# Patient Record
Sex: Female | Born: 1970 | Race: White | Hispanic: No | Smoking: Current every day smoker
Health system: Southern US, Community
[De-identification: ages and names within clinical notes are randomized; demographics above are authoritative.]

## PROBLEM LIST (undated history)

## (undated) DIAGNOSIS — K859 Acute pancreatitis without necrosis or infection, unspecified: Secondary | ICD-10-CM

## (undated) DIAGNOSIS — K259 Gastric ulcer, unspecified as acute or chronic, without hemorrhage or perforation: Secondary | ICD-10-CM

## (undated) DIAGNOSIS — F329 Major depressive disorder, single episode, unspecified: Secondary | ICD-10-CM

## (undated) DIAGNOSIS — F1010 Alcohol abuse, uncomplicated: Secondary | ICD-10-CM

## (undated) DIAGNOSIS — K219 Gastro-esophageal reflux disease without esophagitis: Secondary | ICD-10-CM

## (undated) DIAGNOSIS — K7689 Other specified diseases of liver: Secondary | ICD-10-CM

## (undated) DIAGNOSIS — D62 Acute posthemorrhagic anemia: Secondary | ICD-10-CM

## (undated) DIAGNOSIS — F32A Depression, unspecified: Secondary | ICD-10-CM

## (undated) DIAGNOSIS — IMO0001 Reserved for inherently not codable concepts without codable children: Secondary | ICD-10-CM

## (undated) DIAGNOSIS — K7010 Alcoholic hepatitis without ascites: Secondary | ICD-10-CM

## (undated) DIAGNOSIS — Z9884 Bariatric surgery status: Secondary | ICD-10-CM

## (undated) HISTORY — PX: GASTRIC BYPASS: SHX52

---

## 2008-03-08 ENCOUNTER — Other Ambulatory Visit: Admission: RE | Admit: 2008-03-08 | Discharge: 2008-03-08 | Payer: Self-pay | Admitting: Obstetrics & Gynecology

## 2008-03-30 ENCOUNTER — Ambulatory Visit (HOSPITAL_COMMUNITY): Admission: RE | Admit: 2008-03-30 | Discharge: 2008-03-30 | Payer: Self-pay | Admitting: Obstetrics & Gynecology

## 2008-04-25 ENCOUNTER — Ambulatory Visit (HOSPITAL_COMMUNITY): Admission: RE | Admit: 2008-04-25 | Discharge: 2008-04-25 | Payer: Self-pay | Admitting: Obstetrics & Gynecology

## 2008-05-05 ENCOUNTER — Ambulatory Visit (HOSPITAL_COMMUNITY): Admission: RE | Admit: 2008-05-05 | Discharge: 2008-05-05 | Payer: Self-pay | Admitting: Obstetrics & Gynecology

## 2008-06-16 ENCOUNTER — Ambulatory Visit (HOSPITAL_COMMUNITY): Admission: RE | Admit: 2008-06-16 | Discharge: 2008-06-16 | Payer: Self-pay | Admitting: Obstetrics & Gynecology

## 2009-07-12 ENCOUNTER — Emergency Department (HOSPITAL_COMMUNITY): Admission: EM | Admit: 2009-07-12 | Discharge: 2009-07-12 | Payer: Self-pay | Admitting: Emergency Medicine

## 2010-01-19 ENCOUNTER — Inpatient Hospital Stay (HOSPITAL_COMMUNITY): Admission: EM | Admit: 2010-01-19 | Discharge: 2010-01-22 | Payer: Self-pay | Admitting: Pediatrics

## 2010-02-21 ENCOUNTER — Inpatient Hospital Stay (HOSPITAL_COMMUNITY): Admission: EM | Admit: 2010-02-21 | Discharge: 2010-02-26 | Payer: Self-pay | Admitting: Emergency Medicine

## 2010-04-10 ENCOUNTER — Emergency Department (HOSPITAL_COMMUNITY): Admission: EM | Admit: 2010-04-10 | Discharge: 2010-04-10 | Payer: Self-pay | Admitting: Emergency Medicine

## 2010-06-09 ENCOUNTER — Inpatient Hospital Stay (HOSPITAL_COMMUNITY): Admission: EM | Admit: 2010-06-09 | Discharge: 2010-06-12 | Payer: Self-pay | Admitting: Emergency Medicine

## 2010-08-05 ENCOUNTER — Encounter: Payer: Self-pay | Admitting: Obstetrics & Gynecology

## 2010-09-25 LAB — LIPASE, BLOOD
Lipase: 194 U/L — ABNORMAL HIGH (ref 11–59)
Lipase: 505 U/L — ABNORMAL HIGH (ref 11–59)

## 2010-09-25 LAB — COMPREHENSIVE METABOLIC PANEL
ALT: 23 U/L (ref 0–35)
ALT: 36 U/L — ABNORMAL HIGH (ref 0–35)
ALT: 50 U/L — ABNORMAL HIGH (ref 0–35)
AST: 154 U/L — ABNORMAL HIGH (ref 0–37)
AST: 23 U/L (ref 0–37)
Albumin: 2.7 g/dL — ABNORMAL LOW (ref 3.5–5.2)
Alkaline Phosphatase: 65 U/L (ref 39–117)
Alkaline Phosphatase: 84 U/L (ref 39–117)
BUN: 9 mg/dL (ref 6–23)
CO2: 23 mEq/L (ref 19–32)
CO2: 24 mEq/L (ref 19–32)
Calcium: 8.4 mg/dL (ref 8.4–10.5)
Chloride: 101 mEq/L (ref 96–112)
Chloride: 105 mEq/L (ref 96–112)
Creatinine, Ser: 0.63 mg/dL (ref 0.4–1.2)
GFR calc Af Amer: >60 mL/min (ref >60)
GFR calc Af Amer: >60 mL/min (ref >60)
GFR calc non Af Amer: >60 mL/min (ref >60)
Glucose, Bld: 136 mg/dL — ABNORMAL HIGH (ref 70–99)
Glucose, Bld: 66 mg/dL — ABNORMAL LOW (ref 70–99)
Potassium: 3.3 mEq/L — ABNORMAL LOW (ref 3.5–5.1)
Potassium: 3.8 mEq/L (ref 3.5–5.1)
Sodium: 135 mEq/L (ref 135–145)
Sodium: 136 mEq/L (ref 135–145)
Sodium: 141 mEq/L (ref 135–145)
Total Bilirubin: 0.4 mg/dL (ref 0.3–1.2)
Total Bilirubin: 0.6 mg/dL (ref 0.3–1.2)
Total Protein: 5.5 g/dL — ABNORMAL LOW (ref 6.0–8.3)
Total Protein: 7.6 g/dL (ref 6.0–8.3)

## 2010-09-25 LAB — URINALYSIS, ROUTINE W REFLEX MICROSCOPIC
Bilirubin Urine: NEGATIVE
Glucose, UA: NEGATIVE mg/dL
Glucose, UA: NEGATIVE mg/dL
Ketones, ur: 15 mg/dL — AB
Leukocytes, UA: NEGATIVE
Nitrite: POSITIVE — AB
Protein, ur: NEGATIVE mg/dL
Specific Gravity, Urine: >1.03 — ABNORMAL HIGH (ref 1.005–1.030)
pH: 6 (ref 5.0–8.0)
pH: 6 (ref 5.0–8.0)

## 2010-09-25 LAB — MRSA PCR SCREENING: MRSA by PCR: NEGATIVE

## 2010-09-25 LAB — DIFFERENTIAL
Basophils Absolute: 0 10*3/uL (ref 0.0–0.1)
Basophils Absolute: 0 10*3/uL (ref 0.0–0.1)
Basophils Relative: 0 % (ref 0–1)
Basophils Relative: 1 % (ref 0–1)
Eosinophils Absolute: 0 10*3/uL (ref 0.0–0.7)
Eosinophils Absolute: 0.2 10*3/uL (ref 0.0–0.7)
Eosinophils Relative: 3 % (ref 0–5)
Lymphs Abs: 1.5 10*3/uL (ref 0.7–4.0)
Monocytes Absolute: 0.7 10*3/uL (ref 0.1–1.0)
Monocytes Relative: 7 % (ref 3–12)
Neutro Abs: 9 10*3/uL — ABNORMAL HIGH (ref 1.7–7.7)
Neutrophils Relative %: 86 % — ABNORMAL HIGH (ref 43–77)

## 2010-09-25 LAB — CBC
HCT: 37.4 % (ref 36.0–46.0)
HCT: 39.8 % (ref 36.0–46.0)
Hemoglobin: 12.3 g/dL (ref 12.0–15.0)
Hemoglobin: 13.7 g/dL (ref 12.0–15.0)
MCV: 87.1 fL (ref 78.0–100.0)
RBC: 4.18 MIL/uL (ref 3.87–5.11)
RBC: 4.57 MIL/uL (ref 3.87–5.11)
WBC: 10.5 10*3/uL (ref 4.0–10.5)

## 2010-09-25 LAB — URINE MICROSCOPIC-ADD ON

## 2010-09-25 LAB — RAPID URINE DRUG SCREEN, HOSP PERFORMED
Cocaine: NOT DETECTED
Tetrahydrocannabinol: NOT DETECTED

## 2010-09-27 LAB — DIFFERENTIAL
Eosinophils Relative: 1 % (ref 0–5)
Lymphs Abs: 2.1 10*3/uL (ref 0.7–4.0)
Monocytes Absolute: 0.4 10*3/uL (ref 0.1–1.0)

## 2010-09-27 LAB — CBC
HCT: 35.1 % — ABNORMAL LOW (ref 36.0–46.0)
MCHC: 33 g/dL (ref 30.0–36.0)
Platelets: 522 10*3/uL — ABNORMAL HIGH (ref 150–400)
RDW: 23.1 % — ABNORMAL HIGH (ref 11.5–15.5)

## 2010-09-27 LAB — RAPID URINE DRUG SCREEN, HOSP PERFORMED
Amphetamines: NOT DETECTED
Benzodiazepines: NOT DETECTED
Cocaine: NOT DETECTED
Opiates: NOT DETECTED
Tetrahydrocannabinol: POSITIVE — AB

## 2010-09-27 LAB — BASIC METABOLIC PANEL
BUN: 3 mg/dL — ABNORMAL LOW (ref 6–23)
Calcium: 8.7 mg/dL (ref 8.4–10.5)
Creatinine, Ser: 0.66 mg/dL (ref 0.4–1.2)
GFR calc non Af Amer: >60 mL/min (ref >60)
Glucose, Bld: 88 mg/dL (ref 70–99)
Potassium: 3.3 mEq/L — ABNORMAL LOW (ref 3.5–5.1)

## 2010-09-28 LAB — DIFFERENTIAL
Basophils Absolute: 0 K/uL (ref 0.0–0.1)
Basophils Absolute: 0 10*3/uL (ref 0.0–0.1)
Basophils Absolute: 0.1 10*3/uL (ref 0.0–0.1)
Basophils Relative: 0 % (ref 0–1)
Eosinophils Absolute: 0.3 K/uL (ref 0.0–0.7)
Eosinophils Absolute: 0.2 10*3/uL (ref 0.0–0.7)
Eosinophils Absolute: 0.3 10*3/uL (ref 0.0–0.7)
Eosinophils Relative: 3 % (ref 0–5)
Eosinophils Relative: 1 % (ref 0–5)
Eosinophils Relative: 3 % (ref 0–5)
Eosinophils Relative: 4 % (ref 0–5)
Lymphocytes Relative: 25 % (ref 12–46)
Lymphocytes Relative: 22 % (ref 12–46)
Lymphocytes Relative: 7 % — ABNORMAL LOW (ref 12–46)
Lymphs Abs: 2.1 K/uL (ref 0.7–4.0)
Lymphs Abs: 2 10*3/uL (ref 0.7–4.0)
Monocytes Absolute: 0.8 K/uL (ref 0.1–1.0)
Monocytes Absolute: 1.1 10*3/uL — ABNORMAL HIGH (ref 0.1–1.0)
Monocytes Relative: 10 % (ref 3–12)
Monocytes Relative: 10 % (ref 3–12)
Monocytes Relative: 10 % (ref 3–12)
Neutro Abs: 5.3 K/uL (ref 1.7–7.7)
Neutrophils Relative %: 62 % (ref 43–77)

## 2010-09-28 LAB — BASIC METABOLIC PANEL
BUN: 4 mg/dL — ABNORMAL LOW (ref 6–23)
BUN: 5 mg/dL — ABNORMAL LOW (ref 6–23)
CO2: 26 mEq/L (ref 19–32)
CO2: 23 mEq/L (ref 19–32)
CO2: 25 mEq/L (ref 19–32)
Calcium: 8.2 mg/dL — ABNORMAL LOW (ref 8.4–10.5)
Chloride: 106 mEq/L (ref 96–112)
Chloride: 105 mEq/L (ref 96–112)
Chloride: 105 mEq/L (ref 96–112)
Creatinine, Ser: 0.68 mg/dL (ref 0.4–1.2)
Creatinine, Ser: 0.53 mg/dL (ref 0.4–1.2)
Creatinine, Ser: 0.53 mg/dL (ref 0.4–1.2)
GFR calc Af Amer: >60 mL/min (ref >60)
GFR calc Af Amer: >60 mL/min (ref >60)
GFR calc non Af Amer: >60 mL/min (ref >60)
Glucose, Bld: 72 mg/dL (ref 70–99)
Glucose, Bld: 84 mg/dL (ref 70–99)
Potassium: 3.7 mEq/L (ref 3.5–5.1)
Sodium: 136 mEq/L (ref 135–145)
Sodium: 137 mEq/L (ref 135–145)

## 2010-09-28 LAB — CROSSMATCH: Antibody Screen: NEGATIVE

## 2010-09-28 LAB — CBC
HCT: 31.5 % — ABNORMAL LOW (ref 36.0–46.0)
HCT: 24.9 % — ABNORMAL LOW (ref 36.0–46.0)
HCT: 31.6 % — ABNORMAL LOW (ref 36.0–46.0)
HCT: 32.1 % — ABNORMAL LOW (ref 36.0–46.0)
Hemoglobin: 10.2 g/dL — ABNORMAL LOW (ref 12.0–15.0)
Hemoglobin: 10.2 g/dL — ABNORMAL LOW (ref 12.0–15.0)
Hemoglobin: 8 g/dL — ABNORMAL LOW (ref 12.0–15.0)
MCH: 27.6 pg (ref 26.0–34.0)
MCH: 26.4 pg (ref 26.0–34.0)
MCH: 27.4 pg (ref 26.0–34.0)
MCHC: 32.5 g/dL (ref 30.0–36.0)
MCHC: 32 g/dL (ref 30.0–36.0)
MCV: 84.8 fL (ref 78.0–100.0)
MCV: 82.7 fL (ref 78.0–100.0)
MCV: 82.8 fL (ref 78.0–100.0)
MCV: 84.7 fL (ref 78.0–100.0)
Platelets: 987 K/uL (ref 150–400)
Platelets: 529 K/uL — ABNORMAL HIGH (ref 150–400)
RBC: 3.72 MIL/uL — ABNORMAL LOW (ref 3.87–5.11)
RBC: 3.01 MIL/uL — ABNORMAL LOW (ref 3.87–5.11)
RBC: 3.73 MIL/uL — ABNORMAL LOW (ref 3.87–5.11)
RDW: 23 % — ABNORMAL HIGH (ref 11.5–15.5)
RDW: 23 % — ABNORMAL HIGH (ref 11.5–15.5)
RDW: 23.3 % — ABNORMAL HIGH (ref 11.5–15.5)
WBC: 8.6 K/uL (ref 4.0–10.5)
WBC: 11.8 10*3/uL — ABNORMAL HIGH (ref 4.0–10.5)
WBC: 8.7 K/uL (ref 4.0–10.5)
WBC: 8.9 10*3/uL (ref 4.0–10.5)

## 2010-09-28 LAB — URINE MICROSCOPIC-ADD ON

## 2010-09-28 LAB — URINALYSIS, ROUTINE W REFLEX MICROSCOPIC
Hgb urine dipstick: NEGATIVE
Urobilinogen, UA: >8 mg/dL — ABNORMAL HIGH (ref 0.0–1.0)

## 2010-09-28 LAB — HEPATIC FUNCTION PANEL
ALT: 16 U/L (ref 0–35)
AST: 33 U/L (ref 0–37)
Albumin: 2.6 g/dL — ABNORMAL LOW (ref 3.5–5.2)
Alkaline Phosphatase: 105 U/L (ref 39–117)
Bilirubin, Direct: 0.2 mg/dL (ref 0.0–0.3)
Indirect Bilirubin: 0.2 mg/dL — ABNORMAL LOW (ref 0.3–0.9)
Total Bilirubin: 0.4 mg/dL (ref 0.3–1.2)
Total Protein: 7.2 g/dL (ref 6.0–8.3)

## 2010-09-28 LAB — AMYLASE: Amylase: 291 U/L — ABNORMAL HIGH (ref 0–105)

## 2010-09-28 LAB — MAGNESIUM: Magnesium: 1.9 mg/dL (ref 1.5–2.5)

## 2010-09-28 LAB — BASIC METABOLIC PANEL WITH GFR
BUN: 5 mg/dL — ABNORMAL LOW (ref 6–23)
CO2: 25 meq/L (ref 19–32)
Calcium: 8.4 mg/dL (ref 8.4–10.5)
Chloride: 101 meq/L (ref 96–112)
Creatinine, Ser: 0.61 mg/dL (ref 0.4–1.2)
GFR calc non Af Amer: >60 mL/min
Glucose, Bld: 102 mg/dL — ABNORMAL HIGH (ref 70–99)
Potassium: 3.3 meq/L — ABNORMAL LOW (ref 3.5–5.1)
Sodium: 135 meq/L (ref 135–145)

## 2010-09-28 LAB — HEMOCCULT GUIAC POC 1CARD (OFFICE)
Fecal Occult Bld: NEGATIVE
Fecal Occult Bld: NEGATIVE

## 2010-09-28 LAB — HEMOGLOBIN AND HEMATOCRIT, BLOOD
HCT: 25.5 % — ABNORMAL LOW (ref 36.0–46.0)
Hemoglobin: 8.2 g/dL — ABNORMAL LOW (ref 12.0–15.0)

## 2010-09-28 LAB — PREGNANCY, URINE: Preg Test, Ur: NEGATIVE

## 2010-09-28 LAB — IRON AND TIBC: Saturation Ratios: 5 % — ABNORMAL LOW (ref 20–55)

## 2010-09-28 LAB — PATHOLOGIST SMEAR REVIEW

## 2010-09-28 LAB — LIPASE, BLOOD
Lipase: 355 U/L — ABNORMAL HIGH (ref 11–59)
Lipase: 184 U/L — ABNORMAL HIGH (ref 11–59)

## 2010-09-30 LAB — CBC
HCT: 26.9 % — ABNORMAL LOW (ref 36.0–46.0)
HCT: 33 % — ABNORMAL LOW (ref 36.0–46.0)
HCT: 33.3 % — ABNORMAL LOW (ref 36.0–46.0)
HCT: 34.5 % — ABNORMAL LOW (ref 36.0–46.0)
Hemoglobin: 10.8 g/dL — ABNORMAL LOW (ref 12.0–15.0)
Hemoglobin: 10.8 g/dL — ABNORMAL LOW (ref 12.0–15.0)
Hemoglobin: 8.9 g/dL — ABNORMAL LOW (ref 12.0–15.0)
MCH: 26.8 pg (ref 26.0–34.0)
MCH: 26.9 pg (ref 26.0–34.0)
MCH: 27 pg (ref 26.0–34.0)
MCHC: 32.6 g/dL (ref 30.0–36.0)
MCHC: 32.6 g/dL (ref 30.0–36.0)
MCHC: 32.9 g/dL (ref 30.0–36.0)
MCV: 82.1 fL (ref 78.0–100.0)
MCV: 82.2 fL (ref 78.0–100.0)
MCV: 82.6 fL (ref 78.0–100.0)
Platelets: 226 10*3/uL (ref 150–400)
Platelets: 245 10*3/uL (ref 150–400)
RBC: 3.28 MIL/uL — ABNORMAL LOW (ref 3.87–5.11)
RBC: 4.03 MIL/uL (ref 3.87–5.11)
RDW: 23.2 % — ABNORMAL HIGH (ref 11.5–15.5)
RDW: 23.5 % — ABNORMAL HIGH (ref 11.5–15.5)
RDW: 23.7 % — ABNORMAL HIGH (ref 11.5–15.5)
WBC: 10.5 10*3/uL (ref 4.0–10.5)
WBC: 14.1 10*3/uL — ABNORMAL HIGH (ref 4.0–10.5)

## 2010-09-30 LAB — TRIGLYCERIDES: Triglycerides: 58 mg/dL (ref <150)

## 2010-09-30 LAB — GLUCOSE, CAPILLARY
Glucose-Capillary: 106 mg/dL — ABNORMAL HIGH (ref 70–99)
Glucose-Capillary: 108 mg/dL — ABNORMAL HIGH (ref 70–99)
Glucose-Capillary: 75 mg/dL (ref 70–99)

## 2010-09-30 LAB — COMPREHENSIVE METABOLIC PANEL
ALT: 10 U/L (ref 0–35)
AST: 18 U/L (ref 0–37)
Albumin: 3.3 g/dL — ABNORMAL LOW (ref 3.5–5.2)
Alkaline Phosphatase: 64 U/L (ref 39–117)
Alkaline Phosphatase: 71 U/L (ref 39–117)
Alkaline Phosphatase: 77 U/L (ref 39–117)
BUN: 4 mg/dL — ABNORMAL LOW (ref 6–23)
BUN: 7 mg/dL (ref 6–23)
CO2: 18 mEq/L — ABNORMAL LOW (ref 19–32)
CO2: 20 mEq/L (ref 19–32)
Calcium: 8 mg/dL — ABNORMAL LOW (ref 8.4–10.5)
Calcium: 8.3 mg/dL — ABNORMAL LOW (ref 8.4–10.5)
Chloride: 107 mEq/L (ref 96–112)
Creatinine, Ser: 0.65 mg/dL (ref 0.4–1.2)
Creatinine, Ser: 0.72 mg/dL (ref 0.4–1.2)
GFR calc Af Amer: >60 mL/min (ref >60)
GFR calc non Af Amer: >60 mL/min (ref >60)
Glucose, Bld: 135 mg/dL — ABNORMAL HIGH (ref 70–99)
Glucose, Bld: 194 mg/dL — ABNORMAL HIGH (ref 70–99)
Glucose, Bld: 88 mg/dL (ref 70–99)
Potassium: 2.9 mEq/L — ABNORMAL LOW (ref 3.5–5.1)
Sodium: 134 mEq/L — ABNORMAL LOW (ref 135–145)
Total Bilirubin: 0.9 mg/dL (ref 0.3–1.2)
Total Protein: 6.3 g/dL (ref 6.0–8.3)
Total Protein: 6.9 g/dL (ref 6.0–8.3)

## 2010-09-30 LAB — BASIC METABOLIC PANEL
BUN: 4 mg/dL — ABNORMAL LOW (ref 6–23)
BUN: 5 mg/dL — ABNORMAL LOW (ref 6–23)
CO2: 20 mEq/L (ref 19–32)
CO2: 20 mEq/L (ref 19–32)
Calcium: 7.9 mg/dL — ABNORMAL LOW (ref 8.4–10.5)
Calcium: 8.3 mg/dL — ABNORMAL LOW (ref 8.4–10.5)
Chloride: 105 mEq/L (ref 96–112)
Chloride: 105 mEq/L (ref 96–112)
Creatinine, Ser: 0.65 mg/dL (ref 0.4–1.2)
Creatinine, Ser: 0.66 mg/dL (ref 0.4–1.2)
GFR calc Af Amer: >60 mL/min (ref >60)
GFR calc Af Amer: >60 mL/min (ref >60)
GFR calc non Af Amer: >60 mL/min (ref >60)
GFR calc non Af Amer: >60 mL/min (ref >60)
Glucose, Bld: 66 mg/dL — ABNORMAL LOW (ref 70–99)
Glucose, Bld: 86 mg/dL (ref 70–99)
Potassium: 4.1 mEq/L (ref 3.5–5.1)
Potassium: 4.5 mEq/L (ref 3.5–5.1)
Sodium: 130 mEq/L — ABNORMAL LOW (ref 135–145)
Sodium: 131 mEq/L — ABNORMAL LOW (ref 135–145)

## 2010-09-30 LAB — DIFFERENTIAL
Basophils Absolute: 0 10*3/uL (ref 0.0–0.1)
Basophils Absolute: 0 10*3/uL (ref 0.0–0.1)
Basophils Relative: 0 % (ref 0–1)
Basophils Relative: 0 % (ref 0–1)
Basophils Relative: 0 % (ref 0–1)
Eosinophils Absolute: 0 10*3/uL (ref 0.0–0.7)
Eosinophils Absolute: 0 10*3/uL (ref 0.0–0.7)
Eosinophils Absolute: 0 10*3/uL (ref 0.0–0.7)
Eosinophils Relative: 0 % (ref 0–5)
Eosinophils Relative: 0 % (ref 0–5)
Lymphocytes Relative: 10 % — ABNORMAL LOW (ref 12–46)
Lymphocytes Relative: 3 % — ABNORMAL LOW (ref 12–46)
Lymphocytes Relative: 4 % — ABNORMAL LOW (ref 12–46)
Lymphs Abs: 0.6 10*3/uL — ABNORMAL LOW (ref 0.7–4.0)
Lymphs Abs: 1 10*3/uL (ref 0.7–4.0)
Lymphs Abs: 1 10*3/uL (ref 0.7–4.0)
Monocytes Absolute: 0.6 10*3/uL (ref 0.1–1.0)
Monocytes Absolute: 0.9 10*3/uL (ref 0.1–1.0)
Monocytes Relative: 4 % (ref 3–12)
Monocytes Relative: 5 % (ref 3–12)
Monocytes Relative: 6 % (ref 3–12)
Monocytes Relative: 9 % (ref 3–12)
Neutro Abs: 12.1 10*3/uL — ABNORMAL HIGH (ref 1.7–7.7)
Neutro Abs: 8.5 10*3/uL — ABNORMAL HIGH (ref 1.7–7.7)
Neutrophils Relative %: 81 % — ABNORMAL HIGH (ref 43–77)
Neutrophils Relative %: 89 % — ABNORMAL HIGH (ref 43–77)
Neutrophils Relative %: 92 % — ABNORMAL HIGH (ref 43–77)

## 2010-09-30 LAB — RAPID URINE DRUG SCREEN, HOSP PERFORMED
Amphetamines: NOT DETECTED
Barbiturates: NOT DETECTED
Benzodiazepines: POSITIVE — AB
Cocaine: NOT DETECTED
Opiates: POSITIVE — AB
Tetrahydrocannabinol: NOT DETECTED

## 2010-09-30 LAB — LIPASE, BLOOD
Lipase: 134 U/L — ABNORMAL HIGH (ref 11–59)
Lipase: 665 U/L — ABNORMAL HIGH (ref 11–59)
Lipase: 82 U/L — ABNORMAL HIGH (ref 11–59)

## 2010-09-30 LAB — TSH: TSH: 1.751 u[IU]/mL (ref 0.350–4.500)

## 2010-10-15 LAB — RAPID URINE DRUG SCREEN, HOSP PERFORMED
Amphetamines: NOT DETECTED
Benzodiazepines: NOT DETECTED
Cocaine: NOT DETECTED

## 2010-10-15 LAB — BASIC METABOLIC PANEL
BUN: 6 mg/dL (ref 6–23)
CO2: 24 mEq/L (ref 19–32)
Calcium: 9 mg/dL (ref 8.4–10.5)
Chloride: 102 mEq/L (ref 96–112)
Creatinine, Ser: 0.83 mg/dL (ref 0.4–1.2)
GFR calc Af Amer: >60 mL/min (ref >60)

## 2010-10-15 LAB — ETHANOL: Alcohol, Ethyl (B): <5 mg/dL (ref 0–10)

## 2010-10-15 LAB — DIFFERENTIAL
Basophils Relative: 1 % (ref 0–1)
Eosinophils Absolute: 0 10*3/uL (ref 0.0–0.7)
Monocytes Relative: 10 % (ref 3–12)
Neutrophils Relative %: 65 % (ref 43–77)

## 2010-10-15 LAB — HEPATIC FUNCTION PANEL
Albumin: 3.7 g/dL (ref 3.5–5.2)
Bilirubin, Direct: 0.1 mg/dL (ref 0.0–0.3)
Total Bilirubin: 0.6 mg/dL (ref 0.3–1.2)

## 2010-10-15 LAB — CBC
MCHC: 32.2 g/dL (ref 30.0–36.0)
MCV: 75.3 fL — ABNORMAL LOW (ref 78.0–100.0)
RBC: 4.21 MIL/uL (ref 3.87–5.11)
RDW: 19 % — ABNORMAL HIGH (ref 11.5–15.5)

## 2010-10-15 LAB — PREGNANCY, URINE: Preg Test, Ur: NEGATIVE

## 2013-03-10 ENCOUNTER — Emergency Department (HOSPITAL_COMMUNITY): Payer: Self-pay

## 2013-03-10 ENCOUNTER — Encounter (HOSPITAL_COMMUNITY): Payer: Self-pay | Admitting: Emergency Medicine

## 2013-03-10 ENCOUNTER — Emergency Department (HOSPITAL_COMMUNITY)
Admission: EM | Admit: 2013-03-10 | Discharge: 2013-03-11 | Disposition: A | Discharge status: Home / Self Care | Payer: Self-pay | Attending: Emergency Medicine | Admitting: Emergency Medicine

## 2013-03-10 DIAGNOSIS — L02811 Cutaneous abscess of head [any part, except face]: Secondary | ICD-10-CM

## 2013-03-10 DIAGNOSIS — Z79899 Other long term (current) drug therapy: Secondary | ICD-10-CM | POA: Insufficient documentation

## 2013-03-10 DIAGNOSIS — R209 Unspecified disturbances of skin sensation: Secondary | ICD-10-CM | POA: Insufficient documentation

## 2013-03-10 DIAGNOSIS — S52023A Displaced fracture of olecranon process without intraarticular extension of unspecified ulna, initial encounter for closed fracture: Secondary | ICD-10-CM | POA: Insufficient documentation

## 2013-03-10 DIAGNOSIS — S52022A Displaced fracture of olecranon process without intraarticular extension of left ulna, initial encounter for closed fracture: Secondary | ICD-10-CM

## 2013-03-10 DIAGNOSIS — Y9289 Other specified places as the place of occurrence of the external cause: Secondary | ICD-10-CM | POA: Insufficient documentation

## 2013-03-10 DIAGNOSIS — W1809XA Striking against other object with subsequent fall, initial encounter: Secondary | ICD-10-CM | POA: Insufficient documentation

## 2013-03-10 DIAGNOSIS — S0100XA Unspecified open wound of scalp, initial encounter: Secondary | ICD-10-CM | POA: Insufficient documentation

## 2013-03-10 DIAGNOSIS — Y93E1 Activity, personal bathing and showering: Secondary | ICD-10-CM | POA: Insufficient documentation

## 2013-03-10 DIAGNOSIS — L02818 Cutaneous abscess of other sites: Secondary | ICD-10-CM | POA: Insufficient documentation

## 2013-03-10 DIAGNOSIS — F17200 Nicotine dependence, unspecified, uncomplicated: Secondary | ICD-10-CM | POA: Insufficient documentation

## 2013-03-10 HISTORY — DX: Bariatric surgery status: Z98.84

## 2013-03-10 MED ORDER — OXYCODONE-ACETAMINOPHEN 5-325 MG PO TABS: 1.0000 | ORAL_TABLET | Freq: Once | ORAL | Status: DC

## 2013-03-10 MED ORDER — LIDOCAINE-EPINEPHRINE (PF) 2 %-1:200000 IJ SOLN
INTRAMUSCULAR | Status: AC
  Filled 2013-03-10: qty 20

## 2013-03-10 MED ORDER — ACETAMINOPHEN-CODEINE #3 300-30 MG PO TABS
1.0000 | ORAL_TABLET | Freq: Once | ORAL | Status: AC
  Administered 2013-03-11: 1 via ORAL
  Filled 2013-03-10: qty 1

## 2013-03-10 MED ORDER — SULFAMETHOXAZOLE-TRIMETHOPRIM 800-160 MG PO TABS: 2.0000 | ORAL_TABLET | Freq: Two times a day (BID) | ORAL | Status: DC

## 2013-03-10 MED ORDER — ACETAMINOPHEN-CODEINE #3 300-30 MG PO TABS: 1.0000 | ORAL_TABLET | Freq: Four times a day (QID) | ORAL | Status: DC | PRN

## 2013-03-10 MED ORDER — OXYCODONE-ACETAMINOPHEN 5-325 MG PO TABS: 1.0000 | ORAL_TABLET | Freq: Four times a day (QID) | ORAL | Status: DC | PRN

## 2013-03-10 MED ORDER — CEPHALEXIN 500 MG PO CAPS: 500.0000 mg | ORAL_CAPSULE | Freq: Four times a day (QID) | ORAL | Status: DC

## 2013-03-10 NOTE — ED Notes (Signed)
Pt states she fell 3 weeks ago on her left arm and hit her head. Pt states she had loc after fall.

## 2013-03-10 NOTE — ED Notes (Signed)
Pt c/o left elbow pain and left side head pain x3 weeks. Pt states she fell 3 weeks ago. Pt reports LOC after fall. Pt also reports intermittent dizziness since fall. Pt denies nausea, weakness, lightheadedness.

## 2013-03-10 NOTE — ED Provider Notes (Signed)
CSN: [id number]     Arrival date & time 03/10/13  1959 History  This chart was scribed for [employer]. Rubin Payor, MD by Dorothey Baseman, ED Scribe. This patient was seen in room APA12/[facility]12 and the patient's care was started at 9:55 PM.    Chief Complaint  Patient presents with  . Headache  . Arm Pain   The history is provided by the patient. No language interpreter was used.   HPI Comments: Jennifer [name] is a 42 y.o. female who presents to the Emergency Department complaining of left, lower arm pain that is gradually worsening and described as aching, secondary to a fall that occurred 3 weeks ago. Patient reports the fall occurred while she was in the shower and had been drinking. Patient reports associated paresthesias of her left lower arm and a cut to her head that has been draining onset after the fall. Patient denies numbness in her left hand. Patient denies any relevant medical history. Patient is a current every day smoker.  Past Medical History  Diagnosis Date  . Gastric bypass status for obesity    Past Surgical History  Procedure Laterality Date  . Gastric bypass     History reviewed. No pertinent family history. History  Substance Use Topics  . Smoking status: Current Every Day Smoker  . Smokeless tobacco: Not on file  . Alcohol Use: Yes   OB History   Grav Para Term Preterm Abortions TAB SAB Ect Mult Living                 Review of Systems  Constitutional: Negative for fever.  Musculoskeletal: Positive for myalgias.       Left lower arm.  Skin: Positive for wound.       Left parietal scalp.   Neurological: Negative for dizziness, syncope and numbness.  Psychiatric/Behavioral: Negative for confusion.  All other systems reviewed and are negative.    Allergies  Aspirin  Home Medications   Current Outpatient Rx  Name  Route  Sig  Dispense  Refill  . acetaminophen-codeine (TYLENOL #3) 300-30 MG per tablet   Oral   Take 1-2 tablets by mouth every 6 (six) hours  as needed for pain.   15 tablet   0   . cephALEXin (KEFLEX) 500 MG capsule   Oral   Take 1 capsule (500 mg total) by mouth 4 (four) times daily.   20 capsule   0   . oxyCODONE-acetaminophen (PERCOCET/ROXICET) 5-325 MG per tablet   Oral   Take 1-2 tablets by mouth every 6 (six) hours as needed for pain.   10 tablet   0   . sulfamethoxazole-trimethoprim (BACTRIM DS,SEPTRA DS) 800-160 MG per tablet   Oral   Take 2 tablets by mouth 2 (two) times daily.   20 tablet   0     Triage Vitals: BP 112/52  Pulse 71  Temp(Src) 98.2 F (36.8 C) (Oral)  Resp 20  Ht 5\' 8"  (1.727 m)  Wt 162 lb (73.483 kg)  BMI 24.64 kg/m2  SpO2 100%  Physical Exam  Nursing note and vitals reviewed. Constitutional: She is oriented to person, place, and time. She appears well-developed and well-nourished. No distress.  HENT:  Head: Normocephalic.  1.5 cm x 4 cm raised area to the left parietal with purulent drainage when pressure is applied. No erythema. No tenderness.  Eyes: Conjunctivae are normal.  Neck: Normal range of motion.  Cardiovascular: Normal rate, regular rhythm and normal heart sounds.  Pulmonary/Chest: Effort normal.  Musculoskeletal: She exhibits tenderness.  Tenderness to palpation and limited ROM of the left elbow.  Neurological: She is alert and oriented to person, place, and time.  Skin: Skin is warm and dry.  Psychiatric: She has a normal mood and affect. Her behavior is normal.    ED Course  Procedures (including critical care time)   COORDINATION OF CARE: 10:04PM- Applied pressure to area on scalp to facilitate pus drainage. Will order elbow x-ray and head CT scan. Discussed treatment plan with patient at bedside and patient verbalized agreement.   Labs Review Labs Reviewed - No data to display  Imaging Review  Dg Elbow Complete Left  03/10/2013   *RADIOLOGY REPORT*  Clinical Data: Traumatic injury and pain 3 weeks previous  LEFT ELBOW - COMPLETE 3+ VIEW  Comparison:  None.  Findings: There is a mildly displaced fracture through the olecranon process.  No dislocation is noted.  A few small fragments are noted medial to the fracture site. The joint effusion is noted.  IMPRESSION: Olecranon process fracture   Original Report Authenticated By: Alcide Clever, M.D.   Ct Head Wo Contrast  03/10/2013   *RADIOLOGY REPORT*  Clinical Data: Headache, post remote fall, now with draining wound  CT HEAD WITHOUT CONTRAST  Technique:  Contiguous axial images were obtained from the base of the skull through the vertex without contrast.  Comparison: None.  Findings:  There is an approximately 1.1 x 3.6 cm area of soft tissue stranding about the left parietal calvarium (image 21, series 2). There is minimal ill-defined decreased attenuation of the central aspect of the soft tissue stranding (representative images 21 and 19) which may represent fluid.  There is no definitive osteolysis of the adjacent calvarium.  There are two adjacent punctate radiopaque foci about the caudal aspect of this soft tissue stranding (image 18, series 2) which may represent foreign bodies.  Gray white differentiation is maintained.  No CT evidence of acute large territory infarct.  No intraparenchymal or extra-axial mass or hemorrhage.  Normal size and configuration of the ventricles and basilar cisterns.  No midline shift.  Limited visualization of the paranasal sinuses and mastoid air cells are normal.  No displaced calvarial fracture.  IMPRESSION: Soft tissue stranding with central decreased attenuation about the left parietal calvarium which given provided history of draining wound is worrisome for area of infection.  Additionally, there are two punctate radiopaque foreign bodies within the caudal aspect of the soft tissue stranding may represent radiopaque foreign bodies. No evidence of osteomyelitis or acute intracranial process.   Original Report Authenticated By: Tacey Ruiz, MD   INCISION AND  DRAINAGE Performed by: Billee Cashing Consent: Verbal consent obtained. Risks and benefits: risks, benefits and alternatives were discussed Type: abscess  Body area: Scalp  Anesthesia: local infiltration  Incision was made with a scalpel.  Local anesthetic: lidocaine 2 % with epinephrine  Anesthetic total: 2 and ml  Complexity: Simple   Drainage: None   Patient tolerance: Patient tolerated the procedure well with no immediate complications.   MDM   1. Closed olecranon process fracture, left, initial encounter   2. Scalp abscess    Patient with elbow injury from 3 weeks ago. Patient states that she got drunk and fell. hAs olecranon fracture. His been immobilized and will follow with ortho. Also has scalp abscess. Manually drained with pressure. Then scalpel incision was made without return of purulent material. Will give antibiotics. Will need to follow with her PCP  for the abscess  Will I personally performed the services described in this documentation, which was scribed in my presence. The recorded information has been reviewed and is accurate.     Juliet Rude. Rubin Payor, MD 03/11/13 1478

## 2013-03-11 MED ORDER — CEPHALEXIN 500 MG PO CAPS: 500.0000 mg | ORAL_CAPSULE | Freq: Four times a day (QID) | ORAL | Status: DC

## 2013-03-11 MED ORDER — SULFAMETHOXAZOLE-TRIMETHOPRIM 800-160 MG PO TABS: 2.0000 | ORAL_TABLET | Freq: Two times a day (BID) | ORAL | Status: DC

## 2013-03-19 ENCOUNTER — Telehealth: Payer: Self-pay | Admitting: Orthopedic Surgery

## 2013-03-19 NOTE — Telephone Encounter (Signed)
ok 

## 2013-03-19 NOTE — Telephone Encounter (Signed)
Patient called today, 03/19/13, had been seen at [facility] Emergency Room 03/10/13, for problem fracture of left elbow.  Emergency room physician notes indicate that patient had a fall 3 weeks prior to the Emergency Room visit.  (Copy of Xray report indicates :  "*RADIOLOGY REPORT*  Clinical Data: Traumatic injury and pain 3 weeks previous  LEFT ELBOW - COMPLETE 3+ VIEW  Comparison: None.  Findings: There is a mildly displaced fracture through the  olecranon process. No dislocation is noted. A few small fragments  are noted medial to the fracture site. The joint effusion is noted.  IMPRESSION:  Olecranon process fracture   - Patient states has no insurance; reviewed protocol regarding self-pay, and also provided patient with other insurance information to inquire, and she said she will need to call back regarding working out payment arrangements.  *Follow up if no response by Monday 03/22/13.

## 2013-03-25 NOTE — Telephone Encounter (Signed)
Called back to follow up, as no response from patient.  Person at contact ph# [phone] states she is no longer there.  Mentioned "probation."  States no way to contact her.

## 2013-04-20 ENCOUNTER — Encounter (HOSPITAL_COMMUNITY): Payer: Self-pay | Admitting: *Deleted

## 2013-04-20 ENCOUNTER — Emergency Department (HOSPITAL_COMMUNITY)
Admission: EM | Admit: 2013-04-20 | Discharge: 2013-04-20 | Disposition: A | Discharge status: Home / Self Care | Payer: Self-pay | Attending: Emergency Medicine | Admitting: Emergency Medicine

## 2013-04-20 DIAGNOSIS — F3289 Other specified depressive episodes: Secondary | ICD-10-CM | POA: Insufficient documentation

## 2013-04-20 DIAGNOSIS — Z9884 Bariatric surgery status: Secondary | ICD-10-CM | POA: Insufficient documentation

## 2013-04-20 DIAGNOSIS — Z79899 Other long term (current) drug therapy: Secondary | ICD-10-CM | POA: Insufficient documentation

## 2013-04-20 DIAGNOSIS — R109 Unspecified abdominal pain: Secondary | ICD-10-CM | POA: Insufficient documentation

## 2013-04-20 DIAGNOSIS — F17200 Nicotine dependence, unspecified, uncomplicated: Secondary | ICD-10-CM | POA: Insufficient documentation

## 2013-04-20 DIAGNOSIS — F329 Major depressive disorder, single episode, unspecified: Secondary | ICD-10-CM | POA: Insufficient documentation

## 2013-04-20 DIAGNOSIS — G5600 Carpal tunnel syndrome, unspecified upper limb: Secondary | ICD-10-CM | POA: Insufficient documentation

## 2013-04-20 DIAGNOSIS — M25549 Pain in joints of unspecified hand: Secondary | ICD-10-CM | POA: Insufficient documentation

## 2013-04-20 DIAGNOSIS — R209 Unspecified disturbances of skin sensation: Secondary | ICD-10-CM | POA: Insufficient documentation

## 2013-04-20 DIAGNOSIS — Z8719 Personal history of other diseases of the digestive system: Secondary | ICD-10-CM | POA: Insufficient documentation

## 2013-04-20 DIAGNOSIS — G5601 Carpal tunnel syndrome, right upper limb: Secondary | ICD-10-CM

## 2013-04-20 HISTORY — DX: Acute pancreatitis without necrosis or infection, unspecified: K85.90

## 2013-04-20 HISTORY — DX: Depression, unspecified: F32.A

## 2013-04-20 HISTORY — DX: Major depressive disorder, single episode, unspecified: F32.9

## 2013-04-20 MED ORDER — HYDROCODONE-ACETAMINOPHEN 5-325 MG PO TABS
2.0000 | ORAL_TABLET | Freq: Once | ORAL | Status: AC
  Administered 2013-04-20: 2 via ORAL
  Filled 2013-04-20: qty 2

## 2013-04-20 NOTE — ED Provider Notes (Signed)
CSN: [id number]     Arrival date & time 04/20/13  1044 History   First MD Initiated Contact with Patient 04/20/13 1101     Chief Complaint  Patient presents with  . Elbow Pain  . Hand Pain   (Consider location/radiation/quality/duration/timing/severity/associated sxs/prior Treatment) Patient is a 42 y.o. female presenting with hand pain. The history is provided by the patient.  Hand Pain This is a new problem. The current episode started in the past 7 days. The problem occurs daily. The problem has been gradually worsening. Associated symptoms include abdominal pain, arthralgias, joint swelling and numbness. Pertinent negatives include no chest pain, coughing, fever, neck pain or weakness. Exacerbated by: movement and bending the right wrist. She has tried nothing for the symptoms. The treatment provided no relief.    Past Medical History  Diagnosis Date  . Gastric bypass status for obesity   . Pancreatitis   . Depression    Past Surgical History  Procedure Laterality Date  . Gastric bypass     History reviewed. No pertinent family history. History  Substance Use Topics  . Smoking status: Current Every Day Smoker -- 0.50 packs/day    Types: Cigarettes  . Smokeless tobacco: Not on file  . Alcohol Use: No   OB History   Grav Para Term Preterm Abortions TAB SAB Ect Mult Living                 Review of Systems  Constitutional: Negative for fever and activity change.       All ROS Neg except as noted in HPI  HENT: Negative for nosebleeds and neck pain.   Eyes: Negative for photophobia and discharge.  Respiratory: Negative for cough, shortness of breath and wheezing.   Cardiovascular: Negative for chest pain and palpitations.  Gastrointestinal: Positive for abdominal pain. Negative for blood in stool.  Genitourinary: Negative for dysuria, frequency and hematuria.  Musculoskeletal: Positive for joint swelling and arthralgias. Negative for back pain.  Skin: Negative.    Neurological: Positive for numbness. Negative for dizziness, seizures, speech difficulty and weakness.  Psychiatric/Behavioral: Negative for hallucinations and confusion.       Depression    Allergies  Aspirin  Home Medications   Current Outpatient Rx  Name  Route  Sig  Dispense  Refill  . omeprazole (PRILOSEC) 20 MG capsule   Oral   Take 20 mg by mouth daily.          BP 120/52  Pulse 74  Temp(Src) 98.2 F (36.8 C) (Oral)  Resp 14  Ht 5\' 8"  (1.727 m)  Wt 180 lb (81.647 kg)  BMI 27.38 kg/m2  SpO2 100% Physical Exam  Nursing note and vitals reviewed. Constitutional: She is oriented to person, place, and time. She appears well-developed and well-nourished.  Non-toxic appearance.  HENT:  Head: Normocephalic.  Right Ear: Tympanic membrane and external ear normal.  Left Ear: Tympanic membrane and external ear normal.  Eyes: EOM and lids are normal. Pupils are equal, round, and reactive to light.  Neck: Normal range of motion. Neck supple. Carotid bruit is not present.  Cardiovascular: Normal rate, regular rhythm, normal heart sounds, intact distal pulses and normal pulses.   Pulmonary/Chest: Breath sounds normal. No respiratory distress.  Abdominal: Soft. Bowel sounds are normal. There is no tenderness. There is no guarding.  Musculoskeletal: Normal range of motion.  There is full range of motion of the right shoulder. There is soreness of the right elbow. There is pain with flexion and  extension of the right wrist. There is a positive Tinel's sign. Is no atrophy of the thenar eminence. There is full range of motion of the fingers of the right hand.  There is soreness of the left elbow, but no effusion.  Lymphadenopathy:       Head (right side): No submandibular adenopathy present.       Head (left side): No submandibular adenopathy present.    She has no cervical adenopathy.  Neurological: She is alert and oriented to person, place, and time. She has normal strength. No  cranial nerve deficit or sensory deficit.  The patient states she has a numb sensation when the thumb second and third fingers are touched particularly at the palmar surface. She can feel them however. There no motor deficits appreciated.  Skin: Skin is warm and dry.  Psychiatric: She has a normal mood and affect. Her speech is normal.    ED Course  Procedures (including critical care time) Labs Review Labs Reviewed - No data to display Imaging Review No results found.  MDM  No diagnosis found. 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 No gross neurologic deficits appreciated. Patient is fitted with a wrist splint. Patient is referred to Dr. Romeo Apple for orthopedic evaluation. Patient states that she sustained a fracture of the elbow and is still having pain in this area. She has not been seen by orthopedics yet. Patient strongly encouraged to see the orthopedic physician for evaluation of this fracture, and advised to see her primary physician for pain management and control.  Kathie Dike, PA-C 04/20/13 1222

## 2013-04-20 NOTE — ED Provider Notes (Signed)
Medical screening examination/treatment/procedure(s) were performed by non-physician practitioner and as supervising physician I was immediately available for consultation/collaboration.   Charleene Callegari T Nikita Humble, MD 04/20/13 1550 

## 2013-04-20 NOTE — ED Notes (Signed)
Pain in R elbow radiating down ventral surface to 2-4th fingers.  Pain is numbness, tingling, "prickly" sensation.  Keeps her awake at night.  Beginning last night started to have same symptoms in L hand. Works in the Clinical research associate at [employer], cutting meats, sweeping and mopping..  Does lot of crosstich for recreation.  Has also noted some swelling in bilateral ankles that is new.  1+ pitting edema noted in ankles.

## 2013-05-02 ENCOUNTER — Emergency Department (HOSPITAL_COMMUNITY)
Admission: EM | Admit: 2013-05-02 | Discharge: 2013-05-02 | Disposition: A | Discharge status: Home / Self Care | Payer: Self-pay | Attending: Emergency Medicine | Admitting: Emergency Medicine

## 2013-05-02 ENCOUNTER — Emergency Department (HOSPITAL_COMMUNITY): Payer: Self-pay

## 2013-05-02 ENCOUNTER — Encounter (HOSPITAL_COMMUNITY): Payer: Self-pay | Admitting: Emergency Medicine

## 2013-05-02 DIAGNOSIS — Z79899 Other long term (current) drug therapy: Secondary | ICD-10-CM | POA: Insufficient documentation

## 2013-05-02 DIAGNOSIS — Z8659 Personal history of other mental and behavioral disorders: Secondary | ICD-10-CM | POA: Insufficient documentation

## 2013-05-02 DIAGNOSIS — IMO0002 Reserved for concepts with insufficient information to code with codable children: Secondary | ICD-10-CM | POA: Insufficient documentation

## 2013-05-02 DIAGNOSIS — S42402K Unspecified fracture of lower end of left humerus, subsequent encounter for fracture with nonunion: Secondary | ICD-10-CM

## 2013-05-02 DIAGNOSIS — Z862 Personal history of diseases of the blood and blood-forming organs and certain disorders involving the immune mechanism: Secondary | ICD-10-CM | POA: Insufficient documentation

## 2013-05-02 DIAGNOSIS — G8929 Other chronic pain: Secondary | ICD-10-CM | POA: Insufficient documentation

## 2013-05-02 DIAGNOSIS — Z8639 Personal history of other endocrine, nutritional and metabolic disease: Secondary | ICD-10-CM | POA: Insufficient documentation

## 2013-05-02 DIAGNOSIS — Z8719 Personal history of other diseases of the digestive system: Secondary | ICD-10-CM | POA: Insufficient documentation

## 2013-05-02 DIAGNOSIS — G8911 Acute pain due to trauma: Secondary | ICD-10-CM | POA: Insufficient documentation

## 2013-05-02 DIAGNOSIS — F17200 Nicotine dependence, unspecified, uncomplicated: Secondary | ICD-10-CM | POA: Insufficient documentation

## 2013-05-02 MED ORDER — HYDROCODONE-ACETAMINOPHEN 5-325 MG PO TABS
1.0000 | ORAL_TABLET | Freq: Once | ORAL | Status: AC
  Administered 2013-05-02: 1 via ORAL
  Filled 2013-05-02: qty 1

## 2013-05-02 MED ORDER — HYDROCODONE-ACETAMINOPHEN 5-325 MG PO TABS: 1.0000 | ORAL_TABLET | ORAL | Status: DC | PRN

## 2013-05-02 NOTE — ED Notes (Signed)
Pt reports fractured left elbow in Aug and has not been able to see an orthopedist.  Pt has been wearing temporary splint that was put on in ED.  Pt c/o pain to left elbow.

## 2013-05-02 NOTE — ED Provider Notes (Signed)
  Medical screening examination/treatment/procedure(s) were performed by non-physician practitioner and as supervising physician I was immediately available for consultation/collaboration.    Gerhard Munch, MD 05/02/13 2111

## 2013-05-02 NOTE — ED Provider Notes (Signed)
CSN: [id number]     Arrival date & time 05/02/13  1632 History   First MD Initiated Contact with Patient 05/02/13 1700     Chief Complaint  Patient presents with  . Arm Pain   (Consider location/radiation/quality/duration/timing/severity/associated sxs/prior Treatment) HPI Comments: Jennifer [name] is a 42 y.o. Female presenting with complaint of chronic left elbow pain.  She sustained a fracture to her left olecranon 2 months ago,  Was seen here for this injury and was placed in a splint and referred to Dr Romeo Apple for definitive treatment of this injury.  Unfortunately she did not followup due to lack of insurance.  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  She denies weakness or numbness distal to the injury site.  She takes tylenol without relief.       The history is provided by the patient.    Past Medical History  Diagnosis Date  . Gastric bypass status for obesity   . Pancreatitis   . Depression    Past Surgical History  Procedure Laterality Date  . Gastric bypass     No family history on file. History  Substance Use Topics  . Smoking status: Current Every Day Smoker -- 0.50 packs/day    Types: Cigarettes  . Smokeless tobacco: Not on file  . Alcohol Use: No   OB History   Grav Para Term Preterm Abortions TAB SAB Ect Mult Living                 Review of Systems  Constitutional: Negative for fever.  Musculoskeletal: Positive for arthralgias and joint swelling. Negative for myalgias.  Neurological: Negative for weakness and numbness.    Allergies  Aspirin  Home Medications   Current Outpatient Rx  Name  Route  Sig  Dispense  Refill  . Multiple Vitamin (MULTIVITAMIN WITH MINERALS) TABS tablet   Oral   Take 1 tablet by mouth daily.         Marland Kitchen omeprazole (PRILOSEC) 20 MG capsule   Oral   Take 20 mg  by mouth every morning.          Marland Kitchen HYDROcodone-acetaminophen (NORCO/VICODIN) 5-325 MG per tablet   Oral   Take 1 tablet by mouth every 4 (four) hours as needed for pain.   20 tablet   0    BP 151/72  Pulse 94  Temp(Src) 98.5 F (36.9 C) (Oral)  Resp 18  Ht 5\' 8"  (1.727 m)  Wt 170 lb (77.111 kg)  BMI 25.85 kg/m2  SpO2 98% Physical Exam  Constitutional: She appears well-developed and well-nourished.  HENT:  Head: Atraumatic.  Neck: Normal range of motion.  Cardiovascular:  Pulses equal bilaterally  Musculoskeletal: She exhibits edema and tenderness.  Palpable deformity left elbow at olecranon.  Less than 3 sec cap refill in fingers,  Radial pulse full.  Equal grip strength.    Neurological: She is alert. She has normal strength. She displays normal reflexes. No sensory deficit.  Equal strength  Skin: Skin is warm and dry.  Psychiatric: She has a normal mood and affect.    ED Course  Procedures (including critical care time) Labs Review Labs Reviewed - No data to display Imaging Review Dg Elbow Complete Left  05/02/2013   CLINICAL DATA:  Arm pain, swelling.  EXAM: LEFT  ELBOW - COMPLETE 3+ VIEW  COMPARISON:  03/10/2013  FINDINGS: Again noted is the olecranon process fracture. The fracture fragment is displaced approximately 10 mm. Joint effusion present. No real change since prior study.  IMPRESSION: Displaced olecranon process fracture and joint effusion, unchanged.   Electronically Signed   By: Charlett Nose M.D.   On: 05/02/2013 18:04    EKG Interpretation   None       MDM   1. Elbow fracture, left, with nonunion, subsequent encounter    Patients labs and/or radiological studies were viewed and considered during the medical decision making and disposition process. No improvement in injury, discussed with pt that her injury will not heal without orthopedic intervention. She and sig other at bedside understand.   She was referred back to dr Romeo Apple,  Also given  resource information for assistance with medical expenses.  Placed in new splint and sling.  Prescribed hydrocodone.    Burgess Amor, PA-C 05/02/13 2029

## 2013-05-05 ENCOUNTER — Encounter: Payer: Self-pay | Admitting: Orthopedic Surgery

## 2013-05-05 ENCOUNTER — Ambulatory Visit (INDEPENDENT_AMBULATORY_CARE_PROVIDER_SITE_OTHER): Payer: Self-pay | Admitting: Orthopedic Surgery

## 2013-05-05 VITALS — BP 113/73 | Ht 68.0 in | Wt 171.0 lb

## 2013-05-05 DIAGNOSIS — S52023A Displaced fracture of olecranon process without intraarticular extension of unspecified ulna, initial encounter for closed fracture: Secondary | ICD-10-CM | POA: Insufficient documentation

## 2013-05-05 DIAGNOSIS — S52022A Displaced fracture of olecranon process without intraarticular extension of left ulna, initial encounter for closed fracture: Secondary | ICD-10-CM

## 2013-05-05 NOTE — Patient Instructions (Signed)
Call office when paper work and financial aid completed

## 2013-05-05 NOTE — Progress Notes (Signed)
  Subjective:    Patient ID: Jennifer [name], female    DOB: Jul 05, 1971, 42 y.o.   MRN: [id number]  Chief Complaint  Patient presents with  . Elbow Pain    Fractured left elbow d/t injury 02/16/13 (went to ER 03/10/13 and 05/02/13)    HPI41 yo female RHD, fractured left elbow 2nd to fall in August, was [name] in ER but was uninsured and did not come to office; she went back to Er this week , x-rays were repeated and she has a persistent fracture of the left olecranon with displacement. He complains of pain 9/10 constant associated with decreased range of motion and weakness     Review of Systems Fever chills fatigue watering of the eyes wheezing heartburn nausea joint pain swelling instability warmth dizziness nervousness anxiety depression other systems are negative  Allergies aspirin  Acid reflux pancreatitis  Gastric bypass  [facility] is the primary care  Medication multivitamin omeprazole trazodone Paxil family history negative social history divorced  Three quarter pack history of smoking alcohol occasional caffeine is is gray completed bachelors degree    Objective:   Physical Exam BP 113/73  Ht 5\' 8"  (1.727 m)  Wt 171 lb (77.565 kg)  BMI 26.01 kg/m2 General appearance is normal, the patient is alert and oriented x3 with normal mood and affect. Her peripheral vascular system by observation was normal palpation was normal  She had no lymphadenopathy  Her gait was normal sensation was normal  Her lower extremities have normal alignment. Range of motion assessment no contractures assessment of stability noticed point subluxation or laxity  Muscle tone strength assessment no spasticity atrophy or tremor  Skin normal  No pathologic reflexes normal coordination  Right upper extremity normal range of motion strength stability strength skin and alignment  Left elbow Noted at the olecranon she does have extension to 30 with weakness she has extension  against gravity but not against resistance her elbow is stable the skin is intact she has some tenderness at the fracture site otherwise neurovascular exam is intact         Assessment & Plan:  A series of 2 sets of x-rays show an olecranon fracture with displacement  Impression same left olecranon fracture  Recommend open treatment internal fixation. We are going to wait until the paperwork has been completed since the fracture is now old. She's advised of her options which include surgical treatment with expectation of loss of motion.

## 2013-05-06 ENCOUNTER — Ambulatory Visit: Payer: Self-pay | Admitting: Orthopedic Surgery

## 2014-01-22 ENCOUNTER — Encounter (HOSPITAL_COMMUNITY): Payer: Self-pay | Admitting: Emergency Medicine

## 2014-01-22 ENCOUNTER — Inpatient Hospital Stay (HOSPITAL_COMMUNITY)
Admission: EM | Admit: 2014-01-22 | Discharge: 2014-01-25 | DRG: 439 | Disposition: A | Discharge status: Home / Self Care | Payer: Self-pay | Attending: Family Medicine | Admitting: Family Medicine

## 2014-01-22 DIAGNOSIS — K852 Alcohol induced acute pancreatitis without necrosis or infection: Secondary | ICD-10-CM

## 2014-01-22 DIAGNOSIS — K219 Gastro-esophageal reflux disease without esophagitis: Secondary | ICD-10-CM | POA: Diagnosis present

## 2014-01-22 DIAGNOSIS — F10939 Alcohol use, unspecified with withdrawal, unspecified: Secondary | ICD-10-CM | POA: Diagnosis present

## 2014-01-22 DIAGNOSIS — F10239 Alcohol dependence with withdrawal, unspecified: Secondary | ICD-10-CM | POA: Diagnosis present

## 2014-01-22 DIAGNOSIS — Z9884 Bariatric surgery status: Secondary | ICD-10-CM

## 2014-01-22 DIAGNOSIS — F1010 Alcohol abuse, uncomplicated: Secondary | ICD-10-CM

## 2014-01-22 DIAGNOSIS — E860 Dehydration: Secondary | ICD-10-CM

## 2014-01-22 DIAGNOSIS — F1020 Alcohol dependence, uncomplicated: Secondary | ICD-10-CM | POA: Diagnosis present

## 2014-01-22 DIAGNOSIS — E871 Hypo-osmolality and hyponatremia: Secondary | ICD-10-CM | POA: Diagnosis present

## 2014-01-22 DIAGNOSIS — K859 Acute pancreatitis without necrosis or infection, unspecified: Principal | ICD-10-CM | POA: Diagnosis present

## 2014-01-22 DIAGNOSIS — F17200 Nicotine dependence, unspecified, uncomplicated: Secondary | ICD-10-CM | POA: Diagnosis present

## 2014-01-22 DIAGNOSIS — F411 Generalized anxiety disorder: Secondary | ICD-10-CM | POA: Diagnosis present

## 2014-01-22 MED ORDER — ONDANSETRON HCL 4 MG/2ML IJ SOLN
4.0000 mg | Freq: Once | INTRAMUSCULAR | Status: AC
  Administered 2014-01-23: 4 mg via INTRAVENOUS
  Filled 2014-01-22: qty 2

## 2014-01-22 MED ORDER — SODIUM CHLORIDE 0.9 % IV BOLUS (SEPSIS)
1000.0000 mL | Freq: Once | INTRAVENOUS | Status: AC
  Administered 2014-01-23: 1000 mL via INTRAVENOUS

## 2014-01-22 NOTE — ED Notes (Signed)
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  Pt states she has had pancreatitis before.

## 2014-01-22 NOTE — ED Provider Notes (Signed)
CSN: [id number]     Arrival date & time 01/22/14  2254 History  This chart was scribed for Vida Roller, MD by Leona Carry, ED Scribe. The patient was seen in [facility]. The patient's care was started at 11:56 PM.   Chief Complaint  Patient presents with  . Abdominal Pain   HPI HPI Comments: Jennifer [name] is a 43 y.o. female with a history of alcoholism and pancreatitis who presents to the Emergency Department complaining of gradually worsening epigastric abdominal pain with radiation to lower quadrants. She reports associated nausea, vomiting and mild left-sided facial swelling with mild dental pain. Patient reports that she has been "drinking for a week straight" (more heavily than normal) and hasn't eaten very much food. Patient reports that this binge drinking is associated with the recent loss of her brother-in-law and legal troubles of her daughter being incarcerated  Patient endorses daily heavy alcohol use. She reports that her last drink was this afternoon. Patient had gastric bypass surgery eight years ago. She has lost approximately 160 pounds.  ROS otherwise negative.  Past Medical History  Diagnosis Date  . Gastric bypass status for obesity   . Pancreatitis   . Depression    Past Surgical History  Procedure Laterality Date  . Gastric bypass     No family history on file. History  Substance Use Topics  . Smoking status: Current Every Day Smoker -- 0.50 packs/day    Types: Cigarettes  . Smokeless tobacco: Not on file  . Alcohol Use: Yes   OB History   Grav Para Term Preterm Abortions TAB SAB Ect Mult Living                 Review of Systems  All other systems reviewed and are negative.     Allergies  Aspirin  Home Medications   Prior to Admission medications   Medication Sig Start Date End Date Taking? Authorizing Provider  Multiple Vitamin (MULTIVITAMIN WITH MINERALS) TABS tablet Take 1 tablet by mouth daily.   Yes Historical Provider, MD   omeprazole (PRILOSEC) 20 MG capsule Take 20 mg by mouth every morning.    Yes Historical Provider, MD  HYDROcodone-acetaminophen (NORCO/VICODIN) 5-325 MG per tablet Take 1 tablet by mouth every 4 (four) hours as needed for pain. 05/02/13   Burgess Amor, PA-C   Triage Vitals: BP 141/71  Pulse 67  Temp(Src) 97.8 F (36.6 C) (Oral)  Resp 18  Ht 5\' 8"  (1.727 m)  Wt 170 lb (77.111 kg)  BMI 25.85 kg/m2  SpO2 98% Physical Exam  Nursing note and vitals reviewed. Constitutional: She is oriented to person, place, and time. She appears well-developed and well-nourished.  Ill--appearing.   HENT:  Head: Normocephalic and atraumatic.  Mild swelling of left cheek. Oral exam without gingival abscess.   Eyes: Conjunctivae and EOM are normal.  Neck: Neck supple. No tracheal deviation present.  Cardiovascular: Normal rate.   Pulmonary/Chest: Effort normal. No respiratory distress.  Abdominal: Soft. There is tenderness (Epigastric). There is guarding.  No peritoneal signs.  Musculoskeletal: Normal range of motion.  Neurological: She is alert and oriented to person, place, and time.  No tremor, seizures. Normal mental status.  Skin: Skin is warm and dry.  Psychiatric: She has a normal mood and affect. Her behavior is normal.    ED Course  Procedures (including critical care time) DIAGNOSTIC STUDIES: Oxygen Saturation is 98% on room air, normal by my interpretation.    COORDINATION OF CARE: 12:03  Sherian ReinNewman Pies ADTEXTTAG>AG>[place] hildrMarland Kitchenen - CincinnatThe En Hospital - SouthfFay vioral Healt<[phone]>ter L XTTAG>TEXTTAG>DTEXTTAG>lvary HospitalAxel Filler Lupita [phone] LLCO[facility] New OrleansMarland Kitchen Arley PhenixMid-Jefferson [facility] Ines BloomerQuentin Angst [place] Fay RecordsCorry Memorial HospitalGerome Sam04.5Joellyn QuailsShell RockEverlene Other52.8Renford DillsWarden/ranger34mArley PhenixPenn Medical Princeton Medical[address] Ines BloomerQuentin Angst [place] Fay RecordsSurgery Center Of CaliforniaGerome Sam04.5Joellyn QuailsSt. CloudEverlene Other52.8Renford DillsWarden/ranger6mSherian ReinNewman Pies  of acute pancreatitis - there is some soft signs of possible necrosis of pancreatic tissue but no abscess, no perforation.  She has been given dilaudid with some improvement - tolerated oral contrast and has received IVF.  She has ongoing pain and given her significant dehdyration (80+ ketones) - her use of heavy alcohol she is unsafe to go home at risk of worsening condition.  The pt is agreeable to admission.  Will d/w Dr. Sharl MaLama the hospitalist for admission.  Meds given in ED:  Medications  LORazepam (ATIVAN) tablet 0-4 mg (not administered)  LORazepam (ATIVAN) tablet 0-4 mg (not administered)  LORazepam (ATIVAN) injection 0-4 mg (1 mg Intravenous Given 01/23/14 0042)  LORazepam (ATIVAN) injection 0-4 mg (not administered)  thiamine (VITAMIN B-1) tablet 100 mg (not administered)  thiamine (B-1) injection 100 mg (not administered)  sodium chloride 0.9 % bolus 1,000 mL (not administered)  sodium chloride 0.9 % bolus 1,000 mL (1,000 mLs Intravenous New  Bag/Given 01/23/14 0032)  ondansetron (ZOFRAN) injection 4 mg (4 mg Intravenous Given 01/23/14 0031)  iohexol (OMNIPAQUE) 300 MG/ML solution 25 mL (25 mLs Oral Contrast Given 01/23/14 0016)  iohexol (OMNIPAQUE) 300 MG/ML solution 100 mL (100 mLs Intravenous Contrast Given 01/23/14 0136)  HYDROmorphone (DILAUDID) injection 1 mg (1 mg Intravenous Given 01/23/14 0216)  ondansetron (ZOFRAN) injection 4 mg (4 mg Intravenous Given 01/23/14 0216)    New Prescriptions   No medications on file      I personally performed the services described in this documentation, which was scribed in my presence. The recorded information has been reviewed and is accurate.      Vida RollerBrian D Deklyn Trachtenberg, MD 01/23/14 [phone]

## 2014-01-23 ENCOUNTER — Emergency Department (HOSPITAL_COMMUNITY): Payer: Self-pay

## 2014-01-23 ENCOUNTER — Encounter (HOSPITAL_COMMUNITY): Payer: Self-pay | Admitting: *Deleted

## 2014-01-23 DIAGNOSIS — F1010 Alcohol abuse, uncomplicated: Secondary | ICD-10-CM

## 2014-01-23 DIAGNOSIS — K859 Acute pancreatitis without necrosis or infection, unspecified: Principal | ICD-10-CM

## 2014-01-23 LAB — COMPREHENSIVE METABOLIC PANEL
ALK PHOS: 94 U/L (ref 39–117)
ALT: 15 U/L (ref 0–35)
ALT: 18 U/L (ref 0–35)
ANION GAP: 16 — AB (ref 5–15)
ANION GAP: 20 — AB (ref 5–15)
AST: 42 U/L — ABNORMAL HIGH (ref 0–37)
AST: 33 U/L (ref 0–37)
Albumin: 3.9 g/dL (ref 3.5–5.2)
Albumin: 3.3 g/dL — ABNORMAL LOW (ref 3.5–5.2)
Alkaline Phosphatase: 108 U/L (ref 39–117)
BUN: 5 mg/dL — AB (ref 6–23)
BUN: 8 mg/dL (ref 6–23)
CALCIUM: 9.5 mg/dL (ref 8.4–10.5)
CHLORIDE: 90 meq/L — AB (ref 96–112)
CO2: 25 meq/L (ref 19–32)
CO2: 26 meq/L (ref 19–32)
CREATININE: 0.57 mg/dL (ref 0.50–1.10)
Calcium: 8.7 mg/dL (ref 8.4–10.5)
Chloride: 86 mEq/L — ABNORMAL LOW (ref 96–112)
Creatinine, Ser: 0.49 mg/dL — ABNORMAL LOW (ref 0.50–1.10)
GLUCOSE: 104 mg/dL — AB (ref 70–99)
GLUCOSE: 138 mg/dL — AB (ref 70–99)
POTASSIUM: 4.2 meq/L (ref 3.7–5.3)
Potassium: 4.4 mEq/L (ref 3.7–5.3)
SODIUM: 131 meq/L — AB (ref 137–147)
Sodium: 132 mEq/L — ABNORMAL LOW (ref 137–147)
TOTAL PROTEIN: 8.5 g/dL — AB (ref 6.0–8.3)
Total Bilirubin: 1.5 mg/dL — ABNORMAL HIGH (ref 0.3–1.2)
Total Bilirubin: 1 mg/dL (ref 0.3–1.2)
Total Protein: 7.1 g/dL (ref 6.0–8.3)

## 2014-01-23 LAB — CBC WITH DIFFERENTIAL/PLATELET
BASOS PCT: 0 % (ref 0–1)
Basophils Absolute: 0 10*3/uL (ref 0.0–0.1)
EOS ABS: 0 10*3/uL (ref 0.0–0.7)
EOS PCT: 0 % (ref 0–5)
HEMATOCRIT: 34.5 % — AB (ref 36.0–46.0)
HEMOGLOBIN: 11.5 g/dL — AB (ref 12.0–15.0)
LYMPHS ABS: 0.5 10*3/uL — AB (ref 0.7–4.0)
Lymphocytes Relative: 4 % — ABNORMAL LOW (ref 12–46)
MCH: 26.4 pg (ref 26.0–34.0)
MCHC: 33.3 g/dL (ref 30.0–36.0)
MCV: 79.1 fL (ref 78.0–100.0)
MONO ABS: 0.9 10*3/uL (ref 0.1–1.0)
Monocytes Relative: 7 % (ref 3–12)
NEUTROS ABS: 11.5 10*3/uL — AB (ref 1.7–7.7)
NEUTROS PCT: 89 % — AB (ref 43–77)
Platelets: 436 10*3/uL — ABNORMAL HIGH (ref 150–400)
RBC: 4.36 MIL/uL (ref 3.87–5.11)
RDW: 22.4 % — ABNORMAL HIGH (ref 11.5–15.5)
WBC: 12.9 10*3/uL — ABNORMAL HIGH (ref 4.0–10.5)

## 2014-01-23 LAB — URINALYSIS, ROUTINE W REFLEX MICROSCOPIC
GLUCOSE, UA: NEGATIVE mg/dL
Hgb urine dipstick: NEGATIVE
LEUKOCYTES UA: NEGATIVE
Nitrite: NEGATIVE
PROTEIN: 30 mg/dL — AB
Specific Gravity, Urine: 1.02 (ref 1.005–1.030)
UROBILINOGEN UA: 4 mg/dL — AB (ref 0.0–1.0)
pH: 7 (ref 5.0–8.0)

## 2014-01-23 LAB — CBC
HEMATOCRIT: 30.8 % — AB (ref 36.0–46.0)
HEMOGLOBIN: 10 g/dL — AB (ref 12.0–15.0)
MCH: 25.8 pg — ABNORMAL LOW (ref 26.0–34.0)
MCHC: 32.5 g/dL (ref 30.0–36.0)
MCV: 79.4 fL (ref 78.0–100.0)
Platelets: 420 10*3/uL — ABNORMAL HIGH (ref 150–400)
RBC: 3.88 MIL/uL (ref 3.87–5.11)
RDW: 22.4 % — ABNORMAL HIGH (ref 11.5–15.5)
WBC: 11.7 10*3/uL — AB (ref 4.0–10.5)

## 2014-01-23 LAB — URINE MICROSCOPIC-ADD ON

## 2014-01-23 LAB — LIPASE, BLOOD
Lipase: 1313 U/L — ABNORMAL HIGH (ref 11–59)
Lipase: 953 U/L — ABNORMAL HIGH (ref 11–59)

## 2014-01-23 LAB — PREGNANCY, URINE: PREG TEST UR: NEGATIVE

## 2014-01-23 LAB — LACTIC ACID, PLASMA: LACTIC ACID, VENOUS: 1.9 mmol/L (ref 0.5–2.2)

## 2014-01-23 LAB — ETHANOL: Alcohol, Ethyl (B): <11 mg/dL (ref 0–11)

## 2014-01-23 MED ORDER — LORAZEPAM 2 MG/ML IJ SOLN: 0.0000 mg | Freq: Two times a day (BID) | INTRAMUSCULAR | Status: DC

## 2014-01-23 MED ORDER — LORAZEPAM 2 MG/ML IJ SOLN
0.0000 mg | Freq: Four times a day (QID) | INTRAMUSCULAR | Status: DC
  Administered 2014-01-23: 1 mg via INTRAVENOUS

## 2014-01-23 MED ORDER — ONDANSETRON HCL 4 MG/2ML IJ SOLN
4.0000 mg | Freq: Once | INTRAMUSCULAR | Status: AC
  Administered 2014-01-23: 4 mg via INTRAVENOUS
  Filled 2014-01-23: qty 2

## 2014-01-23 MED ORDER — LORAZEPAM 1 MG PO TABS: 0.0000 mg | ORAL_TABLET | Freq: Two times a day (BID) | ORAL | Status: DC

## 2014-01-23 MED ORDER — LORAZEPAM 1 MG PO TABS: 0.0000 mg | ORAL_TABLET | Freq: Four times a day (QID) | ORAL | Status: DC

## 2014-01-23 MED ORDER — IOHEXOL 300 MG/ML  SOLN
100.0000 mL | Freq: Once | INTRAMUSCULAR | Status: AC | PRN
  Administered 2014-01-23: 100 mL via INTRAVENOUS

## 2014-01-23 MED ORDER — ENOXAPARIN SODIUM 40 MG/0.4ML ~~LOC~~ SOLN: 40.0000 mg | SUBCUTANEOUS | Status: DC

## 2014-01-23 MED ORDER — THIAMINE HCL 100 MG/ML IJ SOLN: 100.0000 mg | Freq: Every day | INTRAMUSCULAR | Status: DC

## 2014-01-23 MED ORDER — SODIUM CHLORIDE 0.9 % IV SOLN
500.0000 mg | Freq: Three times a day (TID) | INTRAVENOUS | Status: DC
  Administered 2014-01-23: 500 mg via INTRAVENOUS
  Filled 2014-01-23 (×5): qty 0.5

## 2014-01-23 MED ORDER — SODIUM CHLORIDE 0.9 % IV SOLN
1.0000 g | Freq: Three times a day (TID) | INTRAVENOUS | Status: DC
  Administered 2014-01-23 – 2014-01-25 (×6): 1 g via INTRAVENOUS
  Filled 2014-01-23 (×7): qty 1

## 2014-01-23 MED ORDER — LORAZEPAM 2 MG/ML IJ SOLN
0.0000 mg | Freq: Two times a day (BID) | INTRAMUSCULAR | Status: DC
  Filled 2014-01-23: qty 1

## 2014-01-23 MED ORDER — ENOXAPARIN SODIUM 40 MG/0.4ML ~~LOC~~ SOLN
40.0000 mg | SUBCUTANEOUS | Status: DC
  Administered 2014-01-23 – 2014-01-25 (×3): 40 mg via SUBCUTANEOUS
  Filled 2014-01-23 (×3): qty 0.4

## 2014-01-23 MED ORDER — MEROPENEM 1 G IV SOLR
INTRAVENOUS | Status: AC
  Filled 2014-01-23: qty 1

## 2014-01-23 MED ORDER — LORAZEPAM 1 MG PO TABS
0.0000 mg | ORAL_TABLET | Freq: Two times a day (BID) | ORAL | Status: DC
  Filled 2014-01-23 (×2): qty 1

## 2014-01-23 MED ORDER — HYDROMORPHONE HCL PF 1 MG/ML IJ SOLN
0.5000 mg | INTRAMUSCULAR | Status: DC | PRN
  Administered 2014-01-23 – 2014-01-25 (×11): 0.5 mg via INTRAVENOUS
  Filled 2014-01-23 (×11): qty 1

## 2014-01-23 MED ORDER — LORAZEPAM 2 MG/ML IJ SOLN
1.0000 mg | Freq: Four times a day (QID) | INTRAMUSCULAR | Status: DC | PRN
  Administered 2014-01-23 – 2014-01-24 (×3): 1 mg via INTRAVENOUS
  Filled 2014-01-23 (×3): qty 1

## 2014-01-23 MED ORDER — ACETAMINOPHEN 325 MG PO TABS: 650.0000 mg | ORAL_TABLET | Freq: Four times a day (QID) | ORAL | Status: DC | PRN

## 2014-01-23 MED ORDER — SODIUM CHLORIDE 0.9 % IV BOLUS (SEPSIS)
1000.0000 mL | Freq: Once | INTRAVENOUS | Status: DC
  Administered 2014-01-23: 1000 mL via INTRAVENOUS

## 2014-01-23 MED ORDER — ONDANSETRON HCL 4 MG/2ML IJ SOLN: 4.0000 mg | Freq: Three times a day (TID) | INTRAMUSCULAR | Status: AC | PRN

## 2014-01-23 MED ORDER — HYDROMORPHONE HCL PF 1 MG/ML IJ SOLN
1.0000 mg | Freq: Once | INTRAMUSCULAR | Status: AC
  Administered 2014-01-23: 1 mg via INTRAVENOUS
  Filled 2014-01-23: qty 1

## 2014-01-23 MED ORDER — IOHEXOL 300 MG/ML  SOLN
25.0000 mL | Freq: Once | INTRAMUSCULAR | Status: AC | PRN
  Administered 2014-01-23: 25 mL via ORAL

## 2014-01-23 MED ORDER — ONDANSETRON HCL 4 MG/2ML IJ SOLN
4.0000 mg | Freq: Four times a day (QID) | INTRAMUSCULAR | Status: DC | PRN
  Administered 2014-01-23 (×2): 4 mg via INTRAVENOUS
  Filled 2014-01-23 (×2): qty 2

## 2014-01-23 MED ORDER — HYDROMORPHONE HCL PF 1 MG/ML IJ SOLN
0.5000 mg | INTRAMUSCULAR | Status: AC | PRN
  Administered 2014-01-23: 0.5 mg via INTRAVENOUS
  Filled 2014-01-23: qty 1

## 2014-01-23 MED ORDER — ACETAMINOPHEN 650 MG RE SUPP
650.0000 mg | Freq: Four times a day (QID) | RECTAL | Status: DC | PRN
Start: 2014-01-23 — End: 2014-01-23

## 2014-01-23 MED ORDER — VITAMIN B-1 100 MG PO TABS
100.0000 mg | ORAL_TABLET | Freq: Every day | ORAL | Status: DC
  Administered 2014-01-23 – 2014-01-25 (×3): 100 mg via ORAL
  Filled 2014-01-23 (×3): qty 1

## 2014-01-23 MED ORDER — VITAMIN B-1 100 MG PO TABS: 100.0000 mg | ORAL_TABLET | Freq: Every day | ORAL | Status: DC

## 2014-01-23 MED ORDER — SODIUM CHLORIDE 0.9 % IV SOLN
INTRAVENOUS | Status: AC
  Administered 2014-01-23: 13:00:00 via INTRAVENOUS

## 2014-01-23 MED ORDER — ADULT MULTIVITAMIN W/MINERALS CH
1.0000 | ORAL_TABLET | Freq: Every day | ORAL | Status: DC
  Administered 2014-01-23 – 2014-01-25 (×3): 1 via ORAL
  Filled 2014-01-23 (×3): qty 1

## 2014-01-23 MED ORDER — LORAZEPAM 1 MG PO TABS
1.0000 mg | ORAL_TABLET | Freq: Four times a day (QID) | ORAL | Status: DC | PRN
  Administered 2014-01-24 (×2): 1 mg via ORAL

## 2014-01-23 NOTE — Progress Notes (Signed)
Utilization review Completed Kelijah Towry RN BSN   

## 2014-01-23 NOTE — H&P (Signed)
[facility]   Chief Complaint:  Abdominal pain  HPI: 43 year old female who   has a past medical history of Gastric bypass status for obesity; Pancreatitis; and Depression. Today presents to the ED with chief complaint of abdominal pain with nausea and vomiting which started yesterday. Patient says that around one time she started having epigastric pain which was 10/10 in intensity and was associated with nausea and vomiting. Patient has a history of gastric bypass 8 years ago, she was only able to vomit mucous. She denies any blood in it. No diarrhea. Patient has a history of alcohol abuse, drinks 1 pint of vodka everyday and she has been drinking more heavily than normal. Binge drinking has been associated with recent loss of brother-in-law and legal troubles of her daughters being incarcerated. She denies chest pain, no shortness of breath, no fever no dysuria, she admits to having lightheadedness but denies passing out. Denies headache. In the ED patient was found to have elevated lipase 1313, total bili 1.5.  Allergies:   Allergies  Allergen Reactions  . Aspirin       Past Medical History  Diagnosis Date  . Gastric bypass status for obesity   . Pancreatitis   . Depression     Past Surgical History  Procedure Laterality Date  . Gastric bypass      Prior to Admission medications   Medication Sig Start Date End Date Taking? Authorizing Provider  Multiple Vitamin (MULTIVITAMIN WITH MINERALS) TABS tablet Take 1 tablet by mouth daily.   Yes Historical Provider, MD  omeprazole (PRILOSEC) 20 MG capsule Take 20 mg by mouth every morning.    Yes Historical Provider, MD  HYDROcodone-acetaminophen (NORCO/VICODIN) 5-325 MG per tablet Take 1 tablet by mouth every 4 (four) hours as needed for pain. 05/02/13   Burgess AmorJulie Idol, PA-C    Social History:  reports that she has been smoking Cigarettes.  She has been smoking about 0.50 packs per day. She does not have  any smokeless tobacco history on file. She reports that she drinks alcohol. She reports that she does not use illicit drugs.    All the positives are listed in BOLD  Review of Systems:  HEENT: Headache, blurred vision, runny nose, sore throat Neck: Hypothyroidism, hyperthyroidism,,lymphadenopathy Chest : Shortness of breath, history of COPD, Asthma Heart : Chest pain, history of coronary arterey disease GI:  Nausea, vomiting, diarrhea, constipation, GERD GU: Dysuria, urgency, frequency of urination, hematuria Neuro: Stroke, seizures, syncope Psych: Depression, anxiety, hallucinations   Physical Exam: Blood pressure 145/86, pulse 76, temperature 97.8 F (36.6 C), temperature source Oral, resp. rate 18, height 5\' 8"  (1.727 m), weight 77.111 kg (170 lb), SpO2 100.00%. Constitutional:   Patient is a well-developed and well-nourished female* in no acute distress and cooperative with exam. Head: Normocephalic and atraumatic Mouth: Mucus membranes moist Eyes: PERRL, EOMI, conjunctivae normal Neck: Supple, No Thyromegaly Cardiovascular: RRR, S1 normal, S2 normal Pulmonary/Chest: CTAB, no wheezes, rales, or rhonchi Abdominal: Soft. Epigastric tenderness on palpation, non-distended, bowel sounds are normal, no masses, organomegaly, or guarding present.  Neurological: A&O x3, Strenght is normal and symmetric bilaterally, cranial nerve II-XII are grossly intact, no focal motor deficit, sensory intact to light touch bilaterally.  Extremities : No Cyanosis, Clubbing or Edema  Labs on Admission:  Basic Metabolic Panel:  Recent Labs Lab 01/22/14 2356  NA 131*  K 4.4  CL 86*  CO2 25  GLUCOSE 138*  BUN 8  CREATININE 0.57  CALCIUM 9.5   Liver Function Tests:  Recent Labs Lab 01/22/14 2356  AST 42*  ALT 18  ALKPHOS 108  BILITOT 1.5*  PROT 8.5*  ALBUMIN 3.9    Recent Labs Lab 01/22/14 2356  LIPASE 1313*   No results found for this basename: AMMONIA,  in the last 168  hours CBC:  Recent Labs Lab 01/22/14 2356  WBC 12.9*  NEUTROABS 11.5*  HGB 11.5*  HCT 34.5*  MCV 79.1  PLT 436*   Radiological Exams on Admission: Ct Abdomen Pelvis W Contrast  01/23/2014   CLINICAL DATA:  Nausea, vomiting, history of gastric bypass surgery  EXAM: CT ABDOMEN AND PELVIS WITH CONTRAST  TECHNIQUE: Multidetector CT imaging of the abdomen and pelvis was performed using the standard protocol following bolus administration of intravenous contrast.  CONTRAST:  25mL OMNIPAQUE IOHEXOL 300 MG/ML SOLN, OMNIPAQUE IOHEXOL 300 MG/ML SOLN  COMPARISON:  06/09/2010  FINDINGS: Moderate to large hiatal hernia containing stomach is reidentified. This appears slightly larger than previously, which may be due to ingested material. Internal air-fluid level noted. Lung bases are clear. No pleural effusion.  There is moderate peripancreatic fluid and stranding. No pancreatic ductal dilatation is identified. No focal fluid collection is identified. Multiple peripancreatic lymph nodes are identified, largest 0.6 cm image 29. No common duct or intrahepatic ductal dilatation. The pancreas demonstrates subjective global hypoenhancement but no focal area of definite necrosis allowing for technique.  Liver, gallbladder, kidneys, adrenal glands, and spleen are unremarkable with the exception of mild hepatic hypodensity suggesting steatosis.  Small amount of ascites tracks along the mesentery. No free pelvic fluid. Uterus and ovaries are normal. Bladder is normal. No bowel wall thickening or focal segmental dilatation. Normal appendix, image 59. Mild atheromatous aortic calcification without aneurysm.  No acute osseous abnormality. Disc degenerative change is reidentified at L3-L4 and L4-L5. There is approximately 3 mm retrolisthesis of L4 on L3.  IMPRESSION: Peripancreatic fluid, stranding, and peripancreatic lymph nodes all compatible with acute pancreatitis. No evidence at this time for peripancreatic abscess  formation. Mildly inhomogeneous hypoenhancement of the pancreas may indicate developing necrosis.  No acute intrapelvic abnormality.   Electronically Signed   By: Christiana Pellant M.D.   On: 01/23/2014 02:02       Assessment/Plan Active Problems:   Acute pancreatitis   Alcohol abuse  Acute pancreatitis Secondary to alcohol abuse, will admit the patient, keep n.p.o., aggressive hydration with IV on normal saline at 125 mL per hour. Zofran when necessary for nausea vomiting. Will check lipase in a.m. CT abdomen pelvis reveals inhomogeneous hypoenhancement of pancreas which may indicate developing necrosis. Will start patient empirically on meropenem. Based on the clinical response patient may require a repeat CT in 48 to 72 hours to assess developing necrosis.  Alcohol abuse Patient will be started on CIWA protocol Continue folate and thiamine  Code status: Presumed full code  Family discussion: No family at bedside   Time Spent on Admission: 60 minutes  Quincy Boy S [facility] Pager: [phone] 01/23/2014, 3:33 AM  If 7PM-7AM, please contact night-coverage  [email or web address]  Password [email or web address]

## 2014-01-23 NOTE — Progress Notes (Signed)
ANTIBIOTIC CONSULT NOTE - INITIAL  Pharmacy Consult for meropenem Indication:  Infection of abdominal source  Allergies  Allergen Reactions  . Aspirin     Patient Measurements: Height: 5\' 8"  (172.7 cm) Weight: 170 lb (77.111 kg) IBW/kg (Calculated) : 63.9 Adjusted Body Weight: 68 kg  Vital Signs: Temp: 97.8 F (36.6 C) (07/12 0321) Temp src: Oral (07/12 0321) BP: 145/86 mmHg (07/12 0321) Pulse Rate: 76 (07/12 0321) Intake/Output from previous day:   Intake/Output from this shift:    Labs:  Recent Labs  01/22/14 2356  WBC 12.9*  HGB 11.5*  PLT 436*  CREATININE 0.57   Estimated Creatinine Clearance: 101.1 ml/min (by C-G formula based on Cr of 0.57). No results found for this basename: VANCOTROUGH, VANCOPEAK, VANCORANDOM, GENTTROUGH, GENTPEAK, GENTRANDOM, TOBRATROUGH, TOBRAPEAK, TOBRARND, AMIKACINPEAK, AMIKACINTROU, AMIKACIN,  in the last 72 hours   Microbiology: No results found for this or any previous visit (from the past 720 hour(s)).  Medical History: Past Medical History  Diagnosis Date  . Gastric bypass status for obesity   . Pancreatitis   . Depression     Medications:  Scheduled:  . sodium chloride   Intravenous STAT  . enoxaparin (LOVENOX) injection  40 mg Subcutaneous Q24H  . [START ON 01/25/2014] LORazepam  0-4 mg Intravenous Q12H  . [START ON 01/25/2014] LORazepam  0-4 mg Oral Q12H  . meropenem (MERREM) IV  500 mg Intravenous 3 times per day  . multivitamin with minerals  1 tablet Oral Daily  . thiamine  100 mg Oral Daily   Or  . thiamine  100 mg Intravenous Daily   Infusions:   PRN: acetaminophen, acetaminophen, HYDROmorphone (DILAUDID) injection, HYDROmorphone (DILAUDID) injection, LORazepam, LORazepam, ondansetron (ZOFRAN) IV, ondansetron (ZOFRAN) IV  Assessment: 89yr female with hx of EtOH abuse, previous gastric bypass surgery, pancreatitis - now with sx's of possible abdominal infection.  Will utilize standard regimen of meropenem with  CrCl>6450ml/min.  Goal of Therapy:  Coverage for possible  mixed infection, including anaerobic source.    Plan:  1.  Meropenum 500mg  IV q8h 2.  Monitor for indices of infection & renal function  Scarlett PrestoRochette, Ivalee Strauser E 01/23/2014,4:07 AM

## 2014-01-23 NOTE — Progress Notes (Addendum)
ANTIBIOTIC CONSULT NOTE - Follow up Pharmacy Consult for meropenem Indication:  Infection of abdominal source  Allergies  Allergen Reactions  . Aspirin     Patient Measurements: Height: 5\' 7"  (170.2 cm) Weight: 157 lb 4.8 oz (71.351 kg) IBW/kg (Calculated) : 61.6 Adjusted Body Weight: 68 kg  Vital Signs: Temp: 98.4 F (36.9 C) (07/12 0500) Temp src: Oral (07/12 0500) BP: 134/61 mmHg (07/12 0500) Pulse Rate: 62 (07/12 0500) Intake/Output from previous day:   Intake/Output from this shift:    Labs:  Recent Labs  01/22/14 2356 01/23/14 0547  WBC 12.9* 11.7*  HGB 11.5* 10.0*  PLT 436* 420*  CREATININE 0.57 0.49*   Estimated Creatinine Clearance: 90 ml/min (by C-G formula based on Cr of 0.49). No results found for this basename: VANCOTROUGH, VANCOPEAK, VANCORANDOM, GENTTROUGH, GENTPEAK, GENTRANDOM, TOBRATROUGH, TOBRAPEAK, TOBRARND, AMIKACINPEAK, AMIKACINTROU, AMIKACIN,  in the last 72 hours   Microbiology: No results found for this or any previous visit (from the past 720 hour(s)).  Medical History: Past Medical History  Diagnosis Date  . Gastric bypass status for obesity   . Pancreatitis   . Depression     Medications:  Scheduled:  . sodium chloride   Intravenous STAT  . enoxaparin (LOVENOX) injection  40 mg Subcutaneous Q24H  . [START ON 01/25/2014] LORazepam  0-4 mg Intravenous Q12H  . [START ON 01/25/2014] LORazepam  0-4 mg Oral Q12H  . meropenem (MERREM) IV  1 g Intravenous 3 times per day  . multivitamin with minerals  1 tablet Oral Daily  . thiamine  100 mg Oral Daily   Or  . thiamine  100 mg Intravenous Daily   Infusions:   PRN: acetaminophen, acetaminophen, HYDROmorphone (DILAUDID) injection, HYDROmorphone (DILAUDID) injection, LORazepam, LORazepam, ondansetron (ZOFRAN) IV, ondansetron (ZOFRAN) IV  Assessment: 89yr female with hx of EtOH abuse, previous gastric bypass surgery, pancreatitis - now with sx's of possible abdominal infection.  Will  utilize standard regimen of meropenem with CrCl>5550ml/min.  Goal of Therapy:  Coverage for possible  mixed infection, including anaerobic source.    Plan:  1.  Change Meropenum to 1 GM IV q8h 2.  Monitor for indices of infection & renal function  Raquel Jamesittman, Jamielynn Wigley Bennett 01/23/2014,8:31 AM

## 2014-01-23 NOTE — Progress Notes (Signed)
12:28 PM I agree with HPI/GPe and A/P per Dr. Sharl MaLama  43 y/o ?, known h/o prior ETOH pancreatitis + psuedocyst, GERd, H/o GOP, AoCD, possible Liver hemangioa 11/2011admitted early am 01/23/14  abd pain, N/V.  Drinks ~1 pint vodka daily.   On admit Lipase 953 , Ct showed some hypo-enhancement ? necrosis  HEENT sleepy arousable CHEST clear no added sound CARDIAC S1-S2 no murmur rub or gallop ABDOMEN soft no epigastric tenderness  Patient Active Problem List   Diagnosis Date Noted  . Acute pancreatitis 01/23/2014  . Alcohol abuse 01/23/2014  . Olecranon fracture 05/05/2013   Supportive management including IV fluids, keep holding on clear liquid diet today Repeat Chem-12, lipase in a.m. CIWA 8 last check-continue Ativan protocol Monitor blood pressure is elevated would treat with when necessary medication Anticipate 2-4 day stay.   Jennifer KochJai Jenilyn Magana, MD [facility] [phone]

## 2014-01-24 LAB — COMPREHENSIVE METABOLIC PANEL
ALBUMIN: 2.5 g/dL — AB (ref 3.5–5.2)
ALT: 15 U/L (ref 0–35)
ANION GAP: 11 (ref 5–15)
AST: 34 U/L (ref 0–37)
Alkaline Phosphatase: 94 U/L (ref 39–117)
BUN: 4 mg/dL — AB (ref 6–23)
CO2: 28 mEq/L (ref 19–32)
CREATININE: 0.55 mg/dL (ref 0.50–1.10)
Calcium: 8.1 mg/dL — ABNORMAL LOW (ref 8.4–10.5)
Chloride: 98 mEq/L (ref 96–112)
GFR calc Af Amer: >90 mL/min (ref >90)
GFR calc non Af Amer: >90 mL/min (ref >90)
Glucose, Bld: 78 mg/dL (ref 70–99)
Potassium: 3.6 mEq/L — ABNORMAL LOW (ref 3.7–5.3)
Sodium: 137 mEq/L (ref 137–147)
TOTAL PROTEIN: 6 g/dL (ref 6.0–8.3)
Total Bilirubin: 0.3 mg/dL (ref 0.3–1.2)

## 2014-01-24 LAB — LIPASE, BLOOD: Lipase: 241 U/L — ABNORMAL HIGH (ref 11–59)

## 2014-01-24 MED ORDER — PANTOPRAZOLE SODIUM 40 MG PO TBEC
40.0000 mg | DELAYED_RELEASE_TABLET | Freq: Once | ORAL | Status: AC
  Administered 2014-01-24: 40 mg via ORAL
  Filled 2014-01-24: qty 1

## 2014-01-24 MED ORDER — NICOTINE 21 MG/24HR TD PT24
21.0000 mg | MEDICATED_PATCH | Freq: Every day | TRANSDERMAL | Status: DC
  Administered 2014-01-25 (×2): 21 mg via TRANSDERMAL
  Filled 2014-01-24 (×2): qty 1

## 2014-01-24 MED ORDER — PANTOPRAZOLE SODIUM 40 MG PO TBEC
40.0000 mg | DELAYED_RELEASE_TABLET | Freq: Every day | ORAL | Status: DC
  Administered 2014-01-24 – 2014-01-25 (×2): 40 mg via ORAL
  Filled 2014-01-24 (×2): qty 1
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
## 2014-01-25 LAB — LIPASE, BLOOD: Lipase: 213 U/L — ABNORMAL HIGH (ref 11–59)

## 2014-01-25 MED ORDER — ACETAMINOPHEN-CODEINE #2 300-15 MG PO TABS: 1.0000 | ORAL_TABLET | ORAL | Status: DC | PRN

## 2014-01-25 MED ORDER — NICOTINE 21 MG/24HR TD PT24: 21.0000 mg | MEDICATED_PATCH | Freq: Every day | TRANSDERMAL | Status: DC

## 2014-01-25 MED ORDER — OXYCODONE HCL 5 MG PO TABS
5.0000 mg | ORAL_TABLET | Freq: Four times a day (QID) | ORAL | Status: DC | PRN
  Administered 2014-01-25 (×2): 5 mg via ORAL
  Filled 2014-01-25 (×2): qty 1

## 2014-01-25 NOTE — Progress Notes (Signed)
Patient discharged home.  IV removed - WNL.  Instructed on medications and how to take.  Advised and given info on smoking and drinking cessation.  Instructed to f/u with OP general surgery regarding cyst on head - given telephone numbers of 2 local surgeons.  Verbalizes understanding of DC instructions.  No questions at this time.  Instructed to seek medical assistance if any increase or worsening of pain and symptoms.  Stable to DC at this time.  Left floor via WC with RN assist.

## 2014-01-25 NOTE — Care Management Note (Signed)
    Page 1 of 1   01/25/2014     2:05:11 PM CARE MANAGEMENT NOTE 01/25/2014  Patient:  Albesa SeenHARVEY,Jonell L   Account Number:  [id number]  Date Initiated:  01/25/2014  Documentation initiated by:  Anibal HendersonBOLDEN,Johnatan Baskette  Subjective/Objective Assessment:   Admitted with pancreatitis. pt is from home, is independent, and will return home at D/C     Action/Plan:   no needs identified   Anticipated DC Date:  01/25/2014   Anticipated DC Plan:  HOME/SELF CARE      DC Planning Services  CM consult      Choice offered to / List presented to:             Status of service:  Completed, signed off Medicare Important Message given?   (If response is "NO", the following Medicare IM given date fields will be blank) Date Medicare IM given:   Medicare IM given by:   Date Additional Medicare IM given:   Additional Medicare IM given by:    Discharge Disposition:    Per UR Regulation:  Reviewed for med. necessity/level of care/duration of stay  If discussed at Long Length of Stay Meetings, dates discussed:    Comments:  01/25/14 1400 Anibal HendersonGeneva Brynnan Rodenbaugh RN/CM
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
## 2014-01-26 NOTE — Progress Notes (Signed)
UR chart review completed.  

## 2014-10-14 ENCOUNTER — Emergency Department (HOSPITAL_COMMUNITY): Payer: Self-pay

## 2014-10-14 ENCOUNTER — Inpatient Hospital Stay (HOSPITAL_COMMUNITY)
Admission: EM | Admit: 2014-10-14 | Discharge: 2014-10-18 | DRG: 439 | Disposition: A | Discharge status: Home / Self Care | Payer: Self-pay | Attending: Internal Medicine | Admitting: Internal Medicine

## 2014-10-14 ENCOUNTER — Encounter (HOSPITAL_COMMUNITY): Payer: Self-pay

## 2014-10-14 DIAGNOSIS — F1020 Alcohol dependence, uncomplicated: Secondary | ICD-10-CM | POA: Diagnosis present

## 2014-10-14 DIAGNOSIS — K852 Alcohol induced acute pancreatitis without necrosis or infection: Secondary | ICD-10-CM | POA: Diagnosis present

## 2014-10-14 DIAGNOSIS — K920 Hematemesis: Secondary | ICD-10-CM | POA: Diagnosis present

## 2014-10-14 DIAGNOSIS — F1010 Alcohol abuse, uncomplicated: Secondary | ICD-10-CM | POA: Diagnosis present

## 2014-10-14 DIAGNOSIS — F17200 Nicotine dependence, unspecified, uncomplicated: Secondary | ICD-10-CM | POA: Diagnosis present

## 2014-10-14 DIAGNOSIS — E871 Hypo-osmolality and hyponatremia: Secondary | ICD-10-CM | POA: Diagnosis present

## 2014-10-14 DIAGNOSIS — R52 Pain, unspecified: Secondary | ICD-10-CM

## 2014-10-14 DIAGNOSIS — R945 Abnormal results of liver function studies: Secondary | ICD-10-CM

## 2014-10-14 DIAGNOSIS — F32A Depression, unspecified: Secondary | ICD-10-CM | POA: Diagnosis present

## 2014-10-14 DIAGNOSIS — F17210 Nicotine dependence, cigarettes, uncomplicated: Secondary | ICD-10-CM | POA: Diagnosis present

## 2014-10-14 DIAGNOSIS — F329 Major depressive disorder, single episode, unspecified: Secondary | ICD-10-CM | POA: Diagnosis present

## 2014-10-14 DIAGNOSIS — Z9884 Bariatric surgery status: Secondary | ICD-10-CM

## 2014-10-14 DIAGNOSIS — K859 Acute pancreatitis without necrosis or infection, unspecified: Secondary | ICD-10-CM | POA: Diagnosis present

## 2014-10-14 DIAGNOSIS — K449 Diaphragmatic hernia without obstruction or gangrene: Secondary | ICD-10-CM | POA: Diagnosis present

## 2014-10-14 DIAGNOSIS — D509 Iron deficiency anemia, unspecified: Secondary | ICD-10-CM | POA: Diagnosis present

## 2014-10-14 DIAGNOSIS — R7989 Other specified abnormal findings of blood chemistry: Secondary | ICD-10-CM | POA: Diagnosis present

## 2014-10-14 LAB — I-STAT CHEM 8, ED
BUN: 5 mg/dL — AB (ref 6–23)
Calcium, Ion: 1.11 mmol/L — ABNORMAL LOW (ref 1.12–1.23)
Chloride: 98 mmol/L (ref 96–112)
Creatinine, Ser: 0.4 mg/dL — ABNORMAL LOW (ref 0.50–1.10)
GLUCOSE: 148 mg/dL — AB (ref 70–99)
HCT: 32 % — ABNORMAL LOW (ref 36.0–46.0)
HEMOGLOBIN: 10.9 g/dL — AB (ref 12.0–15.0)
Potassium: 4 mmol/L (ref 3.5–5.1)
Sodium: 132 mmol/L — ABNORMAL LOW (ref 135–145)
TCO2: 21 mmol/L (ref 0–100)

## 2014-10-14 LAB — COMPREHENSIVE METABOLIC PANEL
ALT: 27 U/L (ref 0–35)
ANION GAP: 9 (ref 5–15)
AST: 45 U/L — ABNORMAL HIGH (ref 0–37)
Albumin: 3.4 g/dL — ABNORMAL LOW (ref 3.5–5.2)
Alkaline Phosphatase: 62 U/L (ref 39–117)
BILIRUBIN TOTAL: 1.1 mg/dL (ref 0.3–1.2)
BUN: 8 mg/dL (ref 6–23)
CALCIUM: 8.6 mg/dL (ref 8.4–10.5)
CO2: 24 mmol/L (ref 19–32)
Chloride: 98 mmol/L (ref 96–112)
Creatinine, Ser: 0.49 mg/dL — ABNORMAL LOW (ref 0.50–1.10)
GFR calc Af Amer: >90 mL/min (ref >90)
GFR calc non Af Amer: >90 mL/min (ref >90)
GLUCOSE: 150 mg/dL — AB (ref 70–99)
Potassium: 4 mmol/L (ref 3.5–5.1)
Sodium: 131 mmol/L — ABNORMAL LOW (ref 135–145)
Total Protein: 6.6 g/dL (ref 6.0–8.3)

## 2014-10-14 LAB — CBC WITH DIFFERENTIAL/PLATELET
BASOS ABS: 0.1 10*3/uL (ref 0.0–0.1)
Basophils Relative: 0 % (ref 0–1)
Eosinophils Absolute: 0 10*3/uL (ref 0.0–0.7)
Eosinophils Relative: 0 % (ref 0–5)
HCT: 27.3 % — ABNORMAL LOW (ref 36.0–46.0)
Hemoglobin: 8.4 g/dL — ABNORMAL LOW (ref 12.0–15.0)
LYMPHS ABS: 0.6 10*3/uL — AB (ref 0.7–4.0)
LYMPHS PCT: 4 % — AB (ref 12–46)
MCH: 22.2 pg — AB (ref 26.0–34.0)
MCHC: 30.8 g/dL (ref 30.0–36.0)
MCV: 72.2 fL — AB (ref 78.0–100.0)
Monocytes Absolute: 1 10*3/uL (ref 0.1–1.0)
Monocytes Relative: 7 % (ref 3–12)
Neutro Abs: 12.6 10*3/uL — ABNORMAL HIGH (ref 1.7–7.7)
Neutrophils Relative %: 89 % — ABNORMAL HIGH (ref 43–77)
Platelets: 239 10*3/uL (ref 150–400)
RBC: 3.78 MIL/uL — ABNORMAL LOW (ref 3.87–5.11)
RDW: 21 % — ABNORMAL HIGH (ref 11.5–15.5)
WBC: 14.2 10*3/uL — ABNORMAL HIGH (ref 4.0–10.5)

## 2014-10-14 LAB — LIPASE, BLOOD: Lipase: 685 U/L — ABNORMAL HIGH (ref 11–59)

## 2014-10-14 LAB — ETHANOL: Alcohol, Ethyl (B): <5 mg/dL (ref 0–9)

## 2014-10-14 MED ORDER — HYDROMORPHONE HCL 1 MG/ML IJ SOLN
1.0000 mg | Freq: Once | INTRAMUSCULAR | Status: AC
  Administered 2014-10-14: 1 mg via INTRAVENOUS
  Filled 2014-10-14: qty 1

## 2014-10-14 MED ORDER — ONDANSETRON HCL 4 MG/2ML IJ SOLN
4.0000 mg | Freq: Once | INTRAMUSCULAR | Status: AC
  Administered 2014-10-14: 4 mg via INTRAVENOUS
  Filled 2014-10-14: qty 2

## 2014-10-14 MED ORDER — IOHEXOL 300 MG/ML  SOLN
100.0000 mL | Freq: Once | INTRAMUSCULAR | Status: AC | PRN
Start: 2014-10-14 — End: 2014-10-14
  Administered 2014-10-14: 100 mL via INTRAVENOUS

## 2014-10-14 MED ORDER — SODIUM CHLORIDE 0.9 % IV SOLN
INTRAVENOUS | Status: DC
  Administered 2014-10-15: via INTRAVENOUS

## 2014-10-14 MED ORDER — SODIUM CHLORIDE 0.9 % IV BOLUS (SEPSIS)
1000.0000 mL | Freq: Once | INTRAVENOUS | Status: AC
  Administered 2014-10-14: 1000 mL via INTRAVENOUS

## 2014-10-14 NOTE — ED Notes (Signed)
Pt in by ems with c/o vomiting x 10 hours, states she drank heavily last night.   Pt admits to regular etoh abuse.

## 2014-10-14 NOTE — ED Provider Notes (Signed)
CSN: [id number]     Arrival date & time 10/14/14  2058 History  This chart was scribed for Bethann Berkshire, MD by Tanda Rockers, ED Scribe. This patient was seen in room APA19/APA19 and the patient's care was started at 9:08 PM.    Chief Complaint  Patient presents with  . Abdominal Pain  . Emesis    Patient is a 44 y.o. female presenting with abdominal pain and vomiting. The history is provided by the patient. No language interpreter was used.  Abdominal Pain Pain location:  Epigastric Pain radiates to:  Suprapubic region and periumbilical region Pain severity:  Unable to specify Onset quality:  Sudden Timing:  Unable to specify Progression:  Unchanged Context: alcohol use   Associated symptoms: hematemesis, nausea and vomiting   Associated symptoms: no chest pain, no cough, no diarrhea, no fatigue and no hematuria   Risk factors: alcohol abuse   Emesis Associated symptoms: abdominal pain   Associated symptoms: no diarrhea and no headaches      HPI Comments: Jennifer [name] is a 44 y.o. female brought in by ambulance, with PMHx EtOH abuse and Pancreatitis who presents to the Emergency Department complaining of nausea and vomiting that began earlier today around lunch time. Pt mentions that she had small flecks of blood in her vomit earlier today as well. Pt also complains of epigastric abdominal pain that has been radiating down to her periumbilical egion. Per triage report, pt admitted to drinking heavily last night. She denies diarrhea, fever, chills, or any other symptoms.    Past Medical History  Diagnosis Date  . Gastric bypass status for obesity   . Pancreatitis   . Depression    Past Surgical History  Procedure Laterality Date  . Gastric bypass     No family history on file. History  Substance Use Topics  . Smoking status: Current Every Day Smoker -- 0.50 packs/day    Types: Cigarettes  . Smokeless tobacco: Not on file  . Alcohol Use: Yes   OB History    No data  available     Review of Systems  Constitutional: Negative for appetite change and fatigue.  HENT: Negative for congestion, ear discharge and sinus pressure.   Eyes: Negative for discharge.  Respiratory: Negative for cough.   Cardiovascular: Negative for chest pain.  Gastrointestinal: Positive for nausea, vomiting, abdominal pain and hematemesis. Negative for diarrhea.  Genitourinary: Negative for frequency and hematuria.  Musculoskeletal: Negative for back pain.  Skin: Negative for rash.  Neurological: Negative for seizures and headaches.  Psychiatric/Behavioral: Negative for hallucinations.      Allergies  Aspirin  Home Medications   Prior to Admission medications   Medication Sig Start Date End Date Taking? Authorizing Provider  acetaminophen-codeine (TYLENOL #2) 300-15 MG per tablet Take 1 tablet by mouth every 4 (four) hours as needed for moderate pain. 01/25/14   Rhetta Mura, MD  Multiple Vitamin (MULTIVITAMIN WITH MINERALS) TABS tablet Take 1 tablet by mouth daily.    Historical Provider, MD  nicotine (NICODERM CQ - DOSED IN MG/24 HOURS) 21 mg/24hr patch Place 1 patch (21 mg total) onto the skin daily. 01/25/14   Rhetta Mura, MD  omeprazole (PRILOSEC) 20 MG capsule Take 20 mg by mouth every morning.     Historical Provider, MD   Triage Vitals: BP 138/79 mmHg  Pulse 65  Temp(Src) 98.8 F (37.1 C) (Oral)  Resp 20  Ht  (1.702 m)  Wt 170 lb (77.111 kg)  BMI 26.62  kg/m2  SpO2 100%   Physical Exam  Constitutional: She is oriented to person, place, and time. She appears well-developed and well-nourished.  HENT:  Head: Normocephalic and atraumatic.  Eyes: Conjunctivae and EOM are normal. No scleral icterus.  Neck: Neck supple. No thyromegaly present.  Cardiovascular: Normal rate, regular rhythm and normal heart sounds.  Exam reveals no gallop and no friction rub.   No murmur heard. Pulmonary/Chest: Effort normal and breath sounds normal. No stridor.  She has no wheezes. She has no rales. She exhibits no tenderness.  Abdominal: She exhibits no distension. There is no tenderness. There is no rebound.  Musculoskeletal: Normal range of motion. She exhibits no edema.  Lymphadenopathy:    She has no cervical adenopathy.  Neurological: She is oriented to person, place, and time. She exhibits normal muscle tone. Coordination normal.  Skin: No rash noted. No erythema.  Psychiatric: She has a normal mood and affect. Her behavior is normal.    ED Course  Procedures (including critical care time)  DIAGNOSTIC STUDIES: Oxygen Saturation is 100% on RA, normal by my interpretation.    COORDINATION OF CARE: 9:12 PM-Discussed treatment plan which includes EtOH, CMP, Lipase, CBC with pt at bedside and pt agreed to plan.   Labs Review Labs Reviewed  CBC WITH DIFFERENTIAL/PLATELET  COMPREHENSIVE METABOLIC PANEL  LIPASE, BLOOD  ETHANOL    Imaging Review No results found.   EKG Interpretation None      MDM   Final diagnoses:  None    Pancreatitis,  Admit  The chart was scribed for me under my direct supervision.  I personally performed the history, physical, and medical decision making and all procedures in the evaluation of this patient.Bethann Berkshire.     Arly Salminen, MD 10/14/14 [phone]

## 2014-10-15 ENCOUNTER — Encounter (HOSPITAL_COMMUNITY): Payer: Self-pay | Admitting: Internal Medicine

## 2014-10-15 DIAGNOSIS — Z9884 Bariatric surgery status: Secondary | ICD-10-CM

## 2014-10-15 DIAGNOSIS — K852 Alcohol induced acute pancreatitis without necrosis or infection: Secondary | ICD-10-CM | POA: Diagnosis present

## 2014-10-15 DIAGNOSIS — F1020 Alcohol dependence, uncomplicated: Secondary | ICD-10-CM

## 2014-10-15 DIAGNOSIS — D509 Iron deficiency anemia, unspecified: Secondary | ICD-10-CM | POA: Diagnosis present

## 2014-10-15 DIAGNOSIS — F32A Depression, unspecified: Secondary | ICD-10-CM | POA: Diagnosis present

## 2014-10-15 DIAGNOSIS — F1010 Alcohol abuse, uncomplicated: Secondary | ICD-10-CM

## 2014-10-15 DIAGNOSIS — F633 Trichotillomania: Secondary | ICD-10-CM | POA: Insufficient documentation

## 2014-10-15 DIAGNOSIS — F329 Major depressive disorder, single episode, unspecified: Secondary | ICD-10-CM | POA: Diagnosis present

## 2014-10-15 LAB — COMPREHENSIVE METABOLIC PANEL
ALK PHOS: 65 U/L (ref 39–117)
ALT: 23 U/L (ref 0–35)
AST: 36 U/L (ref 0–37)
Albumin: 3.3 g/dL — ABNORMAL LOW (ref 3.5–5.2)
Anion gap: 8 (ref 5–15)
BUN: 7 mg/dL (ref 6–23)
CO2: 25 mmol/L (ref 19–32)
Calcium: 8.4 mg/dL (ref 8.4–10.5)
Chloride: 99 mmol/L (ref 96–112)
Creatinine, Ser: 0.5 mg/dL (ref 0.50–1.10)
GFR calc non Af Amer: >90 mL/min (ref >90)
Glucose, Bld: 123 mg/dL — ABNORMAL HIGH (ref 70–99)
Potassium: 3.8 mmol/L (ref 3.5–5.1)
Sodium: 132 mmol/L — ABNORMAL LOW (ref 135–145)
Total Bilirubin: 1.2 mg/dL (ref 0.3–1.2)
Total Protein: 6.3 g/dL (ref 6.0–8.3)

## 2014-10-15 LAB — CBC
HEMATOCRIT: 28.9 % — AB (ref 36.0–46.0)
Hemoglobin: 8.6 g/dL — ABNORMAL LOW (ref 12.0–15.0)
MCH: 21.9 pg — ABNORMAL LOW (ref 26.0–34.0)
MCHC: 29.8 g/dL — ABNORMAL LOW (ref 30.0–36.0)
MCV: 73.7 fL — ABNORMAL LOW (ref 78.0–100.0)
Platelets: 245 10*3/uL (ref 150–400)
RBC: 3.92 MIL/uL (ref 3.87–5.11)
RDW: 21.4 % — ABNORMAL HIGH (ref 11.5–15.5)
WBC: 14.8 10*3/uL — AB (ref 4.0–10.5)

## 2014-10-15 LAB — TSH: TSH: 4.124 u[IU]/mL (ref 0.350–4.500)

## 2014-10-15 MED ORDER — PANTOPRAZOLE SODIUM 40 MG IV SOLR
40.0000 mg | Freq: Two times a day (BID) | INTRAVENOUS | Status: DC
  Administered 2014-10-15 – 2014-10-17 (×6): 40 mg via INTRAVENOUS
  Filled 2014-10-15 (×6): qty 40

## 2014-10-15 MED ORDER — HEPARIN SODIUM (PORCINE) 5000 UNIT/ML IJ SOLN
5000.0000 [IU] | Freq: Three times a day (TID) | INTRAMUSCULAR | Status: DC
  Administered 2014-10-15 – 2014-10-18 (×10): 5000 [IU] via SUBCUTANEOUS
  Filled 2014-10-15 (×9): qty 1

## 2014-10-15 MED ORDER — KCL IN DEXTROSE-NACL 20-5-0.9 MEQ/L-%-% IV SOLN
INTRAVENOUS | Status: DC
  Administered 2014-10-15 – 2014-10-17 (×7): via INTRAVENOUS

## 2014-10-15 MED ORDER — FOLIC ACID 1 MG PO TABS
1.0000 mg | ORAL_TABLET | Freq: Every day | ORAL | Status: DC
  Administered 2014-10-15 – 2014-10-18 (×4): 1 mg via ORAL
  Filled 2014-10-15 (×4): qty 1

## 2014-10-15 MED ORDER — VITAMIN B-1 100 MG PO TABS
100.0000 mg | ORAL_TABLET | Freq: Every day | ORAL | Status: DC
  Administered 2014-10-15 – 2014-10-18 (×4): 100 mg via ORAL
  Filled 2014-10-15 (×3): qty 1

## 2014-10-15 MED ORDER — ONDANSETRON HCL 4 MG PO TABS: 4.0000 mg | ORAL_TABLET | Freq: Four times a day (QID) | ORAL | Status: DC | PRN

## 2014-10-15 MED ORDER — NICOTINE 21 MG/24HR TD PT24
21.0000 mg | MEDICATED_PATCH | Freq: Every day | TRANSDERMAL | Status: DC
  Administered 2014-10-15 – 2014-10-18 (×4): 21 mg via TRANSDERMAL
  Filled 2014-10-15 (×4): qty 1

## 2014-10-15 MED ORDER — ONDANSETRON HCL 4 MG/2ML IJ SOLN
4.0000 mg | Freq: Four times a day (QID) | INTRAMUSCULAR | Status: DC | PRN
  Administered 2014-10-15 – 2014-10-17 (×8): 4 mg via INTRAVENOUS
  Filled 2014-10-15 (×8): qty 2

## 2014-10-15 MED ORDER — LORAZEPAM 1 MG PO TABS
0.0000 mg | ORAL_TABLET | Freq: Four times a day (QID) | ORAL | Status: DC
  Administered 2014-10-16: 1 mg via ORAL
  Administered 2014-10-16 (×2): 2 mg via ORAL
  Filled 2014-10-15 (×2): qty 2
  Filled 2014-10-15: qty 1

## 2014-10-15 MED ORDER — LORAZEPAM 2 MG/ML IJ SOLN
1.0000 mg | Freq: Four times a day (QID) | INTRAMUSCULAR | Status: AC | PRN
  Administered 2014-10-15 – 2014-10-17 (×5): 1 mg via INTRAVENOUS
  Filled 2014-10-15 (×5): qty 1

## 2014-10-15 MED ORDER — HYDROMORPHONE HCL 1 MG/ML IJ SOLN
1.0000 mg | INTRAMUSCULAR | Status: DC | PRN
  Administered 2014-10-15 – 2014-10-17 (×16): 1 mg via INTRAVENOUS
  Filled 2014-10-15 (×16): qty 1

## 2014-10-15 MED ORDER — LORAZEPAM 1 MG PO TABS
1.0000 mg | ORAL_TABLET | Freq: Four times a day (QID) | ORAL | Status: AC | PRN
  Administered 2014-10-15 – 2014-10-16 (×2): 1 mg via ORAL
  Filled 2014-10-15 (×2): qty 1

## 2014-10-15 MED ORDER — THIAMINE HCL 100 MG/ML IJ SOLN: 100.0000 mg | Freq: Every day | INTRAMUSCULAR | Status: DC

## 2014-10-15 MED ORDER — LORAZEPAM 1 MG PO TABS: 0.0000 mg | ORAL_TABLET | Freq: Two times a day (BID) | ORAL | Status: DC

## 2014-10-15 MED ORDER — ADULT MULTIVITAMIN W/MINERALS CH
1.0000 | ORAL_TABLET | Freq: Every day | ORAL | Status: DC
  Administered 2014-10-15 – 2014-10-18 (×4): 1 via ORAL
  Filled 2014-10-15 (×4): qty 1

## 2014-10-15 NOTE — Progress Notes (Signed)
PROGRESS NOTE  Albesa Seenarry L Mancia ZOX:[id number] DOB: 1970-08-09 DOA: 10/14/2014 [facility]  Summary: 44 year old woman presented with vomiting, abdominal pain after binge drinking. Admitted for acute alcoholic pancreatitis.  Assessment/Plan: 1. Acute alcoholic pancreatitis with vomiting and small volume hematemesis. No further blood. Improved symptomatically. 2. Alcohol dependence, pint per day. No evidence of withdrawal. 3. Hyponatremia. Likely secondary to alcohol abuse. 4. Microcytic anemia. Suspect iron deficiency given her history of gastric bypass. 5. Status post gastric bypass for obesity 6. Depression   Overall stable, stable hemodynamics, still has significant pain. Continue NPO, antiemetics, analgesics, IVF.  Lipase, CBC in AM, CMP in AM. Treat with iron on discharge.  CIWA.  Send urine drug screen  Screen HIV per CDC guidelines  Code Status: full code DVT prophylaxis: heparin Family Communication: none Disposition Plan: home  Brendia Sacksaniel Bellamy Judson, MD  [facility]  Pager [phone] If 7PM-7AM, please contact night-coverage at [email or web address], password [email or web address] 10/15/2014, 2:17 PM  LOS: 0 days   Consultants:    Procedures:    Antibiotics:    HPI/Subjective: Feels better today but still has moderate epigastric pain radiating to left and around to back. Some nausea and vomiting. Passing flatus.  Objective: Filed Vitals:   10/14/14 2200 10/14/14 2341 10/15/14 0129 10/15/14 0529  BP: 160/72 140/76 158/81 139/86  Pulse: 63 86 65 98  Temp:  98.5 F (36.9 C) 98.6 F (37 C) 98.2 F (36.8 C)  TempSrc:  Oral Oral Oral  Resp:  20 20 18   Height:   5\' 7"  (1.702 m)   Weight:   77.1 kg (169 lb 15.6 oz)   SpO2: 99% 97% 97% 97%    Intake/Output Summary (Last 24 hours) at 10/15/14 1417 Last data filed at 10/15/14 1129  Gross per 24 hour  Intake 1237.5 ml  Output    200 ml  Net 1037.5 ml     Filed Weights   10/14/14 2104 10/15/14 0129   Weight: 77.111 kg (170 lb) 77.1 kg (169 lb 15.6 oz)    Exam:     Afebrile, vital signs stable. No hypoxia. General:  Appears calm and comfortable Cardiovascular: RRR, no m/r/g. No LE edema. Respiratory: CTA bilaterally, no w/r/r. Normal respiratory effort. Abdomen: soft, positive bowel sounds. Moderate epigastric pain, no guarding. Psychiatric: grossly normal mood and affect, speech fluent and appropriate  New data reviewed:  Sodium unchanged, 132. Basic metabolic panel otherwise unremarkable.  AST has returned to normal.   Leukocytosis without change, 14.8   Hemoglobin 8.6   Pertinent data since admission:  Lipase 685  TSH normal  Serum alcohol was negative  CT abdomen and pelvis 4/1: Acute pancreatitis without evidence of pseudocyst or devascularization. Stable. Esophageal hernia. Diffuse fatty infiltration of the liver.  Pending data:    Scheduled Meds: . folic acid  1 mg Oral Daily  . heparin  5,000 Units Subcutaneous 3 times per day  . LORazepam  0-4 mg Oral Q6H   Followed by  . [START ON 10/17/2014] LORazepam  0-4 mg Oral Q12H  . multivitamin with minerals  1 tablet Oral Daily  . nicotine  21 mg Transdermal Daily  . pantoprazole (PROTONIX) IV  40 mg Intravenous Q12H  . thiamine  100 mg Oral Daily   Or  . thiamine  100 mg Intravenous Daily   Continuous Infusions: . dextrose 5 % and 0.9 % NaCl with KCl 20 mEq/L 125 mL/hr at 10/15/14 1215    Principal Problem:   Acute alcoholic pancreatitis Active  Problems:   Alcohol dependence   Depression   Trichotillomania   Microcytic anemia   Gastric bypass status for obesity   Time spent 20 minutes
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
## 2014-10-16 DIAGNOSIS — R945 Abnormal results of liver function studies: Secondary | ICD-10-CM

## 2014-10-16 DIAGNOSIS — R7989 Other specified abnormal findings of blood chemistry: Secondary | ICD-10-CM | POA: Diagnosis present

## 2014-10-16 LAB — CBC
HCT: 25.4 % — ABNORMAL LOW (ref 36.0–46.0)
Hemoglobin: 7.7 g/dL — ABNORMAL LOW (ref 12.0–15.0)
MCH: 22.7 pg — ABNORMAL LOW (ref 26.0–34.0)
MCHC: 30.3 g/dL (ref 30.0–36.0)
MCV: 74.9 fL — ABNORMAL LOW (ref 78.0–100.0)
PLATELETS: 162 10*3/uL (ref 150–400)
RBC: 3.39 MIL/uL — ABNORMAL LOW (ref 3.87–5.11)
RDW: 21.9 % — AB (ref 11.5–15.5)
WBC: 8 10*3/uL (ref 4.0–10.5)

## 2014-10-16 LAB — COMPREHENSIVE METABOLIC PANEL
ALT: 106 U/L — AB (ref 0–35)
AST: 337 U/L — AB (ref 0–37)
Albumin: 2.6 g/dL — ABNORMAL LOW (ref 3.5–5.2)
Alkaline Phosphatase: 135 U/L — ABNORMAL HIGH (ref 39–117)
Anion gap: 5 (ref 5–15)
BILIRUBIN TOTAL: 1 mg/dL (ref 0.3–1.2)
BUN: 5 mg/dL — AB (ref 6–23)
CALCIUM: 7.9 mg/dL — AB (ref 8.4–10.5)
CO2: 26 mmol/L (ref 19–32)
Chloride: 106 mmol/L (ref 96–112)
Creatinine, Ser: 0.49 mg/dL — ABNORMAL LOW (ref 0.50–1.10)
GFR calc non Af Amer: >90 mL/min (ref >90)
Glucose, Bld: 87 mg/dL (ref 70–99)
Potassium: 3.6 mmol/L (ref 3.5–5.1)
Sodium: 137 mmol/L (ref 135–145)
Total Protein: 5.4 g/dL — ABNORMAL LOW (ref 6.0–8.3)

## 2014-10-16 LAB — LIPASE, BLOOD: LIPASE: 125 U/L — AB (ref 11–59)

## 2014-10-16 NOTE — Progress Notes (Signed)
PROGRESS NOTE  Jennifer [name] EAV:[id number] DOB: 1970/09/18 DOA: 10/14/2014 [facility]  Summary: 44 year old woman presented with vomiting, abdominal pain after binge drinking. Admitted for acute alcoholic pancreatitis.  Assessment/Plan: 1. Acute alcoholic pancreatitis with vomiting and small volume hematemesis (resolved). Lipase trending down but still symptomatic. Not ready to advance diet. 2. Alcohol dependence, pint per day. No signs/symptoms of withdrawal. Continue CIWA. 3. Elevated AST, ALT, alkaline phosphatase. Etiology and significance unclear.  4. Hyponatremia. Resolved. Likely secondary to alcohol abuse. 5. Microcytic anemia. Suspect iron deficiency given her history of gastric bypass. Suspect drop is dilutional in nature. 6. Status post gastric bypass for obesity. Chronic paraesophageal hernia without complicating features per CT. 7. Depression   No change symptomatically. Remains afebrile with stable vitals, adequate UOP, lipase trending down. Hgb down <1 gm, likely dilution. No evidence of bleeding. WBC has normalized.  Plan continue supportive care, IVF, analgesics, antiemetics.  CMP, lipase, CBC, anemia panel in AM  Code Status: full code DVT prophylaxis: heparin Family Communication: none Disposition Plan: home  Brendia Sacksaniel Goodrich, MD  [facility]  Pager [phone] If 7PM-7AM, please contact night-coverage at [email or web address], password [email or web address] 10/16/2014, 12:37 PM  LOS: 1 day   Consultants:    Procedures:    Antibiotics:    HPI/Subjective: Feels about the same, still having some heaves and nausea. Epigastric abdominal pain radiating to left flank.   Objective: Filed Vitals:   10/15/14 1450 10/15/14 1512 10/15/14 2106 10/16/14 0605  BP: 131/79 118/63 114/63 103/65  Pulse: 74 73 72 98  Temp: 98.5 F (36.9 C) 98.4 F (36.9 C) 98.8 F (37.1 C) 98.7 F (37.1 C)  TempSrc: Oral Oral Oral Oral  Resp: 18 17 17 17   Height:        Weight:      SpO2: 100% 97% 94% 98%    Intake/Output Summary (Last 24 hours) at 10/16/14 1237 Last data filed at 10/16/14 0856  Gross per 24 hour  Intake 441.67 ml  Output    750 ml  Net -308.33 ml     Filed Weights   10/14/14 2104 10/15/14 0129  Weight: 77.111 kg (170 lb) 77.1 kg (169 lb 15.6 oz)    Exam:     Afebrile, VSS. No hypoxia. General: Appears calm and comfortable ENT: grossly normal hearing Cardiovascular: RRR, no m/r/g. No LE edema. Telemetry: ST, no arrhythmias  Respiratory: CTA bilaterally, no w/r/r. Normal respiratory effort. Abdomen: soft, non-distended, mild epigastric pain. Psychiatric: grossly normal mood and affect, speech fluent and appropriate  New data reviewed:  Lipase trending down  Sodium normal  AST and ALT increased  Leukocytosis resolved  Hemoglobin slightly lower, 7.7  Pertinent data since admission:  Lipase 685 >> 125  AST 337  ALT 106  Hgb 8.6 >> 7.7  Alkaline phosphatase 135  TSH normal  Serum alcohol was negative  CT abdomen and pelvis 4/1: Acute pancreatitis without evidence of pseudocyst or devascularization. Stable. Esophageal hernia. Diffuse fatty infiltration of the liver.  Pending data:    Scheduled Meds: . folic acid  1 mg Oral Daily  . heparin  5,000 Units Subcutaneous 3 times per day  . LORazepam  0-4 mg Oral Q6H   Followed by  . [START ON 10/17/2014] LORazepam  0-4 mg Oral Q12H  . multivitamin with minerals  1 tablet Oral Daily  . nicotine  21 mg Transdermal Daily  . pantoprazole (PROTONIX) IV  40 mg Intravenous Q12H  . thiamine  100 mg Oral Daily  Or  . thiamine  100 mg Intravenous Daily   Continuous Infusions: . dextrose 5 % and 0.9 % NaCl with KCl 20 mEq/L 125 mL/hr at 10/16/14 1610    Principal Problem:   Acute alcoholic pancreatitis Active Problems:   Alcohol dependence   Depression   Microcytic anemia   Gastric bypass status for obesity   Elevated LFTs   Time spent 25  minutes

## 2014-10-16 NOTE — Progress Notes (Signed)
Utilization review Completed Zakar Brosch RN BSN   

## 2014-10-17 DIAGNOSIS — F17200 Nicotine dependence, unspecified, uncomplicated: Secondary | ICD-10-CM | POA: Diagnosis present

## 2014-10-17 LAB — COMPREHENSIVE METABOLIC PANEL
ALK PHOS: 127 U/L — AB (ref 39–117)
ALT: 81 U/L — ABNORMAL HIGH (ref 0–35)
ANION GAP: 5 (ref 5–15)
AST: 115 U/L — ABNORMAL HIGH (ref 0–37)
Albumin: 2.6 g/dL — ABNORMAL LOW (ref 3.5–5.2)
BUN: 5 mg/dL — AB (ref 6–23)
CHLORIDE: 107 mmol/L (ref 96–112)
CO2: 24 mmol/L (ref 19–32)
CREATININE: 0.5 mg/dL (ref 0.50–1.10)
Calcium: 7.8 mg/dL — ABNORMAL LOW (ref 8.4–10.5)
GFR calc Af Amer: >90 mL/min (ref >90)
Glucose, Bld: 108 mg/dL — ABNORMAL HIGH (ref 70–99)
POTASSIUM: 3.5 mmol/L (ref 3.5–5.1)
Sodium: 136 mmol/L (ref 135–145)
Total Bilirubin: 0.9 mg/dL (ref 0.3–1.2)
Total Protein: 5.5 g/dL — ABNORMAL LOW (ref 6.0–8.3)

## 2014-10-17 LAB — RAPID URINE DRUG SCREEN, HOSP PERFORMED
Amphetamines: NOT DETECTED
BENZODIAZEPINES: POSITIVE — AB
Barbiturates: NOT DETECTED
Cocaine: NOT DETECTED
OPIATES: POSITIVE — AB
Tetrahydrocannabinol: NOT DETECTED

## 2014-10-17 LAB — IRON AND TIBC
Iron: 10 ug/dL — ABNORMAL LOW (ref 42–145)
Saturation Ratios: 4 % — ABNORMAL LOW (ref 20–55)
TIBC: 249 ug/dL — AB (ref 250–470)
UIBC: 239 ug/dL (ref 125–400)

## 2014-10-17 LAB — CBC
HCT: 23.5 % — ABNORMAL LOW (ref 36.0–46.0)
Hemoglobin: 7.1 g/dL — ABNORMAL LOW (ref 12.0–15.0)
MCH: 22.8 pg — ABNORMAL LOW (ref 26.0–34.0)
MCHC: 30.2 g/dL (ref 30.0–36.0)
MCV: 75.3 fL — ABNORMAL LOW (ref 78.0–100.0)
Platelets: 156 K/uL (ref 150–400)
RBC: 3.12 MIL/uL — ABNORMAL LOW (ref 3.87–5.11)
RDW: 22 % — ABNORMAL HIGH (ref 11.5–15.5)
WBC: 5.4 K/uL (ref 4.0–10.5)

## 2014-10-17 LAB — GLUCOSE, CAPILLARY: Glucose-Capillary: 113 mg/dL — ABNORMAL HIGH (ref 70–99)

## 2014-10-17 LAB — FERRITIN: Ferritin: 36 ng/mL (ref 10–291)

## 2014-10-17 LAB — RETICULOCYTES
RBC.: 3.12 MIL/uL — ABNORMAL LOW (ref 3.87–5.11)
RETIC COUNT ABSOLUTE: 46.8 10*3/uL (ref 19.0–186.0)
RETIC CT PCT: 1.5 % (ref 0.4–3.1)

## 2014-10-17 LAB — LIPASE, BLOOD: Lipase: 63 U/L — ABNORMAL HIGH (ref 11–59)

## 2014-10-17 LAB — FOLATE: Folate: 17 ng/mL

## 2014-10-17 LAB — HIV ANTIBODY (ROUTINE TESTING W REFLEX): HIV SCREEN 4TH GENERATION: NONREACTIVE

## 2014-10-17 LAB — VITAMIN B12: Vitamin B-12: 751 pg/mL (ref 211–911)

## 2014-10-17 MED ORDER — PANTOPRAZOLE SODIUM 40 MG PO TBEC
40.0000 mg | DELAYED_RELEASE_TABLET | Freq: Every day | ORAL | Status: DC
  Administered 2014-10-18: 40 mg via ORAL
  Filled 2014-10-17: qty 1

## 2014-10-17 MED ORDER — OXYCODONE HCL 5 MG PO TABS
5.0000 mg | ORAL_TABLET | Freq: Four times a day (QID) | ORAL | Status: DC | PRN
  Administered 2014-10-17 – 2014-10-18 (×4): 5 mg via ORAL
  Filled 2014-10-17 (×4): qty 1

## 2014-10-17 NOTE — Plan of Care (Signed)
Problem: Phase I Progression Outcomes Goal: OOB as tolerated unless otherwise ordered Outcome: Completed/Met Date Met:  10/17/14 Patient able to ambulate to bathroom with standby assist. Goal: Nausea/vomiting controlled after medication Outcome: Completed/Met Date Met:  10/17/14 Patient denies any nausea or vomiting at this time.

## 2014-10-17 NOTE — Progress Notes (Signed)
[facility] PROGRESS NOTE  Jennifer [name]:[id number] DOB: [birth year] DOA: 10/14/2014 PCP: [facility]  Assessment/Plan: 1. Acute alcoholic pancreatitis with vomiting and small volume hematemesis (resolved). Pain has "decreased". Lipase 63 this am down from 685 on admission. She is afebrile no leukocytosis. Tolerating clear liquids. Will advance diet as tolerated 2. Alcohol dependence, pint per day. No signs/symptoms of withdrawal. CIWA score 3. No ativan needed.  Speech slow and deliberate. All responses slow. Attempting to eat soup with fork. Dilaudid discontinued and oxycodone started.  3. Elevated AST, ALT, alkaline phosphatase. Trending down today.  Etiology and significance unclear.  4. Hyponatremia. Resolved. Likely secondary to alcohol abuse. 5. Microcytic anemia. Suspect iron deficiency given her history of gastric bypass. Anemia panel pending. Suspect drop is dilutional in nature. No s/sx bleeding 6. Status post gastric bypass for obesity. Chronic paraesophageal hernia without complicating features per CT. 7. Depression: stable at baseline 8. Tobacco use: cessation counseling. Nicotine patch  Code Status: full Family Communication: none present Disposition Plan: home when ready hopefully 24-48 hours   Consultants:  none  Procedures:  none  Antibiotics:    HPI/Subjective: Sitting up eating clear liquids. Reports pain "decreasing". No nausea/vomiting  Objective: Filed Vitals:   10/17/14 0616  BP: 106/58  Pulse: 56  Temp: 98.9 F (37.2 C)  Resp: 18    Intake/Output Summary (Last 24 hours) at 10/17/14 1423 Last data filed at 10/17/14 0617  Gross per 24 hour  Intake 2668.75 ml  Output    500 ml  Net 2168.75 ml   Filed Weights   10/14/14 2104 10/15/14 0129  Weight: 77.111 kg (170 lb) 77.1 kg (169 lb 15.6 oz)    Exam:   General:  Well nourished appears comfortable   Cardiovascular: RRR no m/g/r no LE edema  Respiratory:  normal effort BS somewhat distant but clear bilaterally no wheeze  Abdomen: non-distended +BS soft mild pain upper right quadrant  Musculoskeletal: no clubbing or cyanosis   Data Reviewed: Basic Metabolic Panel:  Recent Labs Lab 10/14/14 2125 10/14/14 2139 10/15/14 0655 10/16/14 0503 10/17/14 0724  NA 131* 132* 132* 137 136  K 4.0 4.0 3.8 3.6 3.5  CL 98 98 99 106 107  CO2 24  --  GLUCOSE 150* 148* 123* 87 108*  BUN 8 5* 7 5* 5*  CREATININE 0.49* 0.40* 0.50 0.49* 0.50  CALCIUM 8.6  --  8.4 7.9* 7.8*   Liver Function Tests:  Recent Labs Lab 10/14/14 2125 10/15/14 0655 10/16/14 0503 10/17/14 0724  AST 45* 36 337* 115*  ALT 27 23 106* 81*  ALKPHOS 62 65 135* 127*  BILITOT 1.1 1.2 1.0 0.9  PROT 6.6 6.3 5.4* 5.5*  ALBUMIN 3.4* 3.3* 2.6* 2.6*    Recent Labs Lab 10/14/14 2125 10/16/14 0503 10/17/14 0724  LIPASE 685* 125* 63*   No results for input(s): AMMONIA in the last 168 hours. CBC:  Recent Labs Lab 10/14/14 2125 10/14/14 2139 10/15/14 0655 10/16/14 0715 10/17/14 0724  WBC 14.2*  --  14.8* 8.0 5.4  NEUTROABS 12.6*  --   --   --   --   HGB 8.4* 10.9* 8.6* 7.7* 7.1*  HCT 27.3* 32.0* 28.9* 25.4* 23.5*  MCV 72.2*  --  73.7* 74.9* 75.3*  PLT 239  --  245 162 156   Cardiac Enzymes: No results for input(s): CKTOTAL, CKMB, CKMBINDEX, TROPONINI in the last 168 hours. BNP (last 3 results) No results for input(s): BNP in  the last 8760 hours.  ProBNP (last 3 results) No results for input(s): PROBNP in the last 8760 hours.  CBG:  Recent Labs Lab 10/17/14 0744  GLUCAP 113*    No results found for this or any previous visit (from the past 240 hour(s)).   Studies: No results found.  Scheduled Meds: . folic acid  1 mg Oral Daily  . heparin  5,000 Units Subcutaneous 3 times per day  . LORazepam  0-4 mg Oral Q12H  . multivitamin with minerals  1 tablet Oral Daily  . nicotine  21 mg Transdermal Daily  . pantoprazole (PROTONIX) IV  40 mg  Intravenous Q12H  . thiamine  100 mg Oral Daily   Or  . thiamine  100 mg Intravenous Daily   Continuous Infusions:   Principal Problem:   Acute alcoholic pancreatitis Active Problems:   Alcohol dependence   Depression   Microcytic anemia   Gastric bypass status for obesity   Elevated LFTs    Time spent: 30 minutes    [facility] State HospitalBLACK,Jennifer Toppins M  [facility] Pager [phone]. If 7PM-7AM, please contact night-coverage at [email or web address], password [email or web address] 10/17/2014, 2:23 PM  LOS: 2 days
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
## 2014-10-18 DIAGNOSIS — D509 Iron deficiency anemia, unspecified: Secondary | ICD-10-CM

## 2014-10-18 DIAGNOSIS — R7989 Other specified abnormal findings of blood chemistry: Secondary | ICD-10-CM

## 2014-10-18 DIAGNOSIS — K852 Alcohol induced acute pancreatitis: Principal | ICD-10-CM

## 2014-10-18 LAB — CBC
HCT: 26 % — ABNORMAL LOW (ref 36.0–46.0)
HEMOGLOBIN: 7.8 g/dL — AB (ref 12.0–15.0)
MCH: 22.4 pg — AB (ref 26.0–34.0)
MCHC: 30 g/dL (ref 30.0–36.0)
MCV: 74.7 fL — AB (ref 78.0–100.0)
Platelets: 187 10*3/uL (ref 150–400)
RBC: 3.48 MIL/uL — ABNORMAL LOW (ref 3.87–5.11)
RDW: 22.5 % — ABNORMAL HIGH (ref 11.5–15.5)
WBC: 5.2 10*3/uL (ref 4.0–10.5)

## 2014-10-18 MED ORDER — OXYCODONE HCL 5 MG PO TABS: 5.0000 mg | ORAL_TABLET | Freq: Four times a day (QID) | ORAL | Status: DC | PRN

## 2014-10-18 MED ORDER — ADULT MULTIVITAMIN W/MINERALS CH: 1.0000 | ORAL_TABLET | Freq: Every day | ORAL | Status: DC

## 2014-10-18 MED ORDER — NICOTINE 21 MG/24HR TD PT24: 21.0000 mg | MEDICATED_PATCH | Freq: Every day | TRANSDERMAL | Status: DC
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
## 2014-10-18 NOTE — Plan of Care (Signed)
Problem: Discharge Progression Outcomes Goal: Pt. accepted/substance abuse rehab Outcome: Not Progressing Pt. States that she is not ready to accept rehab at this point but verbally states that she needs to quit.

## 2014-10-18 NOTE — Care Management Note (Addendum)
    Page 1 of 1   10/18/2014     1:49:28 PM CARE MANAGEMENT NOTE 10/18/2014  Patient:  Jennifer [name] SeenHARVEY,Brityn L   Account Number:  [id number]  Date Initiated:  10/18/2014  Documentation initiated by:  Kathyrn SheriffHILDRESS,JESSICA  Subjective/Objective Assessment:   Pt is from home, lives with boyfriend and his mom. Pt is indepdent at baseline with no HH services or DME's. Pt is uninsured and [facility]     Action/Plan:   Pt plans to discharge home with self care. Pt referred to financial counselor and may need MATCH voucher at [employer]. Pt [facility]   Anticipated DC Date:  10/19/2014   Anticipated DC Plan:  HOME/SELF CARE  In-house referral  Financial Counselor      DC Planning Services  CM consult      Choice offered to / List presented to:             Status of service:  Completed, signed off Medicare Important Message given?   (If response is "NO", the following Medicare IM given date fields will be blank) Date Medicare IM given:   Medicare IM given by:   Date Additional Medicare IM given:   Additional Medicare IM given by:    Discharge Disposition:  HOME/SELF CARE  Per UR Regulation:  Reviewed for med. necessity/level of care/duration of stay  If discussed at Long Length of Stay Meetings, dates discussed:    Comments:  10/18/2014 1345 Kathyrn SheriffJessica Childress, RN, MSN, CM Pt discharging. [facility]u at health department. No CM needs. 10/18/2014 0900 Kathyrn SheriffJessica Childress, RN, MSN, CM

## 2014-10-18 NOTE — Progress Notes (Signed)
Patient received discharge instructions/scripts and had no further questions or concerns.  Patient in stable condition at discharge.  Patient escorted to main entrance via wheelchair by patient advocate.   

## 2014-10-18 NOTE — Care Management Utilization Note (Signed)
UR completed 

## 2015-09-01 ENCOUNTER — Inpatient Hospital Stay (HOSPITAL_COMMUNITY): Payer: Medicaid Other

## 2015-09-01 ENCOUNTER — Inpatient Hospital Stay (HOSPITAL_COMMUNITY)
Admission: EM | Admit: 2015-09-01 | Discharge: 2015-09-05 | DRG: 433 | Disposition: A | Discharge status: Home / Self Care | Payer: Medicaid Other | Attending: Internal Medicine | Admitting: Internal Medicine

## 2015-09-01 ENCOUNTER — Emergency Department (HOSPITAL_COMMUNITY): Payer: Medicaid Other

## 2015-09-01 ENCOUNTER — Encounter (HOSPITAL_COMMUNITY): Payer: Self-pay | Admitting: Emergency Medicine

## 2015-09-01 DIAGNOSIS — Z23 Encounter for immunization: Secondary | ICD-10-CM

## 2015-09-01 DIAGNOSIS — K7010 Alcoholic hepatitis without ascites: Secondary | ICD-10-CM | POA: Diagnosis present

## 2015-09-01 DIAGNOSIS — R17 Unspecified jaundice: Secondary | ICD-10-CM | POA: Diagnosis present

## 2015-09-01 DIAGNOSIS — F17200 Nicotine dependence, unspecified, uncomplicated: Secondary | ICD-10-CM | POA: Diagnosis present

## 2015-09-01 DIAGNOSIS — R740 Nonspecific elevation of levels of transaminase and lactic acid dehydrogenase [LDH]: Secondary | ICD-10-CM

## 2015-09-01 DIAGNOSIS — F10239 Alcohol dependence with withdrawal, unspecified: Secondary | ICD-10-CM | POA: Diagnosis present

## 2015-09-01 DIAGNOSIS — E860 Dehydration: Secondary | ICD-10-CM | POA: Diagnosis present

## 2015-09-01 DIAGNOSIS — F17210 Nicotine dependence, cigarettes, uncomplicated: Secondary | ICD-10-CM | POA: Diagnosis present

## 2015-09-01 DIAGNOSIS — E872 Acidosis, unspecified: Secondary | ICD-10-CM | POA: Diagnosis present

## 2015-09-01 DIAGNOSIS — E876 Hypokalemia: Secondary | ICD-10-CM | POA: Diagnosis present

## 2015-09-01 DIAGNOSIS — R7401 Elevation of levels of liver transaminase levels: Secondary | ICD-10-CM

## 2015-09-01 DIAGNOSIS — R109 Unspecified abdominal pain: Secondary | ICD-10-CM | POA: Diagnosis present

## 2015-09-01 DIAGNOSIS — Z9884 Bariatric surgery status: Secondary | ICD-10-CM

## 2015-09-01 DIAGNOSIS — F1010 Alcohol abuse, uncomplicated: Secondary | ICD-10-CM | POA: Diagnosis present

## 2015-09-01 DIAGNOSIS — K860 Alcohol-induced chronic pancreatitis: Secondary | ICD-10-CM | POA: Diagnosis present

## 2015-09-01 DIAGNOSIS — E8729 Other acidosis: Secondary | ICD-10-CM | POA: Diagnosis present

## 2015-09-01 HISTORY — DX: Alcohol abuse, uncomplicated: F10.10

## 2015-09-01 LAB — I-STAT CG4 LACTIC ACID, ED: LACTIC ACID, VENOUS: 5.16 mmol/L — AB (ref 0.5–2.0)

## 2015-09-01 LAB — COMPREHENSIVE METABOLIC PANEL
ALT: 142 U/L — AB (ref 14–54)
AST: 523 U/L — ABNORMAL HIGH (ref 15–41)
Albumin: 3 g/dL — ABNORMAL LOW (ref 3.5–5.0)
Alkaline Phosphatase: 273 U/L — ABNORMAL HIGH (ref 38–126)
Anion gap: 20 — ABNORMAL HIGH (ref 5–15)
BUN: 13 mg/dL (ref 6–20)
CHLORIDE: 100 mmol/L — AB (ref 101–111)
CO2: 18 mmol/L — ABNORMAL LOW (ref 22–32)
Calcium: 8.1 mg/dL — ABNORMAL LOW (ref 8.9–10.3)
Creatinine, Ser: 0.53 mg/dL (ref 0.44–1.00)
GFR calc Af Amer: >60 mL/min (ref >60)
GFR calc non Af Amer: >60 mL/min (ref >60)
Glucose, Bld: 64 mg/dL — ABNORMAL LOW (ref 65–99)
Potassium: 3.5 mmol/L (ref 3.5–5.1)
Sodium: 138 mmol/L (ref 135–145)
Total Bilirubin: 6.1 mg/dL — ABNORMAL HIGH (ref 0.3–1.2)
Total Protein: 6.6 g/dL (ref 6.5–8.1)

## 2015-09-01 LAB — BILIRUBIN, FRACTIONATED(TOT/DIR/INDIR)
BILIRUBIN DIRECT: 3.2 mg/dL — AB (ref 0.1–0.5)
BILIRUBIN INDIRECT: 2.5 mg/dL — AB (ref 0.3–0.9)
BILIRUBIN TOTAL: 5.7 mg/dL — AB (ref 0.3–1.2)

## 2015-09-01 LAB — URINE MICROSCOPIC-ADD ON

## 2015-09-01 LAB — CBC WITH DIFFERENTIAL/PLATELET
Basophils Absolute: 0.1 10*3/uL (ref 0.0–0.1)
Basophils Relative: 2 %
Eosinophils Absolute: 0 10*3/uL (ref 0.0–0.7)
Eosinophils Relative: 0 %
HCT: 30.5 % — ABNORMAL LOW (ref 36.0–46.0)
Hemoglobin: 10.1 g/dL — ABNORMAL LOW (ref 12.0–15.0)
LYMPHS ABS: 0.8 10*3/uL (ref 0.7–4.0)
LYMPHS PCT: 15 %
MCH: 30.9 pg (ref 26.0–34.0)
MCHC: 33.1 g/dL (ref 30.0–36.0)
MCV: 93.3 fL (ref 78.0–100.0)
MONO ABS: 0.8 10*3/uL (ref 0.1–1.0)
Monocytes Relative: 14 %
Neutro Abs: 3.7 10*3/uL (ref 1.7–7.7)
Neutrophils Relative %: 69 %
PLATELETS: 258 10*3/uL (ref 150–400)
RBC: 3.27 MIL/uL — AB (ref 3.87–5.11)
RDW: 26.1 % — AB (ref 11.5–15.5)
WBC: 5.4 10*3/uL (ref 4.0–10.5)

## 2015-09-01 LAB — URINALYSIS, ROUTINE W REFLEX MICROSCOPIC
GLUCOSE, UA: 100 mg/dL — AB
Ketones, ur: 15 mg/dL — AB
Nitrite: POSITIVE — AB
PROTEIN: 100 mg/dL — AB
Specific Gravity, Urine: >1.03 — ABNORMAL HIGH (ref 1.005–1.030)
pH: 5 (ref 5.0–8.0)

## 2015-09-01 LAB — BASIC METABOLIC PANEL
ANION GAP: 14 (ref 5–15)
BUN: 13 mg/dL (ref 6–20)
CHLORIDE: 97 mmol/L — AB (ref 101–111)
CO2: 22 mmol/L (ref 22–32)
Calcium: 7.4 mg/dL — ABNORMAL LOW (ref 8.9–10.3)
Creatinine, Ser: 0.6 mg/dL (ref 0.44–1.00)
Glucose, Bld: 112 mg/dL — ABNORMAL HIGH (ref 65–99)
POTASSIUM: 3.2 mmol/L — AB (ref 3.5–5.1)
SODIUM: 133 mmol/L — AB (ref 135–145)

## 2015-09-01 LAB — PROTIME-INR
INR: 1.11 (ref 0.00–1.49)
Prothrombin Time: 14.5 seconds (ref 11.6–15.2)

## 2015-09-01 LAB — LIPASE, BLOOD: Lipase: 65 U/L — ABNORMAL HIGH (ref 11–51)

## 2015-09-01 LAB — LACTIC ACID, PLASMA
LACTIC ACID, VENOUS: 2.7 mmol/L — AB (ref 0.5–2.0)
LACTIC ACID, VENOUS: 1.7 mmol/L (ref 0.5–2.0)

## 2015-09-01 MED ORDER — FENTANYL CITRATE (PF) 100 MCG/2ML IJ SOLN
INTRAMUSCULAR | Status: AC
  Filled 2015-09-01: qty 2

## 2015-09-01 MED ORDER — IOHEXOL 300 MG/ML  SOLN
100.0000 mL | Freq: Once | INTRAMUSCULAR | Status: AC | PRN
  Administered 2015-09-01: 100 mL via INTRAVENOUS

## 2015-09-01 MED ORDER — FOLIC ACID 1 MG PO TABS
1.0000 mg | ORAL_TABLET | Freq: Every day | ORAL | Status: DC
  Administered 2015-09-01 – 2015-09-05 (×5): 1 mg via ORAL
  Filled 2015-09-01 (×5): qty 1

## 2015-09-01 MED ORDER — SODIUM CHLORIDE 0.9 % IV BOLUS (SEPSIS)
1000.0000 mL | Freq: Once | INTRAVENOUS | Status: AC
  Administered 2015-09-01: 1000 mL via INTRAVENOUS

## 2015-09-01 MED ORDER — ACETAMINOPHEN 650 MG RE SUPP: 650.0000 mg | Freq: Four times a day (QID) | RECTAL | Status: DC | PRN

## 2015-09-01 MED ORDER — FENTANYL CITRATE (PF) 100 MCG/2ML IJ SOLN
50.0000 ug | Freq: Once | INTRAMUSCULAR | Status: AC
  Administered 2015-09-01: 50 ug via INTRAVENOUS

## 2015-09-01 MED ORDER — HYDROMORPHONE HCL 1 MG/ML IJ SOLN
0.5000 mg | INTRAMUSCULAR | Status: AC | PRN
  Administered 2015-09-01 – 2015-09-02 (×3): 0.5 mg via INTRAVENOUS
  Filled 2015-09-01 (×3): qty 1

## 2015-09-01 MED ORDER — THIAMINE HCL 100 MG/ML IJ SOLN
Freq: Once | INTRAVENOUS | Status: AC
  Administered 2015-09-01: 16:00:00 via INTRAVENOUS
  Filled 2015-09-01: qty 1000

## 2015-09-01 MED ORDER — VITAMIN B-1 100 MG PO TABS
100.0000 mg | ORAL_TABLET | Freq: Every day | ORAL | Status: DC
  Administered 2015-09-01 – 2015-09-05 (×5): 100 mg via ORAL
  Filled 2015-09-01 (×5): qty 1

## 2015-09-01 MED ORDER — LORAZEPAM 2 MG/ML IJ SOLN
1.0000 mg | Freq: Four times a day (QID) | INTRAMUSCULAR | Status: DC | PRN
  Administered 2015-09-01 – 2015-09-03 (×4): 1 mg via INTRAVENOUS
  Filled 2015-09-01 (×4): qty 1

## 2015-09-01 MED ORDER — LORAZEPAM 2 MG/ML IJ SOLN
1.0000 mg | Freq: Once | INTRAMUSCULAR | Status: AC
  Administered 2015-09-01: 1 mg via INTRAVENOUS
  Filled 2015-09-01: qty 1

## 2015-09-01 MED ORDER — ONDANSETRON HCL 4 MG/2ML IJ SOLN
4.0000 mg | Freq: Four times a day (QID) | INTRAMUSCULAR | Status: DC | PRN
  Administered 2015-09-03: 4 mg via INTRAVENOUS
  Filled 2015-09-01: qty 2

## 2015-09-01 MED ORDER — DIATRIZOATE MEGLUMINE & SODIUM 66-10 % PO SOLN
ORAL | Status: AC
  Filled 2015-09-01: qty 30

## 2015-09-01 MED ORDER — POTASSIUM CHLORIDE IN NACL 20-0.9 MEQ/L-% IV SOLN
INTRAVENOUS | Status: DC
  Administered 2015-09-01 – 2015-09-04 (×6): via INTRAVENOUS

## 2015-09-01 MED ORDER — ONDANSETRON HCL 4 MG/2ML IJ SOLN
4.0000 mg | Freq: Once | INTRAMUSCULAR | Status: AC
  Administered 2015-09-01: 4 mg via INTRAVENOUS
  Filled 2015-09-01: qty 2

## 2015-09-01 MED ORDER — ONDANSETRON HCL 4 MG PO TABS
4.0000 mg | ORAL_TABLET | Freq: Four times a day (QID) | ORAL | Status: DC | PRN
Start: 2015-09-01 — End: 2015-09-05
  Administered 2015-09-04 – 2015-09-05 (×3): 4 mg via ORAL
  Filled 2015-09-01 (×3): qty 1

## 2015-09-01 MED ORDER — THIAMINE HCL 100 MG/ML IJ SOLN: 100.0000 mg | Freq: Every day | INTRAMUSCULAR | Status: DC

## 2015-09-01 MED ORDER — PANTOPRAZOLE SODIUM 40 MG PO TBEC
40.0000 mg | DELAYED_RELEASE_TABLET | Freq: Two times a day (BID) | ORAL | Status: DC
  Administered 2015-09-01 – 2015-09-05 (×8): 40 mg via ORAL
  Filled 2015-09-01 (×8): qty 1

## 2015-09-01 MED ORDER — ONDANSETRON HCL 4 MG/2ML IJ SOLN
4.0000 mg | Freq: Once | INTRAMUSCULAR | Status: DC
  Filled 2015-09-01: qty 2

## 2015-09-01 MED ORDER — ENOXAPARIN SODIUM 40 MG/0.4ML ~~LOC~~ SOLN
40.0000 mg | SUBCUTANEOUS | Status: DC
Start: 2015-09-01 — End: 2015-09-05
  Administered 2015-09-01 – 2015-09-04 (×4): 40 mg via SUBCUTANEOUS
  Filled 2015-09-01 (×4): qty 0.4

## 2015-09-01 MED ORDER — LORAZEPAM 1 MG PO TABS
1.0000 mg | ORAL_TABLET | Freq: Four times a day (QID) | ORAL | Status: DC | PRN
  Administered 2015-09-01 – 2015-09-04 (×5): 1 mg via ORAL
  Filled 2015-09-01 (×5): qty 1

## 2015-09-01 MED ORDER — ADULT MULTIVITAMIN W/MINERALS CH
1.0000 | ORAL_TABLET | Freq: Every day | ORAL | Status: DC
  Administered 2015-09-01 – 2015-09-05 (×5): 1 via ORAL
  Filled 2015-09-01 (×5): qty 1

## 2015-09-01 MED ORDER — ACETAMINOPHEN 325 MG PO TABS
650.0000 mg | ORAL_TABLET | Freq: Four times a day (QID) | ORAL | Status: DC | PRN
  Administered 2015-09-04: 650 mg via ORAL
  Filled 2015-09-01: qty 2

## 2015-09-01 MED ORDER — NICOTINE 21 MG/24HR TD PT24
21.0000 mg | MEDICATED_PATCH | Freq: Every day | TRANSDERMAL | Status: DC
  Administered 2015-09-01 – 2015-09-04 (×4): 21 mg via TRANSDERMAL
  Filled 2015-09-01 (×5): qty 1

## 2015-09-01 MED ORDER — ALUM & MAG HYDROXIDE-SIMETH 200-200-20 MG/5ML PO SUSP: 30.0000 mL | Freq: Four times a day (QID) | ORAL | Status: DC | PRN

## 2015-09-01 NOTE — ED Notes (Signed)
Pt has been intoxicated daily since Dec 21, had tried to detox a couple of weeks ago at home. Pt c/o nausea, decreased appetite, weakness in her legs, vertigo and sinus drainage. Pt last drank yesterday, 1 pint.

## 2015-09-01 NOTE — Progress Notes (Signed)
CRITICAL VALUE ALERT  Critical value received:  Lactic acid 2.7  Date of notification:  09/01/2015  Time of notification:  1900  Critical value read back: yes  Nurse who received alert:  Simrit Gohlke  MD notified (1st page):  Dr. Adrian Blackwater  Time of first page:  1925  MD notified (2nd page):  Time of second page:  Responding MD:   Time MD responded:    Notified him that the level is actually decreasing.

## 2015-09-01 NOTE — ED Notes (Signed)
Report given to Rozann Lesches, nurse on Dept 300. All questions answered.

## 2015-09-01 NOTE — H&P (Addendum)
History and Physical  Jennifer [name] ZOX:[id number] DOB: Apr 15, 1971 DOA: 09/01/2015  Referring physician: Dr Hyacinth Meeker, ED physician PCP: [facility]   Chief Complaint: Nausea and vomiting, alcohol withdrawal  HPI: Jennifer [name] is a 45 y.o. female  History of gastric bypass, pancreatitis, alcohol abuse, tobacco abuse. Patient presents to the emergency department with nausea and dry heaving proximally 2 weeks ago. Much of the history is provided by the patient's husband and the patient. The patient has been drinking about a fifth of liquor a day since 07/05/2015. As the patient was spending most of the time intoxicated, patient's husband helps her to decrease her drinking to approximately a pint a day. Over the past 2 weeks the patient has grown increasingly nauseated with right upper quadrant and epigastric abdominal pain and vomiting. The abdominal pain is nonradiating and is rated 10 out of 10. Pain medicine improves her pain. Eating and drinking increase her nausea and vomiting. She reports no diarrhea, constipation, chest pain, shortness of breath.   Review of Systems:   She reports feeling hot and cold.  Pt denies any fevers, chills, diarrhea, constipation, abdominal pain, shortness of breath, dyspnea on exertion, orthopnea, cough, wheezing, palpitations, headache, vision changes, lightheadedness, dizziness, diarrhea, constipation, melena, rectal bleeding.  Review of systems are otherwise negative  Past Medical History  Diagnosis Date  . Gastric bypass status for obesity   . Pancreatitis   . Depression   . Alcohol abuse    Past Surgical History  Procedure Laterality Date  . Gastric bypass     Social History:  reports that she has been smoking Cigarettes.  She has been smoking about 0.50 packs per day. She does not have any smokeless tobacco history on file. She reports that she drinks alcohol. She reports that she does not use illicit drugs. Patient lives at home & is  able to participate in activities of daily living  Allergies  Allergen Reactions  . Aspirin Other (See Comments)    Cant take due to gastric bypass     Patient unaware of any family history.  Prior to Admission medications   Medication Sig Start Date End Date Taking? Authorizing Provider  Multiple Vitamin (MULTIVITAMIN WITH MINERALS) TABS tablet Take 1 tablet by mouth daily. 10/18/14  Yes Lesle Chris Black, NP  omeprazole (PRILOSEC) 20 MG capsule Take 20 mg by mouth every morning.    Yes Historical Provider, MD    Physical Exam: BP 112/71 mmHg  Pulse 76  Temp(Src) 98.7 F (37.1 C) (Temporal)  Resp 20  Ht 5\' 8"  (1.727 m)  Wt 81.647 kg (180 lb)  BMI 27.38 kg/m2  SpO2 100%  LMP 08/29/2015  General: Middle-aged Caucasian female who appears older than stated age.. Awake and alert and oriented x3. No acute cardiopulmonary distress.  Eyes: Pupils equal, round, reactive to light. Extraocular muscles are intact. Sclerae anicteric and noninjected.  ENT: Dry mucosal membranes. No mucosal lesions.   Neck: Neck supple without lymphadenopathy. No carotid bruits. No masses palpated.  Cardiovascular: Regular rate with normal S1-S2 sounds. No murmurs, rubs, gallops auscultated. No JVD.  Respiratory: Good respiratory effort with no wheezes, rales, rhonchi. Lungs clear to auscultation bilaterally.  Abdomen: Tenderness in the epigastric and right upper quadrant areas. Mild guarding. Active bowel sounds. No masses or hepatosplenomegaly  Skin: Dry, warm to touch. 2+ dorsalis pedis and radial pulses. Musculoskeletal: No calf or leg pain. All major joints not erythematous nontender.  Psychiatric: Intact judgment and insight.  Neurologic: No focal neurological deficits. Cranial nerves II through XII are grossly intact.           Labs on Admission:  Basic Metabolic Panel:  Recent Labs Lab 09/01/15 1415  NA 138  K 3.5  CL 100*  CO2 18*  GLUCOSE 64*  BUN 13  CREATININE 0.53  CALCIUM 8.1*    Liver Function Tests:  Recent Labs Lab 09/01/15 1415  AST 523*  ALT 142*  ALKPHOS 273*  BILITOT 6.1*  PROT 6.6  ALBUMIN 3.0*    Recent Labs Lab 09/01/15 1415  LIPASE 65*   No results for input(s): AMMONIA in the last 168 hours. CBC:  Recent Labs Lab 09/01/15 1248  WBC 5.4  NEUTROABS 3.7  HGB 10.1*  HCT 30.5*  MCV 93.3  PLT 258   Cardiac Enzymes: No results for input(s): CKTOTAL, CKMB, CKMBINDEX, TROPONINI in the last 168 hours.  BNP (last 3 results) No results for input(s): BNP in the last 8760 hours.  ProBNP (last 3 results) No results for input(s): PROBNP in the last 8760 hours.  CBG: No results for input(s): GLUCAP in the last 168 hours.  Radiological Exams on Admission: US Abdomen Complete  09/01/2015  CLINICAL DATA:  45 year old female with a 2 week history of abdominal pain EXAM: ABDOMEN ULTRASOUND COMPLETE COMPARISON:  Prior CT scan abdomen/ pelvis 10/14/2014 FINDINGS: Echogenic material layers within the gallbladder lumen consistent with gallbladder sludge. Per the sonographer, the sonographic Eulah Pont sign is negative. There is no gallbladder wall thickening or evidence of pericholecystic fluid. Common bile duct: Diameter: Within normal limits at 4-6 mm. Liver: No focal lesion identified. Increased echogenicity of the hepatic parenchyma with coarsening of the echotexture. The adjacent kidney is hypoechoic in comparison. Findings are consistent with steatosis. Main portal vein is patent. IVC: No abnormality visualized. Pancreas: Not well seen. Spleen: Size and appearance within normal limits. Right Kidney: Length: 10.4 cm. Echogenicity within normal limits. No mass or hydronephrosis visualized. Left Kidney: Length: 10.4 cm. Echogenicity within normal limits. No mass or hydronephrosis visualized. Abdominal aorta: No aneurysm visualized. Other findings: None. IMPRESSION: 1. Gallbladder sludge. No secondary sonographic findings of acute cholecystitis. 2. Hepatic  steatosis. Electronically Signed   By: Malachy Moan M.D.   On: 09/01/2015 16:06   Dg Chest Port 1 View  09/01/2015  CLINICAL DATA:  45 year old female with cough, congestion EXAM: PORTABLE CHEST 1 VIEW COMPARISON:  None. FINDINGS: The lungs are clear and negative for focal airspace consolidation, pulmonary edema or suspicious pulmonary nodule. No pleural effusion or pneumothorax. Cardiac and mediastinal contours are within normal limits. No acute fracture or lytic or blastic osseous lesions. The visualized upper abdominal bowel gas pattern is unremarkable. IMPRESSION: No active disease. Electronically Signed   By: Malachy Moan M.D.   On: 09/01/2015 12:45    EKG: Independently reviewed. Sinus rhythm with normal intervals. Right atrial enlargement. Flattening of the T waves in the lateral leads. No acute ST elevation or depression.   Assessment/Plan Present on Admission:  . Abdominal pain . Alcoholic hepatitis . Alcohol dependence (HCC) . Tobacco use disorder . Elevated bilirubin . Lactic acidosis . Metabolic acidosis, increased anion gap  This patient was discussed with the ED physician, including pertinent vitals, physical exam findings, labs, and imaging.  We also discussed care given by the ED provider.  #1 abdominal pain  Admit to MedSurg  There are several possible etiologies of the patient's abdominal pain: Has mildly elevated lipase, which could represent the beginnings of an  acute pancreatitis. Additionally, she could have peptic ulcer disease or inflammatory hepatitis secondary to her alcohol use. Additionally, she has a history of a Roux-en-Y gastric bypass and her abdominal pain could represent an internal hernia. Will obtain a CT with contrast of her abdomen and pelvis to rule out the internal hernia.  Abdominal ultrasound negative for acute cholecystitis  IV fluids  Recheck lipase in the morning #2 elevated bilirubin   Ordered fractionated bilirubin  Currently  undergoing ultrasound to evaluate for obstructive causes.  May be part of alcoholic hepatitis  Further workup pending the above  Repeat bilirubin in the morning #3 lactic acidosis  Repeat lactic acid now and every 3 hours 2 times #4 anion gap metabolic acidosis  Most likely secondary to alcohol use and lactic acidosis.  Possibly ketoacidotic if she has had poor by mouth intake.  We'll obtain urinalysis.  Continue IV fluids and recheck metabolic panel at 10 PM #5 alcoholic hepatitis  PT/INR ordered  Acute hepatitis panel ordered  Repeat LFTs in the morning  GI consult #6 alcohol dependence  CIWA protocol  Thiamine and folic acid daily #7  tobacco use disorder  Negative patch ordered  DVT prophylaxis: Lovenox  Consultants: GI consult  Code Status: Full code  Family Communication: Husband   Disposition Plan: Admission to MedSurg   Levie Heritage, DO [facility] Pager [phone]   Addendum: CT abdomen and pelvis with contrast was performed with impression as below:  IMPRESSION: 1. Moderate hiatal hernia containing the gastric pouch, gastrojejunostomy and proximal jejunal limb of the gastrojejunostomy. Proximal jejunal limb within the hiatal hernia is mildly distended with fluid, with no wall thickening. 2. No evidence of bowel obstruction or acute bowel inflammation. Oral contrast progresses to the distal colon. 3. Hepatomegaly with severe diffuse hepatic steatosis. Recommend correlation with clinical history to screen for alcohol abuse.  No evidence of internal hernia. Will continue plan as above.

## 2015-09-01 NOTE — ED Provider Notes (Signed)
CSN: [id number]     Arrival date & time 09/01/15  1058 History  By signing my name below, I, Bethel Born, attest that this documentation has been prepared under the direction and in the presence of Eber Hong, MD. Electronically Signed: Bethel Born, ED Scribe. 09/01/2015. 12:16 PM   Chief Complaint  Patient presents with  . Alcohol Problem   The history is provided by the patient. No language interpreter was used.   Jennifer [name] is a 45 y.o. female with history of alcohol abuse, depression, and pancreatitis who presents to the Emergency Department complaining of nausea and dry heaving with onset 2 weeks ago. The patient's husband states that she was drinking a fifth of liquor daily everyday since 07/05/15 but he helped her decrease to 1 pint per day for the last few weeks. She last used alcohol yesterday.  Associated symptoms include decreased appetite, chills, sweating, dizziness, and numbness in the legs. Also complains of sinus drainage and cough productive of clear/yellow sputum. She typically smokes 1 PPD but notes that she has recently cut back.   Past Medical History  Diagnosis Date  . Gastric bypass status for obesity   . Pancreatitis   . Depression   . Alcohol abuse    Past Surgical History  Procedure Laterality Date  . Gastric bypass     History reviewed. No pertinent family history. Social History  Substance Use Topics  . Smoking status: Current Every Day Smoker -- 0.50 packs/day    Types: Cigarettes  . Smokeless tobacco: None  . Alcohol Use: Yes     Comment: 1 pint over the past 2 weeks, 1/5 or more prior   OB History    Gravida Para Term Preterm AB TAB SAB Ectopic Multiple Living   Review of Systems  Constitutional: Positive for chills and diaphoresis.  HENT: Positive for rhinorrhea.   Respiratory: Positive for cough.   Gastrointestinal: Positive for nausea.  Neurological: Positive for dizziness and numbness.  All other systems  reviewed and are negative.   Allergies  Aspirin  Home Medications   Prior to Admission medications   Medication Sig Start Date End Date Taking? Authorizing Provider  Multiple Vitamin (MULTIVITAMIN WITH MINERALS) TABS tablet Take 1 tablet by mouth daily. 10/18/14  Yes Lesle Chris Black, NP  omeprazole (PRILOSEC) 20 MG capsule Take 20 mg by mouth every morning.    Yes Historical Provider, MD  nicotine (NICODERM CQ - DOSED IN MG/24 HOURS) 21 mg/24hr patch Place 1 patch (21 mg total) onto the skin daily. Patient not taking: Reported on 09/01/2015 10/18/14   Gwenyth Bender, NP  oxyCODONE (OXY IR/ROXICODONE) 5 MG immediate release tablet Take 1 tablet (5 mg total) by mouth every 6 (six) hours as needed for severe pain. Patient not taking: Reported on 09/01/2015 10/18/14   Gwenyth Bender, NP   BP 124/83 mmHg  Pulse 121  Temp(Src) 98.7 F (37.1 C) (Temporal)  Resp 18  Ht  (1.727 m)  Wt 180 lb (81.647 kg)  BMI 27.38 kg/m2  SpO2 100%  LMP 08/29/2015 Physical Exam  Constitutional: She appears well-developed and well-nourished. She appears ill. No distress.  Ill appearing, dry heaving during exam.   HENT:  Head: Normocephalic and atraumatic.  Mouth/Throat: Oropharynx is clear and moist. No oropharyngeal exudate.  Eyes: EOM are normal. Pupils are equal, round, and reactive to light. Right eye exhibits no discharge. Left eye  exhibits no discharge. No scleral icterus.  Pale conjunctiva   Neck: Normal range of motion. Neck supple. No JVD present. No thyromegaly present.  Cardiovascular: Regular rhythm, normal heart sounds and intact distal pulses.  Tachycardia present.  Exam reveals no gallop and no friction rub.   No murmur heard. Pulmonary/Chest: Effort normal and breath sounds normal. No respiratory distress. She has no wheezes. She has no rales.  Abdominal: Soft. Bowel sounds are normal. She exhibits no distension and no mass. There is tenderness in the right upper quadrant, epigastric area and  left upper quadrant.  Musculoskeletal: Normal range of motion. She exhibits no edema or tenderness.  Lymphadenopathy:    She has no cervical adenopathy.  Neurological: She is alert. Coordination normal.  Decreased sensation to pinprick on left. Pinprick normal on right. Decreased sensation to light touch from the knees down.   Skin: Skin is warm and dry. No rash noted. No erythema.  Psychiatric: She has a normal mood and affect. Her behavior is normal.  Nursing note and vitals reviewed.   ED Course  Procedures (including critical care time) DIAGNOSTIC STUDIES: Oxygen Saturation is 100% on RA,  normal by my interpretation.    COORDINATION OF CARE: 11:37 AM Discussed treatment plan which includes lab work, CXR, EKG, Ativan, Zofran, and IVF with pt at bedside and pt agreed to plan.  Labs Review Labs Reviewed  CBC WITH DIFFERENTIAL/PLATELET - Abnormal; Notable for the following:    RBC 3.27 (*)    Hemoglobin 10.1 (*)    HCT 30.5 (*)    RDW 26.1 (*)    All other components within normal limits  COMPREHENSIVE METABOLIC PANEL - Abnormal; Notable for the following:    Chloride 100 (*)    CO2 18 (*)    Glucose, Bld 64 (*)    Calcium 8.1 (*)    Albumin 3.0 (*)    AST 523 (*)    ALT 142 (*)    Alkaline Phosphatase 273 (*)    Total Bilirubin 6.1 (*)    Anion gap 20 (*)    All other components within normal limits  LIPASE, BLOOD - Abnormal; Notable for the following:    Lipase 65 (*)    All other components within normal limits  I-STAT CG4 LACTIC ACID, ED - Abnormal; Notable for the following:    Lactic Acid, Venous 5.16 (*)    All other components within normal limits  I-STAT CG4 LACTIC ACID, ED    Imaging Review Dg Chest Port 1 View  09/01/2015  CLINICAL DATA:  45 year old female with cough, congestion EXAM: PORTABLE CHEST 1 VIEW COMPARISON:  None. FINDINGS: The lungs are clear and negative for focal airspace consolidation, pulmonary edema or suspicious pulmonary nodule. No  pleural effusion or pneumothorax. Cardiac and mediastinal contours are within normal limits. No acute fracture or lytic or blastic osseous lesions. The visualized upper abdominal bowel gas pattern is unremarkable. IMPRESSION: No active disease. Electronically Signed   By: Malachy Moan M.D.   On: 09/01/2015 12:45    I personally reviewed and evaluated these images and lab results as a part of my medical decision-making.   EKG Interpretation   Date/Time:  Friday September 01 2015 11:31:40 EST Ventricular Rate:  97 PR Interval:  146 QRS Duration: 95 QT Interval:  342 QTC Calculation: 434 R Axis:   89 Text Interpretation:  Sinus rhythm Right atrial enlargement Nonspecific T  abnormalities, anterior leads Baseline wander in lead(s) V2 V4 Since last  tracing  rate faster Nonspecific T wave abnormality Abnormal ekg Confirmed  by Shrihan Putt  MD, Rosaland Shiffman ([id number]) on 09/01/2015 12:40:31 PM      MDM   Final diagnoses:  Transaminitis  Lactic acidosis  Alcohol-induced chronic pancreatitis (HCC)    I personally performed the services described in this documentation, which was scribed in my presence. The recorded information has been reviewed and is accurate.    Pt has severe abd pain Tachycardia Improved with Ativan Fluids given Banana bag for thiamine etc BP normal -  Needs admission with significant abd labs including LFT's and bilirubin Pt informed  D/w Dr. Adrian Blackwater who will admit Lactic Acidosis with anion gap -  Metabolic anion gap Critically ill.  CRITICAL CARE Performed by: Vida Roller Total critical care time: 35 minutes Critical care time was exclusive of separately billable procedures and treating other patients. Critical care was necessary to treat or prevent imminent or life-threatening deterioration. Critical care was time spent personally by me on the following activities: development of treatment plan with patient and/or surrogate as well as nursing, discussions with  consultants, evaluation of patient's response to treatment, examination of patient, obtaining history from patient or surrogate, ordering and performing treatments and interventions, ordering and review of laboratory studies, ordering and review of radiographic studies, pulse oximetry and re-evaluation of patient's condition.  Meds given in ED:  Medications  ondansetron (ZOFRAN) injection 4 mg (not administered)  sodium chloride 0.9 % 1,000 mL with thiamine 100 mg, folic acid 1 mg, multivitamins adult 10 mL infusion (not administered)  LORazepam (ATIVAN) injection 1 mg (1 mg Intravenous Given 09/01/15 1347)  sodium chloride 0.9 % bolus 1,000 mL (0 mLs Intravenous Stopped 09/01/15 1510)  ondansetron (ZOFRAN) injection 4 mg (4 mg Intravenous Given 09/01/15 1347)  fentaNYL (SUBLIMAZE) injection 50 mcg (50 mcg Intravenous Given 09/01/15 1405)      Eber Hong, MD 09/01/15 1523

## 2015-09-02 DIAGNOSIS — E872 Acidosis: Secondary | ICD-10-CM

## 2015-09-02 DIAGNOSIS — K7010 Alcoholic hepatitis without ascites: Principal | ICD-10-CM

## 2015-09-02 DIAGNOSIS — R1013 Epigastric pain: Secondary | ICD-10-CM

## 2015-09-02 LAB — PROTIME-INR
INR: 1.16 (ref 0.00–1.49)
Prothrombin Time: 15 seconds (ref 11.6–15.2)

## 2015-09-02 LAB — COMPREHENSIVE METABOLIC PANEL
ALBUMIN: 2.3 g/dL — AB (ref 3.5–5.0)
ALK PHOS: 212 U/L — AB (ref 38–126)
ALT: 107 U/L — AB (ref 14–54)
ANION GAP: 9 (ref 5–15)
AST: 369 U/L — ABNORMAL HIGH (ref 15–41)
BILIRUBIN TOTAL: 7.6 mg/dL — AB (ref 0.3–1.2)
BUN: 12 mg/dL (ref 6–20)
CALCIUM: 7.2 mg/dL — AB (ref 8.9–10.3)
CO2: 24 mmol/L (ref 22–32)
CREATININE: 0.49 mg/dL (ref 0.44–1.00)
Chloride: 100 mmol/L — ABNORMAL LOW (ref 101–111)
GFR calc Af Amer: >60 mL/min (ref >60)
GFR calc non Af Amer: >60 mL/min (ref >60)
GLUCOSE: 76 mg/dL (ref 65–99)
Potassium: 3.8 mmol/L (ref 3.5–5.1)
SODIUM: 133 mmol/L — AB (ref 135–145)
TOTAL PROTEIN: 5.1 g/dL — AB (ref 6.5–8.1)

## 2015-09-02 LAB — HEPATITIS PANEL, ACUTE
HCV Ab: <0.1 s/co ratio (ref 0.0–0.9)
Hep A IgM: NEGATIVE
Hep B C IgM: NEGATIVE
Hepatitis B Surface Ag: NEGATIVE

## 2015-09-02 LAB — LIPASE, BLOOD: Lipase: 53 U/L — ABNORMAL HIGH (ref 11–51)

## 2015-09-02 MED ORDER — OXYCODONE HCL 5 MG PO TABS
5.0000 mg | ORAL_TABLET | Freq: Four times a day (QID) | ORAL | Status: DC | PRN
  Administered 2015-09-02 – 2015-09-05 (×9): 5 mg via ORAL
  Filled 2015-09-02 (×9): qty 1

## 2015-09-02 MED ORDER — PRO-STAT SUGAR FREE PO LIQD
30.0000 mL | Freq: Three times a day (TID) | ORAL | Status: DC
  Administered 2015-09-02 – 2015-09-05 (×10): 30 mL via ORAL
  Filled 2015-09-02 (×9): qty 30

## 2015-09-02 MED ORDER — MORPHINE SULFATE (PF) 2 MG/ML IV SOLN
2.0000 mg | INTRAVENOUS | Status: DC | PRN
  Administered 2015-09-02 – 2015-09-03 (×4): 2 mg via INTRAVENOUS
  Filled 2015-09-02 (×4): qty 1

## 2015-09-02 NOTE — Progress Notes (Signed)
PT asleep during CIWA assessment.

## 2015-09-02 NOTE — Progress Notes (Signed)
[facility] PROGRESS NOTE  Jennifer [name] ZOX:[id number] DOB: 05-Sep-1970 DOA: 09/01/2015 PCP: [facility]  Assessment/Plan: 1. Abdminal Pain -Jennifer [name] having a history of alcohol abuse presenting with complaints of abdominal pain associated with nausea and vomiting. -Possibilities include alcohol-induced pancreatitis, alcohol induced hepatitis or even gastritis.  -CT scan of abdomen and pelvis showing changes consistent with history of gastric bypass surgery. Lipase was 53 on a.m. lab work -Plan to continue supportive care, IV fluid resuscitation, as needed IV antiemetic therapy, as needed narcotic analgesia.   2.  Alcohol induced hepatitis. -She has been drinking approximately a fifth of liquor daily for the past several months  -Initial labs showing AST of 523 with ALT of 142. AST to ALT ratio highly consistent with alcohol hepatitis. -Continue IV fluid resuscitation, supportive care  3.  Alcohol abuse. -She was extensively counseled, explained liver findings on CT scan. Also explained that this was causing to other organs. -She is high risk for withdrawal, continue IV Ativan. On the CIWA protocol.   4.  Hypokalemia  -Lab showing potassium of 3.2 likely secondary to GI losses  -Replaced   5.  Lactic acidosis  -Likely related to profound dehydration, lab showing downward trend in lactic acid with IV fluid resuscitation   6.  Metabolic Ketoacidosis -Likely secondary to starvation ketosis in setting of alcohol abuse -Initial lab work showing an anion gap of 20 with bicarbonate of 18.  -Repeat lab work on 09/02/2015 showing) anion gap 29  Code Status: full code Family Communication:  Disposition Plan: continue supportive care   HPI/Subjective: Jennifer [name] is a 45 year old female with a past medical history of alcohol abuse, pancreatitis, admitted to medicine service on 09/01/2015 when she presented with complaints of nausea, vomiting, abdominal pain. She  reported drinking approximate fifth of liquor daily since December 2016. She reported developing multiple episodes of nausea and vomiting associate with abdominal pain over the past several weeks. She has had little PO intake. Labs reveal elevated transaminases.   Objective: Filed Vitals:   09/01/15 2140 09/02/15 0545  BP: 120/71 113/59  Pulse: 82 86  Temp: 98 F (36.7 C) 98.2 F (36.8 C)  Resp: 20 20    Intake/Output Summary (Last 24 hours) at 09/02/15 1053 Last data filed at 09/02/15 0830  Gross per 24 hour  Intake      0 ml  Output    200 ml  Net   -200 ml   Filed Weights   09/01/15 1105  Weight: 81.647 kg (180 lb)    Exam:   General:  ill-appearing, although no acute distress. Complains of abdominal pain   Cardiovascular: regular rate and rhythm normal S1-S2 no murmurs or gallops   Respiratory: normal respiratory effort   Abdomen: she has pain to palpation across generalized abdomen worse over epigastric region   Musculoskeletal:  no edema   Data Reviewed: Basic Metabolic Panel:  Recent Labs Lab 09/01/15 1415 09/01/15 1949 09/02/15 0710  NA 138 133* 133*  K 3.5 3.2* 3.8  CL 100* 97* 100*  CO2 18* 22 24  GLUCOSE 64* 112* 76  BUN CREATININE 0.53 0.60 0.49  CALCIUM 8.1* 7.4* 7.2*   Liver Function Tests:  Recent Labs Lab 09/01/15 1415 09/01/15 1607 09/02/15 0710  AST 523*  --  369*  ALT 142*  --  107*  ALKPHOS 273*  --  212*  BILITOT 6.1* 5.7* 7.6*  PROT 6.6  --  5.1*  ALBUMIN 3.0*  --  2.3*    Recent Labs Lab 09/01/15 1415 09/02/15 0710  LIPASE 65* 53*   No results for input(s): AMMONIA in the last 168 hours. CBC:  Recent Labs Lab 09/01/15 1248  WBC 5.4  NEUTROABS 3.7  HGB 10.1*  HCT 30.5*  MCV 93.3  PLT 258   Cardiac Enzymes: No results for input(s): CKTOTAL, CKMB, CKMBINDEX, TROPONINI in the last 168 hours. BNP (last 3 results) No results for input(s): BNP in the last 8760 hours.  ProBNP (last 3 results) No  results for input(s): PROBNP in the last 8760 hours.  CBG: No results for input(s): GLUCAP in the last 168 hours.  No results found for this or any previous visit (from the past 240 hour(s)).   Studies: US Abdomen Complete  09/01/2015  CLINICAL DATA:  45 year old female with a 2 week history of abdominal pain EXAM: ABDOMEN ULTRASOUND COMPLETE COMPARISON:  Prior CT scan abdomen/ pelvis 10/14/2014 FINDINGS: Echogenic material layers within the gallbladder lumen consistent with gallbladder sludge. Per the sonographer, the sonographic Eulah Pont sign is negative. There is no gallbladder wall thickening or evidence of pericholecystic fluid. Common bile duct: Diameter: Within normal limits at 4-6 mm. Liver: No focal lesion identified. Increased echogenicity of the hepatic parenchyma with coarsening of the echotexture. The adjacent kidney is hypoechoic in comparison. Findings are consistent with steatosis. Main portal vein is patent. IVC: No abnormality visualized. Pancreas: Not well seen. Spleen: Size and appearance within normal limits. Right Kidney: Length: 10.4 cm. Echogenicity within normal limits. No mass or hydronephrosis visualized. Left Kidney: Length: 10.4 cm. Echogenicity within normal limits. No mass or hydronephrosis visualized. Abdominal aorta: No aneurysm visualized. Other findings: None. IMPRESSION: 1. Gallbladder sludge. No secondary sonographic findings of acute cholecystitis. 2. Hepatic steatosis. Electronically Signed   By: Malachy Moan M.D.   On: 09/01/2015 16:06   Ct Abdomen Pelvis W Contrast  09/01/2015  CLINICAL DATA:  Epigastric abdominal pain. History of gastric bypass surgery. EXAM: CT ABDOMEN AND PELVIS WITH CONTRAST TECHNIQUE: Multidetector CT imaging of the abdomen and pelvis was performed using the standard protocol following bolus administration of intravenous contrast. CONTRAST:  OMNIPAQUE IOHEXOL 300 MG/ML  SOLN COMPARISON:  10/14/2014 CT abdomen/ pelvis. Abdominal  sonogram from earlier today. FINDINGS: Lower chest: No significant pulmonary nodules or acute consolidative airspace disease. Hepatobiliary: Hepatomegaly. Severe diffuse hepatic steatosis. No liver mass. No definite liver surface irregularity. Layering sludge in the gallbladder with no radiopaque cholelithiasis and no definite gallbladder wall thickening. No pericholecystic fluid. No biliary ductal dilatation. Pancreas: Normal, with no mass or duct dilation. Spleen: Normal size. No mass. Adrenals/Urinary Tract: Normal adrenals. Normal kidneys with no hydronephrosis and no renal mass. Normal bladder. Stomach/Bowel: The patient is status post gastric bypass surgery. There is a moderate hiatal hernia, which contains a proximal gastric pouch, intact appearing gastrojejunostomy and proximal jejunal limb of the gastrojejunostomy, which is mildly distended with fluid, with no wall thickening. The excluded distal stomach is relatively collapsed and fluid-filled with no gastric wall thickening. Otherwise no small bowel wall thickening or small bowel dilatation. Normal appendix. Oral contrast progresses to the distal rectum. Normal large bowel with no diverticulosis, large bowel wall thickening or pericolonic fat stranding. Vascular/Lymphatic: Normal caliber abdominal aorta. Patent portal, splenic, hepatic and renal veins. No pathologically enlarged lymph nodes in the abdomen or pelvis. Reproductive: Grossly normal uterus.  No adnexal mass. Other: No pneumoperitoneum, ascites or focal fluid collection. Musculoskeletal: No aggressive appearing focal osseous lesions. Moderate degenerative changes in the visualized thoracolumbar  spine. IMPRESSION: 1. Moderate hiatal hernia containing the gastric pouch, gastrojejunostomy and proximal jejunal limb of the gastrojejunostomy. Proximal jejunal limb within the hiatal hernia is mildly distended with fluid, with no wall thickening. 2. No evidence of bowel obstruction or acute bowel  inflammation. Oral contrast progresses to the distal colon. 3. Hepatomegaly with severe diffuse hepatic steatosis. Recommend correlation with clinical history to screen for alcohol abuse. Electronically Signed   By: Delbert Phenix M.D.   On: 09/01/2015 19:47   Dg Chest Port 1 View  09/01/2015  CLINICAL DATA:  45 year old female with cough, congestion EXAM: PORTABLE CHEST 1 VIEW COMPARISON:  None. FINDINGS: The lungs are clear and negative for focal airspace consolidation, pulmonary edema or suspicious pulmonary nodule. No pleural effusion or pneumothorax. Cardiac and mediastinal contours are within normal limits. No acute fracture or lytic or blastic osseous lesions. The visualized upper abdominal bowel gas pattern is unremarkable. IMPRESSION: No active disease. Electronically Signed   By: Malachy Moan M.D.   On: 09/01/2015 12:45    Scheduled Meds: . enoxaparin (LOVENOX) injection  40 mg Subcutaneous Q24H  . feeding supplement (PRO-STAT SUGAR FREE 64)  30 mL Oral TID  . folic acid  1 mg Oral Daily  . multivitamin with minerals  1 tablet Oral Daily  . nicotine  21 mg Transdermal Daily  . pantoprazole  40 mg Oral BID  . thiamine  100 mg Oral Daily   Or  . thiamine  100 mg Intravenous Daily   Continuous Infusions: . 0.9 % NaCl with KCl 20 mEq / L 125 mL/hr at 09/02/15 1610    Active Problems:   Alcohol dependence (HCC)   Tobacco use disorder   Abdominal pain   Alcoholic hepatitis   Elevated bilirubin   Lactic acidosis   Metabolic acidosis, increased anion gap    Time spent: 35 min    Jeralyn Bennett  [facility] Pager [phone]. If 7PM-7AM, please contact night-coverage at [email or web address], password [facility] 09/02/2015, 10:53 AM  LOS: 1 day

## 2015-09-02 NOTE — Progress Notes (Signed)
Initial Nutrition Assessment  DOCUMENTATION CODES:  Not applicable  INTERVENTION:  Recommend supplementation of RYGB mvi/mins: 2x multivitamins, 1000  Vit B12, 2000 iu Vit D3, Iron 18 mg (in additional to iron in mvi with min) and calcium citrate 1000-1500 mg/day  Additional Thiamin/Folate may be warranted  30 mls prostat TID. Each 30 mls provides 100 kcal, 15 g Protein  NUTRITION DIAGNOSIS:  Increased nutrient needs related to History of RYGB and recent severe ETOH abuse as evidenced by estimated nutrition requirements for these conditions  GOAL:  Patient will meet greater than or equal to 90% of their needs  MONITOR:  PO intake, Supplement acceptance, Labs, Workup  REASON FOR ASSESSMENT:  Malnutrition Screening Tool    ASSESSMENT:  45 y/o female PMHx gastric bypass, pancreatitis, etoh abuse, tobacco abuse presents with dry heaving starting 2 weeks ago. Pt had been consuming 5th alcohol per day since 07/05/15. Pt's husband tried to wean to pint today and pt started to suffer nausea/vomiting/abdominal pain that worsens with oral intake.   RD operating remotely. Pt has history of gastric bypass which already puts her at risk of vitamin/mineral deficiency. Unable to determine when pt had procedure done and if pt had been taking her vitamin/mineral supplements that are supposed to be taken life long. Also in the setting extreme alcohol abuse, pt likely has minimal mineral/vitamin absorption.   Ct of abdomen showed hiatal hernia and severe hepatic steatosis. Will follow additional workup/treamtent planning  Pt would likely meet criteria for malnutrition in acute illness though unable to determine recent weight trends (pt says she does not know if she has lost weight) or quantify her oral intake.   NFPE: Unable to assess. Will look for any signs of vitamin/mineral deficiency on follow up  Labs reviewed: Hyponatremia, hypocalcemia, elevated liver enzymes, hypoalbuminemia, elevated  bilirubin, elevated lipase   Diet Order:  Diet regular Room service appropriate?: Yes; Fluid consistency:: Thin  Skin:  Reviewed, no issues  Last BM:  2/17  Height:  Ht Readings from Last 1 Encounters:  09/01/15  (1.727 m)   Weight:  Wt Readings from Last 1 Encounters:  09/01/15 180 lb (81.647 kg)   Wt Readings from Last 10 Encounters:  09/01/15 180 lb (81.647 kg)  10/15/14 169 lb 15.6 oz (77.1 kg)  01/23/14 157 lb 4.8 oz (71.351 kg)  05/05/13 171 lb (77.565 kg)  05/02/13 170 lb (77.111 kg)  04/20/13 180 lb (81.647 kg)  03/10/13 162 lb (73.483 kg)   Ideal Body Weight:  63.64 kg  BMI:  Body mass index is 27.38 kg/(m^2).  Estimated Nutritional Needs:  Kcal:  1800-2000 kcals (22-24 kcal/kg bw) Protein:  89-102 g pro (1.4-1.6 g/kg bw) Fluid:  1.8-2 liters fluid  EDUCATION NEEDS:  No education needs identified at this time  Christophe Louis RD, LDN Nutrition Pager: [phone] 09/02/2015 10:30 AM

## 2015-09-03 DIAGNOSIS — F10230 Alcohol dependence with withdrawal, uncomplicated: Secondary | ICD-10-CM

## 2015-09-03 DIAGNOSIS — R17 Unspecified jaundice: Secondary | ICD-10-CM

## 2015-09-03 LAB — COMPREHENSIVE METABOLIC PANEL
ALBUMIN: 2.1 g/dL — AB (ref 3.5–5.0)
ALK PHOS: 201 U/L — AB (ref 38–126)
ALT: 89 U/L — ABNORMAL HIGH (ref 14–54)
ANION GAP: 8 (ref 5–15)
AST: 216 U/L — AB (ref 15–41)
BILIRUBIN TOTAL: 5.4 mg/dL — AB (ref 0.3–1.2)
BUN: 12 mg/dL (ref 6–20)
CALCIUM: 7.3 mg/dL — AB (ref 8.9–10.3)
CO2: 20 mmol/L — ABNORMAL LOW (ref 22–32)
CREATININE: 0.48 mg/dL (ref 0.44–1.00)
Chloride: 106 mmol/L (ref 101–111)
GFR calc Af Amer: >60 mL/min (ref >60)
GFR calc non Af Amer: >60 mL/min (ref >60)
GLUCOSE: 107 mg/dL — AB (ref 65–99)
Potassium: 3.7 mmol/L (ref 3.5–5.1)
Sodium: 134 mmol/L — ABNORMAL LOW (ref 135–145)
TOTAL PROTEIN: 4.9 g/dL — AB (ref 6.5–8.1)

## 2015-09-03 NOTE — Progress Notes (Signed)
[facility] PROGRESS NOTE  Jennifer [name] HQI:[id number] DOB: 24-Jan-1971 DOA: 09/01/2015 PCP: [facility]  Assessment/Plan: 1. Abdminal Pain -Jennifer [name] having a history of alcohol abuse presenting with complaints of abdominal pain associated with nausea and vomiting. -Possibilities include alcohol-induced pancreatitis, alcohol induced hepatitis or even gastritis.  -CT scan of abdomen and pelvis showing changes consistent with history of gastric bypass surgery. Lipase was 53 on a.m. lab work -Plan to continue supportive care, IV fluid resuscitation, as needed IV antiemetic therapy, as needed narcotic analgesia.   2.  Alcohol induced hepatitis. -She has been drinking approximately a fifth of liquor daily for the past several months  -Initial labs showing AST of 523 with ALT of 142. AST to ALT ratio highly consistent with alcohol hepatitis. -Repeat lab work on 09/02/2000 showed improvement to liver function tests with AST of 216, ALT of 89, total bilirubin of 5.4. -Continue IV fluids, supportive care  3.  Alcohol abuse. -She was extensively counseled, explained liver findings on CT scan. Also explained that this was causing to other organs. -She is high risk for withdrawal, continue IV Ativan. On the CIWA protocol.   4.  Hypokalemia  -Lab showing potassium of 3.2 likely secondary to GI losses  -Replaced   5.  Lactic acidosis  -Likely related to profound dehydration, lab showing downward trend in lactic acid with IV fluid resuscitation   6.  Metabolic Ketoacidosis -Likely secondary to starvation ketosis in setting of alcohol abuse -Initial lab work showing an anion gap of 20 with bicarbonate of 18.  -Repeat lab work on 09/02/2015 showing) anion gap 29  Code Status: full code Family Communication:  Disposition Plan: continue supportive care   HPI/Subjective: Jennifer [name] is a 45 year old female with a past medical history of alcohol abuse, pancreatitis, admitted to  medicine service on 09/01/2015 when she presented with complaints of nausea, vomiting, abdominal pain. She reported drinking approximate fifth of liquor daily since December 2016. She reported developing multiple episodes of nausea and vomiting associate with abdominal pain over the past several weeks. She has had little PO intake. Labs reveal elevated transaminases.   Objective: Filed Vitals:   09/02/15 2128 09/03/15 0513  BP: 120/76 99/55  Pulse: 84 94  Temp: 98.1 F (36.7 C) 98.2 F (36.8 C)  Resp: 20 20    Intake/Output Summary (Last 24 hours) at 09/03/15 1107 Last data filed at 09/03/15 1030  Gross per 24 hour  Intake    480 ml  Output    150 ml  Net    330 ml   Filed Weights   09/01/15 1105  Weight: 81.647 kg (180 lb)    Exam:   General:  ill-appearing, appears mildly tremorous  Cardiovascular: regular rate and rhythm normal S1-S2 no murmurs or gallops   Respiratory: normal respiratory effort   Abdomen: Abdomen doesn't appear as tender to palpation as was yesterday, no rebound tenderness or guarding  Musculoskeletal:  no edema   Data Reviewed: Basic Metabolic Panel:  Recent Labs Lab 09/01/15 1415 09/01/15 1949 09/02/15 0710 09/03/15 0724  NA 138 133* 133* 134*  K 3.5 3.2* 3.8 3.7  CL 100* 97* 100* 106  CO2 18* 22 24 20*  GLUCOSE 64* 112* 76 107*  BUN CREATININE 0.53 0.60 0.49 0.48  CALCIUM 8.1* 7.4* 7.2* 7.3*   Liver Function Tests:  Recent Labs Lab 09/01/15 1415 09/01/15 1607 09/02/15 0710 09/03/15 0724  AST 523*  --  369* 216*  ALT 142*  --  107* 89*  ALKPHOS 273*  --  212* 201*  BILITOT 6.1* 5.7* 7.6* 5.4*  PROT 6.6  --  5.1* 4.9*  ALBUMIN 3.0*  --  2.3* 2.1*    Recent Labs Lab 09/01/15 1415 09/02/15 0710  LIPASE 65* 53*   No results for input(s): AMMONIA in the last 168 hours. CBC:  Recent Labs Lab 09/01/15 1248  WBC 5.4  NEUTROABS 3.7  HGB 10.1*  HCT 30.5*  MCV 93.3  PLT 258   Cardiac Enzymes: No  results for input(s): CKTOTAL, CKMB, CKMBINDEX, TROPONINI in the last 168 hours. BNP (last 3 results) No results for input(s): BNP in the last 8760 hours.  ProBNP (last 3 results) No results for input(s): PROBNP in the last 8760 hours.  CBG: No results for input(s): GLUCAP in the last 168 hours.  No results found for this or any previous visit (from the past 240 hour(s)).   Studies: US Abdomen Complete  09/01/2015  CLINICAL DATA:  45 year old female with a 2 week history of abdominal pain EXAM: ABDOMEN ULTRASOUND COMPLETE COMPARISON:  Prior CT scan abdomen/ pelvis 10/14/2014 FINDINGS: Echogenic material layers within the gallbladder lumen consistent with gallbladder sludge. Per the sonographer, the sonographic Eulah Pont sign is negative. There is no gallbladder wall thickening or evidence of pericholecystic fluid. Common bile duct: Diameter: Within normal limits at 4-6 mm. Liver: No focal lesion identified. Increased echogenicity of the hepatic parenchyma with coarsening of the echotexture. The adjacent kidney is hypoechoic in comparison. Findings are consistent with steatosis. Main portal vein is patent. IVC: No abnormality visualized. Pancreas: Not well seen. Spleen: Size and appearance within normal limits. Right Kidney: Length: 10.4 cm. Echogenicity within normal limits. No mass or hydronephrosis visualized. Left Kidney: Length: 10.4 cm. Echogenicity within normal limits. No mass or hydronephrosis visualized. Abdominal aorta: No aneurysm visualized. Other findings: None. IMPRESSION: 1. Gallbladder sludge. No secondary sonographic findings of acute cholecystitis. 2. Hepatic steatosis. Electronically Signed   By: Malachy Moan M.D.   On: 09/01/2015 16:06   Ct Abdomen Pelvis W Contrast  09/01/2015  CLINICAL DATA:  Epigastric abdominal pain. History of gastric bypass surgery. EXAM: CT ABDOMEN AND PELVIS WITH CONTRAST TECHNIQUE: Multidetector CT imaging of the abdomen and pelvis was performed using  the standard protocol following bolus administration of intravenous contrast. CONTRAST:  OMNIPAQUE IOHEXOL 300 MG/ML  SOLN COMPARISON:  10/14/2014 CT abdomen/ pelvis. Abdominal sonogram from earlier today. FINDINGS: Lower chest: No significant pulmonary nodules or acute consolidative airspace disease. Hepatobiliary: Hepatomegaly. Severe diffuse hepatic steatosis. No liver mass. No definite liver surface irregularity. Layering sludge in the gallbladder with no radiopaque cholelithiasis and no definite gallbladder wall thickening. No pericholecystic fluid. No biliary ductal dilatation. Pancreas: Normal, with no mass or duct dilation. Spleen: Normal size. No mass. Adrenals/Urinary Tract: Normal adrenals. Normal kidneys with no hydronephrosis and no renal mass. Normal bladder. Stomach/Bowel: The patient is status post gastric bypass surgery. There is a moderate hiatal hernia, which contains a proximal gastric pouch, intact appearing gastrojejunostomy and proximal jejunal limb of the gastrojejunostomy, which is mildly distended with fluid, with no wall thickening. The excluded distal stomach is relatively collapsed and fluid-filled with no gastric wall thickening. Otherwise no small bowel wall thickening or small bowel dilatation. Normal appendix. Oral contrast progresses to the distal rectum. Normal large bowel with no diverticulosis, large bowel wall thickening or pericolonic fat stranding. Vascular/Lymphatic: Normal caliber abdominal aorta. Patent portal, splenic, hepatic and renal veins. No pathologically enlarged lymph  nodes in the abdomen or pelvis. Reproductive: Grossly normal uterus.  No adnexal mass. Other: No pneumoperitoneum, ascites or focal fluid collection. Musculoskeletal: No aggressive appearing focal osseous lesions. Moderate degenerative changes in the visualized thoracolumbar spine. IMPRESSION: 1. Moderate hiatal hernia containing the gastric pouch, gastrojejunostomy and proximal jejunal limb of  the gastrojejunostomy. Proximal jejunal limb within the hiatal hernia is mildly distended with fluid, with no wall thickening. 2. No evidence of bowel obstruction or acute bowel inflammation. Oral contrast progresses to the distal colon. 3. Hepatomegaly with severe diffuse hepatic steatosis. Recommend correlation with clinical history to screen for alcohol abuse. Electronically Signed   By: Delbert Phenix M.D.   On: 09/01/2015 19:47   Dg Chest Port 1 View  09/01/2015  CLINICAL DATA:  45 year old female with cough, congestion EXAM: PORTABLE CHEST 1 VIEW COMPARISON:  None. FINDINGS: The lungs are clear and negative for focal airspace consolidation, pulmonary edema or suspicious pulmonary nodule. No pleural effusion or pneumothorax. Cardiac and mediastinal contours are within normal limits. No acute fracture or lytic or blastic osseous lesions. The visualized upper abdominal bowel gas pattern is unremarkable. IMPRESSION: No active disease. Electronically Signed   By: Malachy Moan M.D.   On: 09/01/2015 12:45    Scheduled Meds: . enoxaparin (LOVENOX) injection  40 mg Subcutaneous Q24H  . feeding supplement (PRO-STAT SUGAR FREE 64)  30 mL Oral TID  . folic acid  1 mg Oral Daily  . multivitamin with minerals  1 tablet Oral Daily  . nicotine  21 mg Transdermal Daily  . pantoprazole  40 mg Oral BID  . thiamine  100 mg Oral Daily   Or  . thiamine  100 mg Intravenous Daily   Continuous Infusions: . 0.9 % NaCl with KCl 20 mEq / L 125 mL/hr at 09/03/15 1610    Active Problems:   Alcohol dependence (HCC)   Tobacco use disorder   Abdominal pain   Alcoholic hepatitis   Elevated bilirubin   Lactic acidosis   Metabolic acidosis, increased anion gap    Time spent: 25 min    Jeralyn Bennett  [facility] Pager [phone]. If 7PM-7AM, please contact night-coverage at [email or web address], password [facility] 09/03/2015, 11:07 AM  LOS: 2 days

## 2015-09-03 NOTE — Progress Notes (Signed)
Walking in hallway and heard loud noise. Go into pt room and see pt seated on the floor by bedside commode. She said she " had to go really fast and really bad". Instructed pt to use call bell which did not ring and she said she did. Bed alarm was not on at that time. Pt assessed. No injury noted. VSS. No bruises. No head contact. Patient finished using bathroom and was supervised and assisted back to bed with no problem. Bed alarm has now been turned on. Post fall huddle and assessment completed.

## 2015-09-04 LAB — COMPREHENSIVE METABOLIC PANEL
ALBUMIN: 2 g/dL — AB (ref 3.5–5.0)
ALK PHOS: 177 U/L — AB (ref 38–126)
ALT: 72 U/L — ABNORMAL HIGH (ref 14–54)
ANION GAP: 8 (ref 5–15)
AST: 118 U/L — ABNORMAL HIGH (ref 15–41)
BILIRUBIN TOTAL: 4.6 mg/dL — AB (ref 0.3–1.2)
BUN: 14 mg/dL (ref 6–20)
CALCIUM: 7.2 mg/dL — AB (ref 8.9–10.3)
CO2: 19 mmol/L — ABNORMAL LOW (ref 22–32)
Chloride: 109 mmol/L (ref 101–111)
Creatinine, Ser: 0.44 mg/dL (ref 0.44–1.00)
GFR calc non Af Amer: >60 mL/min (ref >60)
Glucose, Bld: 88 mg/dL (ref 65–99)
POTASSIUM: 3.9 mmol/L (ref 3.5–5.1)
SODIUM: 136 mmol/L (ref 135–145)
TOTAL PROTEIN: 4.7 g/dL — AB (ref 6.5–8.1)

## 2015-09-04 MED ORDER — DIAZEPAM 5 MG PO TABS
5.0000 mg | ORAL_TABLET | Freq: Three times a day (TID) | ORAL | Status: DC
  Administered 2015-09-04 – 2015-09-05 (×4): 5 mg via ORAL
  Filled 2015-09-04 (×4): qty 1

## 2015-09-04 NOTE — Evaluation (Signed)
Physical Therapy Evaluation Patient Details Name: Jennifer [name] MRN: [id number] DOB: 03-04-1971 Today's Date: 09/04/2015   History of Present Illness  Pt is a 45 yo F admitted to [facility] with nausea and vomiting secondary to alcohol withdrawal. She has a PMH of gastric bypass, pancreatitis, alcohol abuse, tobacco use, and depression. She has been drinking a fifth of liquor every day since 07/05/15.  Clinical Impression  Pt was alert and willing to cooperate with PT. She was A&Ox3 reporting 8/10 epigastric pain. Pt demonstrates gross deficits in strength evident by MMT of 4/5 of BUE and 4-/5 of BLE and functional mobility assessment. She is fairly indep with bed mobility, however she requires CGA to MinA with transfers as well as ambulation. Her ambulation distance was significantly limited due to pt's fear of falling, weakness and nausea. She demonstrates safety issues with walker placement and has increased trunk flexion not corrected with therapist cuing. At this time, she would benefit from assistance at d/c with mobility and OOB activity as well as continued PT services to improve her strength, mobility and safety awareness. She will also benefit from PT services while at [facility].     Follow Up Recommendations SNF    Equipment Recommendations  Rolling walker with 5" wheels    Recommendations for Other Services       Precautions / Restrictions        Mobility  Bed Mobility Overal bed mobility: Modified Independent                Transfers Overall transfer level: Needs assistance Equipment used: Rolling walker (2 wheeled) Transfers: Sit to/from Stand Sit to Stand: Min guard;Min assist         General transfer comment: Pt with increased trunk/UE flexion. Unable to correct with verbal cuing from therapist  Ambulation/Gait Ambulation/Gait assistance: Min guard Ambulation Distance (Feet): 15 Feet Assistive device: Rolling walker (2 wheeled) Gait Pattern/deviations: Decreased  step length - right;Decreased step length - left;Decreased stride length;Trunk flexed;Wide base of support   Gait velocity interpretation: <1.8 ft/sec, indicative of risk for recurrent falls General Gait Details: Pt demonstrating unsafe use of RW, requiring MinA for walker placement during turns,maneuvers  Stairs Stairs:  (Unable to assess this visit secondary to pt fatigue/nausea)          Wheelchair Mobility    Modified Rankin (Stroke Patients Only)       Balance Overall balance assessment: Needs assistance Sitting-balance support: Bilateral upper extremity supported Sitting balance-Leahy Scale: Fair Sitting balance - Comments: Pt with increased lean to the R during LE strength assessment   Standing balance support: Bilateral upper extremity supported Standing balance-Leahy Scale: Fair Standing balance comment: Pt with increased trunk flexion, UE flexion and shoulder shrug with RW                             Pertinent Vitals/Pain Pain Assessment: 0-10 Pain Score:  (Pt did not rate pain ) Pain Location: epigastric Pain Descriptors / Indicators: Aching Pain Intervention(s): Monitored during session;Patient requesting pain meds-RN notified    Home Living Family/patient expects to be discharged to:: Private residence Living Arrangements: Spouse/significant other (boyfriend and his mother)   Type of Home: House Home Access: Stairs to enter Entrance Stairs-Rails: Can reach both Entrance Stairs-Number of Steps: 2 Home Layout: One level Home Equipment: Wheelchair - manual      Prior Function Level of Independence: Independent  Hand Dominance        Extremity/Trunk Assessment   Upper Extremity Assessment: Generalized weakness           Lower Extremity Assessment: Generalized weakness         Communication   Communication: No difficulties  Cognition Arousal/Alertness: Awake/alert Behavior During Therapy: WFL for tasks  assessed/performed Overall Cognitive Status: No family/caregiver present to determine baseline cognitive functioning                      General Comments      Exercises General Exercises - Lower Extremity Ankle Circles/Pumps: AROM;Both;10 reps Quad Sets: AROM;Both;10 reps      Assessment/Plan    PT Assessment Patient needs continued PT services  PT Diagnosis Difficulty walking;Abnormality of gait;Generalized weakness   PT Problem List Decreased strength;Decreased activity tolerance;Decreased balance;Decreased mobility;Decreased safety awareness;Decreased knowledge of use of DME  PT Treatment Interventions DME instruction;Gait training;Stair training;Therapeutic activities;Functional mobility training;Therapeutic exercise;Patient/family education;Balance training   PT Goals (Current goals can be found in the Care Plan section)      Frequency Min 3X/week   Barriers to discharge Decreased caregiver support;Inaccessible home environment (Unable to assess stairs this session; potential barrier to d/c)      Co-evaluation               End of Session Equipment Utilized During Treatment: Gait belt Activity Tolerance: Patient limited by fatigue;Treatment limited secondary to medical complications (Comment) Patient left: in chair;with call bell/phone within reach;with chair alarm set Nurse Communication: Mobility status;Patient requests pain meds         Time: 1336-1415 PT Time Calculation (min) (ACUTE ONLY): 39 min   Charges:   PT Evaluation $PT Eval Low Complexity: 1 Procedure PT Treatments $Therapeutic Exercise: 8-22 mins $Therapeutic Activity: 23-37 mins   PT G Codes:          3:37 PM,09-16-2015 Marylyn Ishihara PT, DPT [facility] [phone]

## 2015-09-04 NOTE — Progress Notes (Signed)
[facility] PROGRESS NOTE  [name]:[id number] DOB: April 16, 1971 DOA: 09/01/2015 PCP: [facility]  Assessment/Plan: 1. Abdminal Pain -Jennifer Lea having a history of alcohol abuse presenting with complaints of abdominal pain associated with nausea and vomiting. -Possibilities include alcohol-induced pancreatitis, alcohol induced hepatitis or even gastritis.  -CT scan of abdomen and pelvis showing changes consistent with history of gastric bypass surgery. Lipase was 53 on a.m. lab work -By 09/04/2015 she is tolerating by mouth intake. Overall showing clinical improvement  2.  Alcohol induced hepatitis. -She has been drinking approximately a fifth of liquor daily for the past several months  -Initial labs showing AST of 523 with ALT of 142. AST to ALT ratio highly consistent with alcohol hepatitis. -Repeat lab work on 09/02/2000 showed improvement to liver function tests with AST of 216, ALT of 89, total bilirubin of 5.4. -Repeat labs showing ongoing improvement  3.  Alcohol abuse. -She was extensively counseled, explained liver findings on CT scan. Also explained that this was causing to other organs. -She continues to have some tremors and confusion -Overall think she is getting better, will stop scheduled IV Ativan and transition to a Valium taper. -Spoke with social services, plan to provide her with outpatient resources -Likely be stable for discharge in the next 24 hours  4.  Hypokalemia  -Lab showing potassium of 3.2 likely secondary to GI losses  -Replaced. Repeat lab work showing resolution to hypokalemia.  5.  Lactic acidosis  -Likely related to profound dehydration, lab showing downward trend in lactic acid with IV fluid resuscitation   6.  Metabolic Ketoacidosis -Likely secondary to starvation ketosis in setting of alcohol abuse -Resolved  Code Status: full code Family Communication:  Disposition Plan: Transitioning to Valium taper. Overall  showing clinical improvement. The patient will likely be safe for discharge in the next 24 hours. Provide her with community resources on alcoholism  HPI/Subjective: Jennifer [name] is a 45 year old female with a past medical history of alcohol abuse, pancreatitis, admitted to medicine service on 09/01/2015 when she presented with complaints of nausea, vomiting, abdominal pain. She reported drinking approximate fifth of liquor daily since December 2016. She reported developing multiple episodes of nausea and vomiting associate with abdominal pain over the past several weeks. She has had little PO intake. Labs reveal elevated transaminases.   Objective: Filed Vitals:   09/03/15 2100 09/04/15 0446  BP: 96/62 101/59  Pulse: 88 78  Temp: 98.3 F (36.8 C) 98.5 F (36.9 C)  Resp: 22 20    Intake/Output Summary (Last 24 hours) at 09/04/15 1009 Last data filed at 09/03/15 1700  Gross per 24 hour  Intake    480 ml  Output    300 ml  Net    180 ml   Filed Weights   09/01/15 1105  Weight: 81.647 kg (180 lb)    Exam:   General:  She seems better this morning, less tremors more awake and alert.  Cardiovascular: regular rate and rhythm normal S1-S2 no murmurs or gallops   Respiratory: normal respiratory effort   Abdomen: Abdomen doesn't appear as tender to palpation as was yesterday, no rebound tenderness or guarding  Musculoskeletal:  no edema   Data Reviewed: Basic Metabolic Panel:  Recent Labs Lab 09/01/15 1415 09/01/15 1949 09/02/15 0710 09/03/15 0724 09/04/15 0702  NA 138 133* 133* 134* 136  K 3.5 3.2* 3.8 3.7 3.9  CL 100* 97* 100* 106 109  CO2 18* 22 24 20* 19*  GLUCOSE  64* 112* 76 107* 88  BUN CREATININE 0.53 0.60 0.49 0.48 0.44  CALCIUM 8.1* 7.4* 7.2* 7.3* 7.2*   Liver Function Tests:  Recent Labs Lab 09/01/15 1415 09/01/15 1607 09/02/15 0710 09/03/15 0724 09/04/15 0702  AST 523*  --  369* 216* 118*  ALT 142*  --  107* 89* 72*  ALKPHOS 273*   --  212* 201* 177*  BILITOT 6.1* 5.7* 7.6* 5.4* 4.6*  PROT 6.6  --  5.1* 4.9* 4.7*  ALBUMIN 3.0*  --  2.3* 2.1* 2.0*    Recent Labs Lab 09/01/15 1415 09/02/15 0710  LIPASE 65* 53*   No results for input(s): AMMONIA in the last 168 hours. CBC:  Recent Labs Lab 09/01/15 1248  WBC 5.4  NEUTROABS 3.7  HGB 10.1*  HCT 30.5*  MCV 93.3  PLT 258   Cardiac Enzymes: No results for input(s): CKTOTAL, CKMB, CKMBINDEX, TROPONINI in the last 168 hours. BNP (last 3 results) No results for input(s): BNP in the last 8760 hours.  ProBNP (last 3 results) No results for input(s): PROBNP in the last 8760 hours.  CBG: No results for input(s): GLUCAP in the last 168 hours.  No results found for this or any previous visit (from the past 240 hour(s)).   Studies: No results found.  Scheduled Meds: . diazepam  5 mg Oral TID  . enoxaparin (LOVENOX) injection  40 mg Subcutaneous Q24H  . feeding supplement (PRO-STAT SUGAR FREE 64)  30 mL Oral TID  . folic acid  1 mg Oral Daily  . multivitamin with minerals  1 tablet Oral Daily  . nicotine  21 mg Transdermal Daily  . pantoprazole  40 mg Oral BID  . thiamine  100 mg Oral Daily   Or  . thiamine  100 mg Intravenous Daily   Continuous Infusions:    Active Problems:   Alcohol dependence (HCC)   Tobacco use disorder   Abdominal pain   Alcoholic hepatitis   Elevated bilirubin   Lactic acidosis   Metabolic acidosis, increased anion gap    Time spent: 25 min    Jennifer [name]  [facility] Pager [phone]. If 7PM-7AM, please contact night-coverage at [email or web address], password [facility] 09/04/2015, 10:09 AM  LOS: 3 days

## 2015-09-05 LAB — COMPREHENSIVE METABOLIC PANEL
ALK PHOS: 170 U/L — AB (ref 38–126)
ALT: 58 U/L — AB (ref 14–54)
AST: 88 U/L — AB (ref 15–41)
Albumin: 1.8 g/dL — ABNORMAL LOW (ref 3.5–5.0)
Anion gap: 6 (ref 5–15)
BUN: 13 mg/dL (ref 6–20)
CALCIUM: 7.4 mg/dL — AB (ref 8.9–10.3)
CO2: 22 mmol/L (ref 22–32)
CREATININE: 0.58 mg/dL (ref 0.44–1.00)
Chloride: 112 mmol/L — ABNORMAL HIGH (ref 101–111)
GFR calc non Af Amer: >60 mL/min (ref >60)
GLUCOSE: 73 mg/dL (ref 65–99)
Potassium: 3.6 mmol/L (ref 3.5–5.1)
SODIUM: 140 mmol/L (ref 135–145)
Total Bilirubin: 3.6 mg/dL — ABNORMAL HIGH (ref 0.3–1.2)
Total Protein: 4.4 g/dL — ABNORMAL LOW (ref 6.5–8.1)

## 2015-09-05 MED ORDER — DIAZEPAM 5 MG PO TABS: ORAL_TABLET | ORAL | Status: DC

## 2015-09-05 MED ORDER — ONDANSETRON 4 MG PO TBDP: 4.0000 mg | ORAL_TABLET | Freq: Three times a day (TID) | ORAL | Status: DC | PRN

## 2015-09-05 MED ORDER — OXYCODONE HCL 5 MG PO TABS: 5.0000 mg | ORAL_TABLET | Freq: Three times a day (TID) | ORAL | Status: DC | PRN

## 2015-09-05 NOTE — Progress Notes (Signed)
Physical Therapy Treatment Patient Details Name: Jennifer [name] MRN: [id number] DOB: 1970-08-07 Today's Date: 09/05/2015    History of Present Illness Pt is a 45 yo F admitted to [facility] with nausea and vomiting secondary to alcohol withdrawal. She has a PMH of gastric bypass, pancreatitis, alcohol abuse, tobacco use, and depression. She has been drinking a fifth of liquor every day since 07/05/15.    PT Comments    Pt is not agreeable to therapy today and needs to be talked into ambulation.  Pt able to come sit to stand x 5 reps but did not want to continue any other exercises.  Pt has improved significantly in mobilization from yesterday and therapist feels that she will no longer need skilled care but will benefit from home health therapy.   Follow Up Recommendations  Home health PT     Equipment Recommendations  Rolling walker with 5" wheels    Recommendations for Other Services  none     Precautions / Restrictions Precautions Precautions: Fall Restrictions Weight Bearing Restrictions: No    Mobility  Bed Mobility Overal bed mobility: Modified Independent                Transfers Overall transfer level: Needs assistance Equipment used: Rolling walker (2 wheeled) Transfers: Sit to/from Stand Sit to Stand: Supervision            Ambulation/Gait Ambulation/Gait assistance: Supervision Ambulation Distance (Feet): 40 Feet Assistive device: Rolling walker (2 wheeled) Gait Pattern/deviations: Decreased step length - left;Trunk flexed;Decreased stance time - right     General Gait Details: Improved use of RW              Cognition Arousal/Alertness: Awake/alert Behavior During Therapy: WFL for tasks assessed/performed Overall Cognitive Status: Within Functional Limits for tasks assessed                             Pertinent Vitals/Pain Pain Assessment: No/denies pain    Home Living                      Prior Function    Independent.           PT Goals (current goals can now be found in the care plan section) Acute Rehab PT Goals Patient Stated Goal: I want to get better and stronger PT Goal Formulation: With patient Time For Goal Achievement: 09/18/15 Potential to Achieve Goals: Good Progress towards PT goals: Progressing toward goals    Frequency  Min 3X/week    PT Plan Discharge plan needs to be updated       End of Session Equipment Utilized During Treatment: Gait belt Activity Tolerance: Patient tolerated treatment well Patient left: in chair;with call bell/phone within reach;with chair alarm set     Time: 4098-1191 PT Time Calculation (min) (ACUTE ONLY): 29 min  Charges:  $Gait Training: 23-37 mins                    G CodesVirgina Organ, PT CLT [phone] 09/05/2015, 8:39 AM

## 2015-09-05 NOTE — Discharge Summary (Signed)
Physician Discharge Summary  Jennifer [name]:[id number] DOB: 25-Jan-1971 DOA: 09/01/2015  PCP: [facility]  Admit date: 09/01/2015 Discharge date: 09/05/2015  Time spent: 35 minutes  Recommendations for Outpatient Follow-up:  1. Patient admitted for acute alcohol-hepatitis, having uncomplicated withdrawals 2. She was discharged on Valium taper for alcohol withdrawal, instructed to stop Valium taper if she were to resume drinking 3. She was given outpatient resources for drug/alcohol rehabilitation   Discharge Diagnoses:  Active Problems:   Alcohol dependence (HCC)   Tobacco use disorder   Abdominal pain   Alcoholic hepatitis   Elevated bilirubin   Lactic acidosis   Metabolic acidosis, increased anion gap   Discharge Condition: Stable  Diet recommendation: Regular diet  Filed Weights   09/01/15 1105  Weight: 81.647 kg (180 lb)    History of present illness:  Jennifer [name] is a 45 y.o. female  History of gastric bypass, pancreatitis, alcohol abuse, tobacco abuse. Patient presents to the emergency department with nausea and dry heaving proximally 2 weeks ago. Much of the history is provided by the patient's husband and the patient. The patient has been drinking about a fifth of liquor a day since 07/05/2015. As the patient was spending most of the time intoxicated, patient's husband helps her to decrease her drinking to approximately a pint a day. Over the past 2 weeks the patient has grown increasingly nauseated with right upper quadrant and epigastric abdominal pain and vomiting. The abdominal pain is nonradiating and is rated 10 out of 10. Pain medicine improves her pain. Eating and drinking increase her nausea and vomiting. She reports no diarrhea, constipation, chest pain, shortness of breath.  Hospital Course:  Jennifer [name] is a 45 year old female with a past medical history of alcohol abuse, pancreatitis, admitted to medicine service on 09/01/2015 when she  presented with complaints of nausea, vomiting, abdominal pain. She reported drinking approximate fifth of liquor daily since December 2016. She reported developing multiple episodes of nausea and vomiting associate with abdominal pain over the past several weeks. She has had little PO intake. Labs reveal elevated transaminases.    Abdminal Pain -Jennifer [name] having a history of alcohol abuse presenting with complaints of abdominal pain associated with nausea and vomiting. -Possibilities include alcohol-induced pancreatitis, alcohol induced hepatitis or even gastritis.  -CT scan of abdomen and pelvis showing changes consistent with history of gastric bypass surgery. Lipase was 53 on a.m. lab work -By 09/04/2015 she is tolerating by mouth intake. Overall showing clinical improvement   2. Alcohol induced hepatitis. -She has been drinking approximately a fifth of liquor daily for the past several months  -Initial labs showing AST of 523 with ALT of 142. AST to ALT ratio highly consistent with alcohol hepatitis. -Repeat lab work on 09/02/2000 showed improvement to liver function tests with AST of 216, ALT of 89, total bilirubin of 5.4. -Repeat labs showing ongoing improvement  3. Alcohol abuse. -She was extensively counseled, explained liver findings on CT scan. Also explained that this was causing to other organs. -She continues to have some tremors and confusion -Overall think she is getting better, will stop scheduled IV Ativan and transition to a Valium taper. -Spoke with social services, plan to provide her with outpatient resources -She was discharged on Valium taper, 5 mg by mouth 3 times a day times one week followed by 5 mg by mouth twice a day for one week followed by 5 g by mouth daily for 1 week. -She was distracted to  stop Valium taper if she were to resume drinking.  4. Hypokalemia  -Lab showing potassium of 3.2 likely secondary to GI losses  -Replaced. Repeat lab work showing  resolution to hypokalemia.  5. Lactic acidosis  -Likely related to profound dehydration, lab showing downward trend in lactic acid with IV fluid resuscitation   6. Metabolic Ketoacidosis -Likely secondary to starvation ketosis in setting of alcohol abuse -Resolved   Consultations:  Social work  Discharge Exam: Filed Vitals:   09/04/15 2017 09/05/15 0456  BP: 103/58 100/56  Pulse: 99 74  Temp: 97.5 F (36.4 C) 98.1 F (36.7 C)  Resp: 20 18    General: She is awake and alert, tremors mostly resolved.  Cardiovascular: regular rate and rhythm normal S1-S2 no murmurs or gallops   Respiratory: normal respiratory effort   Abdomen: Abdomen doesn't appear as tender to palpation as was yesterday, no rebound tenderness or guarding  Musculoskeletal: no edema  Discharge Instructions   Discharge Instructions    Call MD for:  difficulty breathing, headache or visual disturbances    Complete by:  As directed      Call MD for:  extreme fatigue    Complete by:  As directed      Call MD for:  hives    Complete by:  As directed      Call MD for:  persistant dizziness or light-headedness    Complete by:  As directed      Call MD for:  persistant nausea and vomiting    Complete by:  As directed      Call MD for:  redness, tenderness, or signs of infection (pain, swelling, redness, odor or green/yellow discharge around incision site)    Complete by:  As directed      Call MD for:  severe uncontrolled pain    Complete by:  As directed      Call MD for:  temperature >100.4    Complete by:  As directed      Call MD for:    Complete by:  As directed      Diet - low sodium heart healthy    Complete by:  As directed      Increase activity slowly    Complete by:  As directed           Current Discharge Medication List    START taking these medications   Details  diazepam (VALIUM) 5 MG tablet Take 1 tablet by mouth 3 times a day for one week, followed by 1 tablet by mouth  twice a day for one week, followed by 1 tablet by mouth daily for 1 week then stop Quantity sufficient for taper Qty: 30 tablet, Refills: 0    ondansetron (ZOFRAN ODT) 4 MG disintegrating tablet Take 1 tablet (4 mg total) by mouth every 8 (eight) hours as needed for nausea or vomiting. Qty: 10 tablet, Refills: 0    oxyCODONE (OXY IR/ROXICODONE) 5 MG immediate release tablet Take 1 tablet (5 mg total) by mouth every 8 (eight) hours as needed for severe pain. Qty: 5 tablet, Refills: 0      CONTINUE these medications which have NOT CHANGED   Details  Multiple Vitamin (MULTIVITAMIN WITH MINERALS) TABS tablet Take 1 tablet by mouth daily.    omeprazole (PRILOSEC) 20 MG capsule Take 20 mg by mouth every morning.       STOP taking these medications     nicotine (NICODERM CQ - DOSED IN MG/24 HOURS) 21  mg/24hr patch        Allergies  Allergen Reactions  . Aspirin Other (See Comments)    Cant take due to gastric bypass    Follow-up Information    Follow up with [facility] On 09/13/2015.   Why:  at 8:30 am. Please call if you need to cancel and  reschedule. Also bring ID and Proof of Income   Contact information:   [address] [place] [address] [phone]       Follow up with [facility].   Contact information:   [address] [place] [address] [phone]        The results of significant diagnostics from this hospitalization (including imaging, microbiology, ancillary and laboratory) are listed below for reference.    Significant Diagnostic Studies: US Abdomen Complete  09/01/2015  CLINICAL DATA:  45 year old female with a 2 week history of abdominal pain EXAM: ABDOMEN ULTRASOUND COMPLETE COMPARISON:  Prior CT scan abdomen/ pelvis 10/14/2014 FINDINGS: Echogenic material layers within the gallbladder lumen consistent with gallbladder sludge. Per the sonographer, the sonographic Eulah Pont sign is negative. There is no gallbladder wall thickening or  evidence of pericholecystic fluid. Common bile duct: Diameter: Within normal limits at 4-6 mm. Liver: No focal lesion identified. Increased echogenicity of the hepatic parenchyma with coarsening of the echotexture. The adjacent kidney is hypoechoic in comparison. Findings are consistent with steatosis. Main portal vein is patent. IVC: No abnormality visualized. Pancreas: Not well seen. Spleen: Size and appearance within normal limits. Right Kidney: Length: 10.4 cm. Echogenicity within normal limits. No mass or hydronephrosis visualized. Left Kidney: Length: 10.4 cm. Echogenicity within normal limits. No mass or hydronephrosis visualized. Abdominal aorta: No aneurysm visualized. Other findings: None. IMPRESSION: 1. Gallbladder sludge. No secondary sonographic findings of acute cholecystitis. 2. Hepatic steatosis. Electronically Signed   By: Malachy Moan M.D.   On: 09/01/2015 16:06   Ct Abdomen Pelvis W Contrast  09/01/2015  CLINICAL DATA:  Epigastric abdominal pain. History of gastric bypass surgery. EXAM: CT ABDOMEN AND PELVIS WITH CONTRAST TECHNIQUE: Multidetector CT imaging of the abdomen and pelvis was performed using the standard protocol following bolus administration of intravenous contrast. CONTRAST:  OMNIPAQUE IOHEXOL 300 MG/ML  SOLN COMPARISON:  10/14/2014 CT abdomen/ pelvis. Abdominal sonogram from earlier today. FINDINGS: Lower chest: No significant pulmonary nodules or acute consolidative airspace disease. Hepatobiliary: Hepatomegaly. Severe diffuse hepatic steatosis. No liver mass. No definite liver surface irregularity. Layering sludge in the gallbladder with no radiopaque cholelithiasis and no definite gallbladder wall thickening. No pericholecystic fluid. No biliary ductal dilatation. Pancreas: Normal, with no mass or duct dilation. Spleen: Normal size. No mass. Adrenals/Urinary Tract: Normal adrenals. Normal kidneys with no hydronephrosis and no renal mass. Normal bladder.  Stomach/Bowel: The patient is status post gastric bypass surgery. There is a moderate hiatal hernia, which contains a proximal gastric pouch, intact appearing gastrojejunostomy and proximal jejunal limb of the gastrojejunostomy, which is mildly distended with fluid, with no wall thickening. The excluded distal stomach is relatively collapsed and fluid-filled with no gastric wall thickening. Otherwise no small bowel wall thickening or small bowel dilatation. Normal appendix. Oral contrast progresses to the distal rectum. Normal large bowel with no diverticulosis, large bowel wall thickening or pericolonic fat stranding. Vascular/Lymphatic: Normal caliber abdominal aorta. Patent portal, splenic, hepatic and renal veins. No pathologically enlarged lymph nodes in the abdomen or pelvis. Reproductive: Grossly normal uterus.  No adnexal mass. Other: No pneumoperitoneum, ascites or focal fluid collection.  Musculoskeletal: No aggressive appearing focal osseous lesions. Moderate degenerative changes in the visualized thoracolumbar spine. IMPRESSION: 1. Moderate hiatal hernia containing the gastric pouch, gastrojejunostomy and proximal jejunal limb of the gastrojejunostomy. Proximal jejunal limb within the hiatal hernia is mildly distended with fluid, with no wall thickening. 2. No evidence of bowel obstruction or acute bowel inflammation. Oral contrast progresses to the distal colon. 3. Hepatomegaly with severe diffuse hepatic steatosis. Recommend correlation with clinical history to screen for alcohol abuse. Electronically Signed   By: Delbert Phenix M.D.   On: 09/01/2015 19:47   Dg Chest Port 1 View  09/01/2015  CLINICAL DATA:  45 year old female with cough, congestion EXAM: PORTABLE CHEST 1 VIEW COMPARISON:  None. FINDINGS: The lungs are clear and negative for focal airspace consolidation, pulmonary edema or suspicious pulmonary nodule. No pleural effusion or pneumothorax. Cardiac and mediastinal contours are within  normal limits. No acute fracture or lytic or blastic osseous lesions. The visualized upper abdominal bowel gas pattern is unremarkable. IMPRESSION: No active disease. Electronically Signed   By: Malachy Moan M.D.   On: 09/01/2015 12:45    Microbiology: No results found for this or any previous visit (from the past 240 hour(s)).   Labs: Basic Metabolic Panel:  Recent Labs Lab 09/01/15 1949 09/02/15 0710 09/03/15 0724 09/04/15 0702 09/05/15 0615  NA 133* 133* 134* 136 140  K 3.2* 3.8 3.7 3.9 3.6  CL 97* 100* 106 109 112*  CO2 22 24 20* 19* 22  GLUCOSE 112* 76 107* 88 73  BUN 13 12 12 14 13   CREATININE 0.60 0.49 0.48 0.44 0.58  CALCIUM 7.4* 7.2* 7.3* 7.2* 7.4*   Liver Function Tests:  Recent Labs Lab 09/01/15 1415 09/01/15 1607 09/02/15 0710 09/03/15 0724 09/04/15 0702 09/05/15 0615  AST 523*  --  369* 216* 118* 88*  ALT 142*  --  107* 89* 72* 58*  ALKPHOS 273*  --  212* 201* 177* 170*  BILITOT 6.1* 5.7* 7.6* 5.4* 4.6* 3.6*  PROT 6.6  --  5.1* 4.9* 4.7* 4.4*  ALBUMIN 3.0*  --  2.3* 2.1* 2.0* 1.8*    Recent Labs Lab 09/01/15 1415 09/02/15 0710  LIPASE 65* 53*   No results for input(s): AMMONIA in the last 168 hours. CBC:  Recent Labs Lab 09/01/15 1248  WBC 5.4  NEUTROABS 3.7  HGB 10.1*  HCT 30.5*  MCV 93.3  PLT 258   Cardiac Enzymes: No results for input(s): CKTOTAL, CKMB, CKMBINDEX, TROPONINI in the last 168 hours. BNP: BNP (last 3 results) No results for input(s): BNP in the last 8760 hours.  ProBNP (last 3 results) No results for input(s): PROBNP in the last 8760 hours.  CBG: No results for input(s): GLUCAP in the last 168 hours.     Signed:  Jeralyn Bennett MD.  [facility] 09/05/2015, 11:45 AM

## 2015-09-05 NOTE — Care Management Note (Signed)
Case Management Note  Patient Details  Name: Jennifer [name] MRN: [id number] Date of Birth: [birth year]  Subjective/Objective:                  Spoke with patient who is from home, She is sleepy but alert and oriented and answers questions appropriately. Patient stated that she has no income and cannot get Medicaid due to not having any children at home. Pt has recommended home health PT but patient is uninsured.  Patient will need medication assistance. MATCH voucher given.   Action/Plan:  Discharge home with self care.   Expected Discharge Date:  09/04/15               Expected Discharge Plan:  Home/Self Care  In-House Referral:     Discharge planning Services  CM Consult  Post Acute Care Choice:    Choice offered to:     DME Arranged:    DME Agency:     HH Arranged:    HH Agency:     Status of Service:  Completed, signed off  Medicare Important Message Given:    Date Medicare IM Given:    Medicare IM give by:    Date Additional Medicare IM Given:    Additional Medicare Important Message give by:     If discussed at Long Length of Stay Meetings, dates discussed:    Additional Comments:  Adonis Huguenin, RN 09/05/2015, 12:31 PM

## 2015-09-05 NOTE — Progress Notes (Signed)
Patient states understanding of discharge instructions, prescriptions given 

## 2015-09-18 ENCOUNTER — Emergency Department (HOSPITAL_COMMUNITY): Payer: Medicaid Other

## 2015-09-18 ENCOUNTER — Emergency Department (HOSPITAL_COMMUNITY)
Admission: EM | Admit: 2015-09-18 | Discharge: 2015-09-18 | Disposition: A | Discharge status: Home / Self Care | Payer: Medicaid Other | Attending: Emergency Medicine | Admitting: Emergency Medicine

## 2015-09-18 ENCOUNTER — Encounter (HOSPITAL_COMMUNITY): Payer: Self-pay | Admitting: *Deleted

## 2015-09-18 DIAGNOSIS — R600 Localized edema: Secondary | ICD-10-CM | POA: Insufficient documentation

## 2015-09-18 DIAGNOSIS — Z79899 Other long term (current) drug therapy: Secondary | ICD-10-CM | POA: Insufficient documentation

## 2015-09-18 DIAGNOSIS — F329 Major depressive disorder, single episode, unspecified: Secondary | ICD-10-CM | POA: Diagnosis not present

## 2015-09-18 DIAGNOSIS — N390 Urinary tract infection, site not specified: Secondary | ICD-10-CM | POA: Diagnosis not present

## 2015-09-18 DIAGNOSIS — R609 Edema, unspecified: Secondary | ICD-10-CM

## 2015-09-18 DIAGNOSIS — K769 Liver disease, unspecified: Secondary | ICD-10-CM | POA: Diagnosis not present

## 2015-09-18 DIAGNOSIS — F17210 Nicotine dependence, cigarettes, uncomplicated: Secondary | ICD-10-CM | POA: Insufficient documentation

## 2015-09-18 LAB — URINALYSIS, ROUTINE W REFLEX MICROSCOPIC
Glucose, UA: 100 mg/dL — AB
NITRITE: POSITIVE — AB
PH: 5.5 (ref 5.0–8.0)
Protein, ur: NEGATIVE mg/dL
SPECIFIC GRAVITY, URINE: 1.025 (ref 1.005–1.030)

## 2015-09-18 LAB — COMPREHENSIVE METABOLIC PANEL
ALBUMIN: 2.3 g/dL — AB (ref 3.5–5.0)
ALK PHOS: 209 U/L — AB (ref 38–126)
ALT: 85 U/L — ABNORMAL HIGH (ref 14–54)
AST: 140 U/L — AB (ref 15–41)
Anion gap: 9 (ref 5–15)
BILIRUBIN TOTAL: 4.3 mg/dL — AB (ref 0.3–1.2)
BUN: 3 mg/dL — AB (ref 6–20)
CO2: 23 mmol/L (ref 22–32)
Calcium: 8.1 mg/dL — ABNORMAL LOW (ref 8.9–10.3)
Chloride: 108 mmol/L (ref 101–111)
Creatinine, Ser: 0.56 mg/dL (ref 0.44–1.00)
GFR calc Af Amer: >60 mL/min (ref >60)
GFR calc non Af Amer: >60 mL/min (ref >60)
GLUCOSE: 115 mg/dL — AB (ref 65–99)
POTASSIUM: 4 mmol/L (ref 3.5–5.1)
SODIUM: 140 mmol/L (ref 135–145)
Total Protein: 5.6 g/dL — ABNORMAL LOW (ref 6.5–8.1)

## 2015-09-18 LAB — CBC WITH DIFFERENTIAL/PLATELET
BASOS ABS: 0.1 10*3/uL (ref 0.0–0.1)
BASOS PCT: 2 %
EOS ABS: 0.1 10*3/uL (ref 0.0–0.7)
Eosinophils Relative: 1 %
HEMATOCRIT: 31.7 % — AB (ref 36.0–46.0)
HEMOGLOBIN: 10.4 g/dL — AB (ref 12.0–15.0)
Lymphocytes Relative: 31 %
Lymphs Abs: 2.1 10*3/uL (ref 0.7–4.0)
MCH: 33.4 pg (ref 26.0–34.0)
MCHC: 32.8 g/dL (ref 30.0–36.0)
MCV: 101.9 fL — ABNORMAL HIGH (ref 78.0–100.0)
MONOS PCT: 7 %
Monocytes Absolute: 0.5 10*3/uL (ref 0.1–1.0)
NEUTROS ABS: 4.1 10*3/uL (ref 1.7–7.7)
NEUTROS PCT: 59 %
Platelets: 321 10*3/uL (ref 150–400)
RBC: 3.11 MIL/uL — ABNORMAL LOW (ref 3.87–5.11)
RDW: 18 % — AB (ref 11.5–15.5)
WBC: 6.8 10*3/uL (ref 4.0–10.5)

## 2015-09-18 LAB — URINE MICROSCOPIC-ADD ON

## 2015-09-18 LAB — ETHANOL

## 2015-09-18 LAB — BRAIN NATRIURETIC PEPTIDE: B Natriuretic Peptide: 126 pg/mL — ABNORMAL HIGH (ref 0.0–100.0)

## 2015-09-18 LAB — TROPONIN I: Troponin I: <0.03 ng/mL (ref <0.031)

## 2015-09-18 MED ORDER — PROMETHAZINE HCL 25 MG PO TABS: 25.0000 mg | ORAL_TABLET | Freq: Four times a day (QID) | ORAL | Status: DC | PRN

## 2015-09-18 MED ORDER — CEPHALEXIN 500 MG PO CAPS: 500.0000 mg | ORAL_CAPSULE | Freq: Four times a day (QID) | ORAL | Status: DC

## 2015-09-18 MED ORDER — PROMETHAZINE HCL 25 MG/ML IJ SOLN: 12.5000 mg | Freq: Once | INTRAMUSCULAR | Status: DC

## 2015-09-18 MED ORDER — PROMETHAZINE HCL 25 MG/ML IJ SOLN
12.5000 mg | Freq: Once | INTRAMUSCULAR | Status: AC
  Administered 2015-09-18: 12.5 mg via INTRAMUSCULAR
  Filled 2015-09-18: qty 1

## 2015-09-18 MED ORDER — CEPHALEXIN 500 MG PO CAPS
500.0000 mg | ORAL_CAPSULE | ORAL | Status: AC
  Administered 2015-09-18: 500 mg via ORAL
  Filled 2015-09-18: qty 1

## 2015-09-18 MED ORDER — MORPHINE SULFATE (PF) 2 MG/ML IV SOLN
4.0000 mg | Freq: Once | INTRAVENOUS | Status: AC
  Administered 2015-09-18: 4 mg via INTRAMUSCULAR
  Filled 2015-09-18: qty 2

## 2015-09-18 NOTE — Discharge Instructions (Signed)

## 2015-09-18 NOTE — ED Notes (Signed)
Pt comes in for leg swelling and redness. Pt states this started yesterday. Pt states she has trouble breathing when she ambulates. Pt is alert and oriented at this time.

## 2015-09-18 NOTE — ED Provider Notes (Signed)
CSN: [id number]     Arrival date & time 09/18/15  1252 History   First MD Initiated Contact with Patient 09/18/15 1532     Chief Complaint  Patient presents with  . Leg Swelling   HPI Patient was in the hospital last month for alcohol detox. Patient was released from the hospital. She comes into the emergency room for evaluation of leg swelling and redness. Patient states she's had swelling for a longer period of time and now .  she's had her legs wrapped..   However when she took those off she noticed redness on the sides of her legs. She has an itching discomfort. She also has noticed a pins and needles type discomfort. She denies any vomiting but she still feels nauseated. The patient states she went to see her doctor in the clinic but they did not give her a refill for her Phenergan. Past Medical History  Diagnosis Date  . Gastric bypass status for obesity   . Pancreatitis   . Depression   . Alcohol abuse    Past Surgical History  Procedure Laterality Date  . Gastric bypass     No family history on file. Social History  Substance Use Topics  . Smoking status: Current Every Day Smoker -- 0.50 packs/day    Types: Cigarettes  . Smokeless tobacco: None  . Alcohol Use: Yes     Comment: 1 pint over the past 2 weeks, 1/5 or more prior   OB History    Gravida Para Term Preterm AB TAB SAB Ectopic Multiple Living   5 3  3 2 2          Review of Systems  All other systems reviewed and are negative.     Allergies  Aspirin  Home Medications   Prior to Admission medications   Medication Sig Start Date End Date Taking? Authorizing Provider  Multiple Vitamin (MULTIVITAMIN WITH MINERALS) TABS tablet Take 1 tablet by mouth daily. 10/18/14  Yes Lesle ChrisKaren M Black, NP  omeprazole (PRILOSEC) 20 MG capsule Take 20 mg by mouth every morning.    Yes Historical Provider, MD  diazepam (VALIUM) 5 MG tablet Take 1 tablet by mouth 3 times a day for one week, followed by 1 tablet by mouth twice a day  for one week, followed by 1 tablet by mouth daily for 1 week then stop Quantity sufficient for taper Patient not taking: Reported on 09/18/2015 09/05/15   Jeralyn BennettEzequiel Zamora, MD  ondansetron (ZOFRAN ODT) 4 MG disintegrating tablet Take 1 tablet (4 mg total) by mouth every 8 (eight) hours as needed for nausea or vomiting. Patient not taking: Reported on 09/18/2015 09/05/15   Jeralyn BennettEzequiel Zamora, MD  oxyCODONE (OXY IR/ROXICODONE) 5 MG immediate release tablet Take 1 tablet (5 mg total) by mouth every 8 (eight) hours as needed for severe pain. Patient not taking: Reported on 09/18/2015 09/05/15   Jeralyn BennettEzequiel Zamora, MD   BP 107/67 mmHg  Pulse 98  Temp(Src) 98.3 F (36.8 C) (Oral)  Resp 18  Ht 5\' 7"  (1.702 m)  Wt 78.019 kg  BMI 26.93 kg/m2  SpO2 100%  LMP 08/29/2015 Physical Exam  Constitutional: No distress.  Appears older than her stated age  HENT:  Head: Normocephalic and atraumatic.  Right Ear: External ear normal.  Left Ear: External ear normal.  Eyes: Conjunctivae are normal. Right eye exhibits no discharge. Left eye exhibits no discharge. No scleral icterus.  Neck: Neck supple. No tracheal deviation present.  Cardiovascular: Normal rate, regular rhythm  and intact distal pulses.   Pulmonary/Chest: Effort normal and breath sounds normal. No stridor. No respiratory distress. She has no wheezes. She has no rales.  Abdominal: Soft. Bowel sounds are normal. She exhibits no distension. There is no tenderness. There is no rebound and no guarding.  Musculoskeletal: She exhibits edema (pitting edema bilaterally). She exhibits no tenderness.  Neurological: She is alert. She has normal strength. No cranial nerve deficit (no facial droop, extraocular movements intact, no slurred speech) or sensory deficit. She exhibits normal muscle tone. She displays no seizure activity. Coordination normal.  Skin: Skin is warm and dry. There is pallor.  Erythematous rash noted on the lateral aspect of the right leg also some  affecting the left lower leg, no lymphangitic streaking, no increased warmth  Psychiatric: She has a normal mood and affect.  Nursing note and vitals reviewed.   ED Course  Procedures (including critical care time) Labs Review Labs Reviewed  CBC WITH DIFFERENTIAL/PLATELET - Abnormal; Notable for the following:    RBC 3.11 (*)    Hemoglobin 10.4 (*)    HCT 31.7 (*)    MCV 101.9 (*)    RDW 18.0 (*)    All other components within normal limits  COMPREHENSIVE METABOLIC PANEL - Abnormal; Notable for the following:    Glucose, Bld 115 (*)    BUN 3 (*)    Calcium 8.1 (*)    Total Protein 5.6 (*)    Albumin 2.3 (*)    AST 140 (*)    ALT 85 (*)    Alkaline Phosphatase 209 (*)    Total Bilirubin 4.3 (*)    All other components within normal limits  BRAIN NATRIURETIC PEPTIDE - Abnormal; Notable for the following:    B Natriuretic Peptide 126.0 (*)    All other components within normal limits  URINALYSIS, ROUTINE W REFLEX MICROSCOPIC (NOT AT [facility]) - Abnormal; Notable for the following:    Color, Urine BROWN (*)    APPearance CLOUDY (*)    Glucose, UA 100 (*)    Hgb urine dipstick TRACE (*)    Bilirubin Urine LARGE (*)    Ketones, ur TRACE (*)    Nitrite POSITIVE (*)    Leukocytes, UA MODERATE (*)    All other components within normal limits  URINE MICROSCOPIC-ADD ON - Abnormal; Notable for the following:    Squamous Epithelial / LPF 6-30 (*)    Bacteria, UA MANY (*)    All other components within normal limits  TROPONIN I  ETHANOL    Imaging Review Dg Chest 2 View  09/18/2015  CLINICAL DATA:  Productive cough with congestion and shortness of breath. Smoker. EXAM: CHEST  2 VIEW COMPARISON:  Portable chest 09/01/2015.  Abdominal CT 09/01/2015. FINDINGS: The heart size and mediastinal contours are stable. There is a moderate size hiatal hernia. Surgical clips are present within the upper abdomen. There is mild linear scarring or atelectasis in the right perihilar region. No edema,  confluent airspace opacity or significant pleural effusion. The bones appear unremarkable. IMPRESSION: No acute cardiopulmonary process. Postsurgical changes from previous gastric bypass with stable moderate size hiatal hernia. Electronically Signed   By: Carey Bullocks M.D.   On: 09/18/2015 13:47   I have personally reviewed and evaluated these images and lab results as part of my medical decision-making.   MDM   Final diagnoses:  Peripheral edema  Liver disease  UTI (lower urinary tract infection)    Pt has persistent peripheral edema related  to her liver disease.  Rash on her leg does not suggest a cellulitis but rather a dermatitis.  Her UA is abnormal though and I will start her on keflex that would cover possible cellulitis as well. Labs otherwise are stable.  Pt requests refill of her phenergan.  No vomiting in the ED.  Dc with keflex and phenergan.  Follow up with PCP   Linwood Dibbles, MD 09/18/15 [phone]

## 2015-09-18 NOTE — ED Notes (Addendum)
Pt upset because we did not provide lunch  and supper for her. Also, upset because she was not admitted to the hospital. States her husband gets mad when he has to bring her and come back to pick her up. Pt was eating crackers and drinking ginger ale at discharge

## 2015-09-28 ENCOUNTER — Encounter (HOSPITAL_COMMUNITY): Payer: Self-pay | Admitting: Emergency Medicine

## 2015-09-28 ENCOUNTER — Inpatient Hospital Stay (HOSPITAL_COMMUNITY)
Admission: EM | Admit: 2015-09-28 | Discharge: 2015-10-17 | DRG: 871 | Disposition: A | Discharge status: Home / Self Care | Payer: Medicaid Other | Attending: Internal Medicine | Admitting: Internal Medicine

## 2015-09-28 ENCOUNTER — Emergency Department (HOSPITAL_COMMUNITY): Payer: Medicaid Other

## 2015-09-28 DIAGNOSIS — K7010 Alcoholic hepatitis without ascites: Secondary | ICD-10-CM | POA: Diagnosis present

## 2015-09-28 DIAGNOSIS — Z8249 Family history of ischemic heart disease and other diseases of the circulatory system: Secondary | ICD-10-CM | POA: Diagnosis not present

## 2015-09-28 DIAGNOSIS — K760 Fatty (change of) liver, not elsewhere classified: Secondary | ICD-10-CM | POA: Diagnosis present

## 2015-09-28 DIAGNOSIS — F17200 Nicotine dependence, unspecified, uncomplicated: Secondary | ICD-10-CM | POA: Diagnosis present

## 2015-09-28 DIAGNOSIS — I959 Hypotension, unspecified: Secondary | ICD-10-CM | POA: Diagnosis present

## 2015-09-28 DIAGNOSIS — T502X5A Adverse effect of carbonic-anhydrase inhibitors, benzothiadiazides and other diuretics, initial encounter: Secondary | ICD-10-CM | POA: Diagnosis present

## 2015-09-28 DIAGNOSIS — E872 Acidosis, unspecified: Secondary | ICD-10-CM

## 2015-09-28 DIAGNOSIS — E440 Moderate protein-calorie malnutrition: Secondary | ICD-10-CM | POA: Diagnosis present

## 2015-09-28 DIAGNOSIS — K2971 Gastritis, unspecified, with bleeding: Secondary | ICD-10-CM | POA: Diagnosis present

## 2015-09-28 DIAGNOSIS — A419 Sepsis, unspecified organism: Secondary | ICD-10-CM | POA: Diagnosis present

## 2015-09-28 DIAGNOSIS — D696 Thrombocytopenia, unspecified: Secondary | ICD-10-CM | POA: Diagnosis present

## 2015-09-28 DIAGNOSIS — D689 Coagulation defect, unspecified: Secondary | ICD-10-CM | POA: Diagnosis present

## 2015-09-28 DIAGNOSIS — K259 Gastric ulcer, unspecified as acute or chronic, without hemorrhage or perforation: Secondary | ICD-10-CM | POA: Diagnosis present

## 2015-09-28 DIAGNOSIS — E876 Hypokalemia: Secondary | ICD-10-CM | POA: Diagnosis present

## 2015-09-28 DIAGNOSIS — Z9884 Bariatric surgery status: Secondary | ICD-10-CM | POA: Diagnosis not present

## 2015-09-28 DIAGNOSIS — R945 Abnormal results of liver function studies: Secondary | ICD-10-CM

## 2015-09-28 DIAGNOSIS — N179 Acute kidney failure, unspecified: Secondary | ICD-10-CM | POA: Diagnosis not present

## 2015-09-28 DIAGNOSIS — B372 Candidiasis of skin and nail: Secondary | ICD-10-CM | POA: Diagnosis present

## 2015-09-28 DIAGNOSIS — E162 Hypoglycemia, unspecified: Secondary | ICD-10-CM | POA: Diagnosis not present

## 2015-09-28 DIAGNOSIS — D62 Acute posthemorrhagic anemia: Secondary | ICD-10-CM | POA: Diagnosis not present

## 2015-09-28 DIAGNOSIS — K852 Alcohol induced acute pancreatitis without necrosis or infection: Secondary | ICD-10-CM | POA: Diagnosis present

## 2015-09-28 DIAGNOSIS — L03115 Cellulitis of right lower limb: Secondary | ICD-10-CM | POA: Diagnosis present

## 2015-09-28 DIAGNOSIS — R7989 Other specified abnormal findings of blood chemistry: Secondary | ICD-10-CM | POA: Diagnosis present

## 2015-09-28 DIAGNOSIS — F329 Major depressive disorder, single episode, unspecified: Secondary | ICD-10-CM | POA: Diagnosis present

## 2015-09-28 DIAGNOSIS — F1020 Alcohol dependence, uncomplicated: Secondary | ICD-10-CM | POA: Diagnosis present

## 2015-09-28 DIAGNOSIS — Z87440 Personal history of urinary (tract) infections: Secondary | ICD-10-CM

## 2015-09-28 DIAGNOSIS — E860 Dehydration: Secondary | ICD-10-CM | POA: Diagnosis present

## 2015-09-28 DIAGNOSIS — F17210 Nicotine dependence, cigarettes, uncomplicated: Secondary | ICD-10-CM | POA: Diagnosis present

## 2015-09-28 DIAGNOSIS — L03116 Cellulitis of left lower limb: Secondary | ICD-10-CM | POA: Diagnosis present

## 2015-09-28 DIAGNOSIS — L03119 Cellulitis of unspecified part of limb: Secondary | ICD-10-CM | POA: Diagnosis present

## 2015-09-28 DIAGNOSIS — E8770 Fluid overload, unspecified: Secondary | ICD-10-CM | POA: Diagnosis present

## 2015-09-28 DIAGNOSIS — D6959 Other secondary thrombocytopenia: Secondary | ICD-10-CM | POA: Diagnosis present

## 2015-09-28 DIAGNOSIS — F1010 Alcohol abuse, uncomplicated: Secondary | ICD-10-CM | POA: Diagnosis present

## 2015-09-28 DIAGNOSIS — K3189 Other diseases of stomach and duodenum: Secondary | ICD-10-CM | POA: Diagnosis present

## 2015-09-28 DIAGNOSIS — L0390 Cellulitis, unspecified: Secondary | ICD-10-CM | POA: Diagnosis present

## 2015-09-28 DIAGNOSIS — K7689 Other specified diseases of liver: Secondary | ICD-10-CM | POA: Diagnosis present

## 2015-09-28 DIAGNOSIS — R6510 Systemic inflammatory response syndrome (SIRS) of non-infectious origin without acute organ dysfunction: Secondary | ICD-10-CM | POA: Diagnosis present

## 2015-09-28 DIAGNOSIS — Z6826 Body mass index (BMI) 26.0-26.9, adult: Secondary | ICD-10-CM | POA: Diagnosis not present

## 2015-09-28 DIAGNOSIS — F32A Depression, unspecified: Secondary | ICD-10-CM | POA: Diagnosis present

## 2015-09-28 HISTORY — DX: Gastric ulcer, unspecified as acute or chronic, without hemorrhage or perforation: K25.9

## 2015-09-28 HISTORY — DX: Other specified diseases of liver: K76.89

## 2015-09-28 HISTORY — DX: Alcoholic hepatitis without ascites: K70.10

## 2015-09-28 HISTORY — DX: Gastro-esophageal reflux disease without esophagitis: K21.9

## 2015-09-28 HISTORY — DX: Reserved for inherently not codable concepts without codable children: IMO0001

## 2015-09-28 HISTORY — DX: Acute posthemorrhagic anemia: D62

## 2015-09-28 LAB — CBC WITH DIFFERENTIAL/PLATELET
Basophils Absolute: 0.1 10*3/uL (ref 0.0–0.1)
Basophils Relative: 1 %
EOS ABS: 0.1 10*3/uL (ref 0.0–0.7)
EOS PCT: 1 %
HCT: 28.3 % — ABNORMAL LOW (ref 36.0–46.0)
Hemoglobin: 9.3 g/dL — ABNORMAL LOW (ref 12.0–15.0)
LYMPHS ABS: 1 10*3/uL (ref 0.7–4.0)
LYMPHS PCT: 13 %
MCH: 32.3 pg (ref 26.0–34.0)
MCHC: 32.9 g/dL (ref 30.0–36.0)
MCV: 98.3 fL (ref 78.0–100.0)
MONO ABS: 0.8 10*3/uL (ref 0.1–1.0)
MONOS PCT: 10 %
Neutro Abs: 5.9 10*3/uL (ref 1.7–7.7)
Neutrophils Relative %: 75 %
PLATELETS: 123 10*3/uL — AB (ref 150–400)
RBC: 2.88 MIL/uL — AB (ref 3.87–5.11)
RDW: 18.2 % — AB (ref 11.5–15.5)
WBC: 7.8 10*3/uL (ref 4.0–10.5)

## 2015-09-28 LAB — COMPREHENSIVE METABOLIC PANEL
ALK PHOS: 225 U/L — AB (ref 38–126)
ALT: 94 U/L — AB (ref 14–54)
AST: 74 U/L — AB (ref 15–41)
Albumin: 1.8 g/dL — ABNORMAL LOW (ref 3.5–5.0)
Anion gap: 9 (ref 5–15)
BUN: 9 mg/dL (ref 6–20)
CALCIUM: 7.6 mg/dL — AB (ref 8.9–10.3)
CO2: 20 mmol/L — AB (ref 22–32)
CREATININE: 0.97 mg/dL (ref 0.44–1.00)
Chloride: 108 mmol/L (ref 101–111)
GFR calc non Af Amer: >60 mL/min (ref >60)
Glucose, Bld: 124 mg/dL — ABNORMAL HIGH (ref 65–99)
Potassium: 4.4 mmol/L (ref 3.5–5.1)
SODIUM: 137 mmol/L (ref 135–145)
Total Bilirubin: 6 mg/dL — ABNORMAL HIGH (ref 0.3–1.2)
Total Protein: 4.9 g/dL — ABNORMAL LOW (ref 6.5–8.1)

## 2015-09-28 LAB — LACTIC ACID, PLASMA
LACTIC ACID, VENOUS: 4.7 mmol/L — AB (ref 0.5–2.0)
Lactic Acid, Venous: 4.3 mmol/L (ref 0.5–2.0)

## 2015-09-28 LAB — URINE MICROSCOPIC-ADD ON: RBC / HPF: NONE SEEN RBC/hpf (ref 0–5)

## 2015-09-28 LAB — URINALYSIS, ROUTINE W REFLEX MICROSCOPIC
Glucose, UA: 100 mg/dL — AB
Hgb urine dipstick: NEGATIVE
NITRITE: POSITIVE — AB
PH: 6.5 (ref 5.0–8.0)
PROTEIN: 30 mg/dL — AB
Specific Gravity, Urine: 1.025 (ref 1.005–1.030)

## 2015-09-28 LAB — LIPASE, BLOOD: Lipase: 21 U/L (ref 11–51)

## 2015-09-28 LAB — PROTIME-INR
INR: 1.37 (ref 0.00–1.49)
PROTHROMBIN TIME: 17 s — AB (ref 11.6–15.2)

## 2015-09-28 LAB — I-STAT CG4 LACTIC ACID, ED: Lactic Acid, Venous: 4.34 mmol/L (ref 0.5–2.0)

## 2015-09-28 MED ORDER — SODIUM CHLORIDE 0.9 % IV SOLN
INTRAVENOUS | Status: DC
  Administered 2015-09-29: 13:00:00 via INTRAVENOUS

## 2015-09-28 MED ORDER — VANCOMYCIN HCL IN DEXTROSE 1-5 GM/200ML-% IV SOLN: 1000.0000 mg | Freq: Once | INTRAVENOUS | Status: DC

## 2015-09-28 MED ORDER — PIPERACILLIN-TAZOBACTAM 3.375 G IVPB: 3.3750 g | Freq: Three times a day (TID) | INTRAVENOUS | Status: DC

## 2015-09-28 MED ORDER — VANCOMYCIN HCL IN DEXTROSE 1-5 GM/200ML-% IV SOLN
1000.0000 mg | Freq: Two times a day (BID) | INTRAVENOUS | Status: DC
  Administered 2015-09-29 – 2015-10-02 (×6): 1000 mg via INTRAVENOUS
  Filled 2015-09-28 (×6): qty 200

## 2015-09-28 MED ORDER — PIPERACILLIN-TAZOBACTAM 3.375 G IVPB 30 MIN
3.3750 g | Freq: Once | INTRAVENOUS | Status: AC
  Administered 2015-09-28: 3.375 g via INTRAVENOUS
  Filled 2015-09-28: qty 50

## 2015-09-28 MED ORDER — ONDANSETRON HCL 4 MG/2ML IJ SOLN
4.0000 mg | Freq: Once | INTRAMUSCULAR | Status: AC
  Administered 2015-09-28: 4 mg via INTRAVENOUS
  Filled 2015-09-28: qty 2

## 2015-09-28 MED ORDER — PROMETHAZINE HCL 12.5 MG PO TABS
12.5000 mg | ORAL_TABLET | Freq: Four times a day (QID) | ORAL | Status: DC | PRN
  Administered 2015-09-30 – 2015-10-10 (×3): 12.5 mg via ORAL
  Filled 2015-09-28 (×4): qty 1

## 2015-09-28 MED ORDER — LORAZEPAM 1 MG PO TABS
0.0000 mg | ORAL_TABLET | Freq: Two times a day (BID) | ORAL | Status: AC
  Administered 2015-09-30 – 2015-10-01 (×2): 1 mg via ORAL
  Filled 2015-09-28 (×2): qty 1

## 2015-09-28 MED ORDER — LORAZEPAM 1 MG PO TABS
1.0000 mg | ORAL_TABLET | Freq: Four times a day (QID) | ORAL | Status: AC | PRN
  Administered 2015-09-30: 1 mg via ORAL
  Filled 2015-09-28: qty 1

## 2015-09-28 MED ORDER — PIPERACILLIN-TAZOBACTAM 3.375 G IVPB
3.3750 g | Freq: Three times a day (TID) | INTRAVENOUS | Status: DC
  Administered 2015-09-29 – 2015-10-01 (×8): 3.375 g via INTRAVENOUS
  Filled 2015-09-28 (×8): qty 50

## 2015-09-28 MED ORDER — SODIUM CHLORIDE 0.9 % IV BOLUS (SEPSIS): 500.0000 mL | INTRAVENOUS | Status: DC

## 2015-09-28 MED ORDER — VITAMIN B-1 100 MG PO TABS
100.0000 mg | ORAL_TABLET | Freq: Every day | ORAL | Status: DC
  Administered 2015-09-28 – 2015-10-17 (×19): 100 mg via ORAL
  Filled 2015-09-28 (×19): qty 1

## 2015-09-28 MED ORDER — SODIUM CHLORIDE 0.9 % IV BOLUS (SEPSIS)
1000.0000 mL | Freq: Once | INTRAVENOUS | Status: AC
  Administered 2015-09-28: 1000 mL via INTRAVENOUS

## 2015-09-28 MED ORDER — SODIUM CHLORIDE 0.9 % IV SOLN
INTRAVENOUS | Status: DC
  Administered 2015-09-28 (×2): via INTRAVENOUS

## 2015-09-28 MED ORDER — ADULT MULTIVITAMIN W/MINERALS CH
1.0000 | ORAL_TABLET | Freq: Every day | ORAL | Status: DC
  Administered 2015-09-28 – 2015-10-17 (×20): 1 via ORAL
  Filled 2015-09-28 (×21): qty 1

## 2015-09-28 MED ORDER — MORPHINE SULFATE (PF) 2 MG/ML IV SOLN
0.5000 mg | Freq: Once | INTRAVENOUS | Status: AC
  Administered 2015-09-28: 0.5 mg via INTRAVENOUS
  Filled 2015-09-28: qty 1

## 2015-09-28 MED ORDER — SODIUM CHLORIDE 0.9 % IV BOLUS (SEPSIS)
1500.0000 mL | Freq: Once | INTRAVENOUS | Status: AC
  Administered 2015-09-28: 1500 mL via INTRAVENOUS

## 2015-09-28 MED ORDER — SODIUM CHLORIDE 0.9 % IV BOLUS (SEPSIS): 1000.0000 mL | INTRAVENOUS | Status: DC

## 2015-09-28 MED ORDER — SODIUM CHLORIDE 0.9% FLUSH
3.0000 mL | Freq: Two times a day (BID) | INTRAVENOUS | Status: DC
  Administered 2015-09-28 – 2015-10-17 (×21): 3 mL via INTRAVENOUS

## 2015-09-28 MED ORDER — FOLIC ACID 1 MG PO TABS
1.0000 mg | ORAL_TABLET | Freq: Every day | ORAL | Status: DC
  Administered 2015-09-28 – 2015-10-17 (×20): 1 mg via ORAL
  Filled 2015-09-28 (×19): qty 1

## 2015-09-28 MED ORDER — ALUM & MAG HYDROXIDE-SIMETH 200-200-20 MG/5ML PO SUSP: 30.0000 mL | Freq: Four times a day (QID) | ORAL | Status: DC | PRN

## 2015-09-28 MED ORDER — LORAZEPAM 2 MG/ML IJ SOLN: 1.0000 mg | Freq: Four times a day (QID) | INTRAMUSCULAR | Status: AC | PRN

## 2015-09-28 MED ORDER — FENTANYL CITRATE (PF) 100 MCG/2ML IJ SOLN
50.0000 ug | Freq: Once | INTRAMUSCULAR | Status: AC
  Administered 2015-09-28: 50 ug via INTRAVENOUS
  Filled 2015-09-28: qty 2

## 2015-09-28 MED ORDER — VANCOMYCIN HCL 10 G IV SOLR
1500.0000 mg | Freq: Once | INTRAVENOUS | Status: AC
  Administered 2015-09-28: 1500 mg via INTRAVENOUS
  Filled 2015-09-28: qty 1500

## 2015-09-28 MED ORDER — THIAMINE HCL 100 MG/ML IJ SOLN
100.0000 mg | Freq: Every day | INTRAMUSCULAR | Status: DC
  Administered 2015-10-05: 100 mg via INTRAVENOUS
  Filled 2015-09-28 (×3): qty 2

## 2015-09-28 MED ORDER — LORAZEPAM 1 MG PO TABS
0.0000 mg | ORAL_TABLET | Freq: Four times a day (QID) | ORAL | Status: AC
Start: 2015-09-28 — End: 2015-09-30
  Administered 2015-09-28: 2 mg via ORAL
  Administered 2015-09-29 (×2): 1 mg via ORAL
  Filled 2015-09-28: qty 2
  Filled 2015-09-28 (×2): qty 1

## 2015-09-28 MED ORDER — IPRATROPIUM-ALBUTEROL 0.5-2.5 (3) MG/3ML IN SOLN: 3.0000 mL | RESPIRATORY_TRACT | Status: DC | PRN

## 2015-09-28 MED ORDER — HEPARIN SODIUM (PORCINE) 5000 UNIT/ML IJ SOLN
5000.0000 [IU] | Freq: Three times a day (TID) | INTRAMUSCULAR | Status: DC
  Administered 2015-09-28 – 2015-10-11 (×37): 5000 [IU] via SUBCUTANEOUS
  Filled 2015-09-28 (×36): qty 1

## 2015-09-28 NOTE — ED Notes (Addendum)
2nd IV infiltrated. Verbal order from MD that 1 IV is ok. Will finish Vancomycin in other IV site when zosyn is completed.

## 2015-09-28 NOTE — ED Notes (Signed)
Patient transported to X-ray 

## 2015-09-28 NOTE — ED Notes (Signed)
Pt states her legs have been swollen for "a while now".  Also c/o abdominal pain with nausea.

## 2015-09-28 NOTE — ED Notes (Signed)
Pt eating meal tray. No needs voiced. Pt informed of the pending admission.

## 2015-09-28 NOTE — ED Provider Notes (Signed)
CSN: [id number]     Arrival date & time 09/28/15  1317 History   First MD Initiated Contact with Patient 09/28/15 1327     Chief Complaint  Patient presents with  . Leg Swelling     (Consider location/radiation/quality/duration/timing/severity/associated sxs/prior Treatment) The history is provided by the patient and the spouse.   a patient with admission on the end of February for alcohol hepatitis lactic acidosis dehydration. Patient states that she has not had any alcohol for 2 weeks. As the complaint of bilateral leg swelling. Was seen March 6 for that. Without significant findings. Had a rash on her legs than that was felt to be more like a dermatitis however was started on Keflex in case it was a cellulitis. Patient states that it is not gotten any better. Patient also with complaint of epigastric abdominal pain. Has had some nausea and vomiting but has been better controlled with the Phenergan. Denies any diarrhea. States that legs are swollen bilaterally and the rash on her legs is now on her arms. Patient denies any fevers.  Past Medical History  Diagnosis Date  . Gastric bypass status for obesity   . Pancreatitis   . Depression   . Alcohol abuse    Past Surgical History  Procedure Laterality Date  . Gastric bypass     History reviewed. No pertinent family history. Social History  Substance Use Topics  . Smoking status: Current Every Day Smoker -- 0.50 packs/day    Types: Cigarettes  . Smokeless tobacco: None  . Alcohol Use: Yes     Comment: daily   OB History    Gravida Para Term Preterm AB TAB SAB Ectopic Multiple Living   5 3  3 2 2          Review of Systems  Constitutional: Negative for fever.  HENT: Negative for congestion.   Eyes: Negative for redness.  Respiratory: Negative for shortness of breath.   Cardiovascular: Positive for leg swelling. Negative for chest pain.  Gastrointestinal: Positive for nausea, vomiting and abdominal pain.  Genitourinary:  Negative for dysuria.  Musculoskeletal: Negative for back pain.  Skin: Positive for rash and wound.  Neurological: Negative for headaches.  Hematological: Does not bruise/bleed easily.  Psychiatric/Behavioral: Negative for confusion.      Allergies  Aspirin  Home Medications   Prior to Admission medications   Medication Sig Start Date End Date Taking? Authorizing Provider  acetaminophen (TYLENOL) 500 MG tablet Take 1,000 mg by mouth every 6 (six) hours as needed for moderate pain.   Yes Historical Provider, MD  cephALEXin (KEFLEX) 500 MG capsule Take 1 capsule (500 mg total) by mouth 4 (four) times daily. 09/18/15  Yes Linwood Dibbles, MD  ibuprofen (ADVIL,MOTRIN) 200 MG tablet Take 600 mg by mouth every 6 (six) hours as needed for moderate pain.   Yes Historical Provider, MD  Multiple Vitamin (MULTIVITAMIN WITH MINERALS) TABS tablet Take 1 tablet by mouth daily. 10/18/14  Yes Lesle Chris Black, NP  omeprazole (PRILOSEC) 20 MG capsule Take 20 mg by mouth every morning.    Yes Historical Provider, MD  promethazine (PHENERGAN) 25 MG tablet Take 1 tablet (25 mg total) by mouth every 6 (six) hours as needed for nausea or vomiting. 09/18/15  Yes Linwood Dibbles, MD  diazepam (VALIUM) 5 MG tablet Take 1 tablet by mouth 3 times a day for one week, followed by 1 tablet by mouth twice a day for one week, followed by 1 tablet by mouth daily for 1  week then stop Quantity sufficient for taper Patient not taking: Reported on 09/18/2015 09/05/15   Jeralyn Bennett, MD  ondansetron (ZOFRAN ODT) 4 MG disintegrating tablet Take 1 tablet (4 mg total) by mouth every 8 (eight) hours as needed for nausea or vomiting. Patient not taking: Reported on 09/18/2015 09/05/15   Jeralyn Bennett, MD  oxyCODONE (OXY IR/ROXICODONE) 5 MG immediate release tablet Take 1 tablet (5 mg total) by mouth every 8 (eight) hours as needed for severe pain. Patient not taking: Reported on 09/18/2015 09/05/15   Jeralyn Bennett, MD   BP 90/54 mmHg  Pulse 88   Temp(Src) 97.9 F (36.6 C) (Oral)  Resp 15  SpO2 100%  LMP 08/29/2015 Physical Exam  Constitutional: She is oriented to person, place, and time. She appears well-developed and well-nourished.  HENT:  Head: Normocephalic and atraumatic.  Mucous membranes dry.  Neck: Normal range of motion. Neck supple.  Cardiovascular: Normal rate and regular rhythm.   Pulmonary/Chest: Effort normal and breath sounds normal. No respiratory distress.  Abdominal: Soft. Bowel sounds are normal. She exhibits no distension. There is no tenderness.  Musculoskeletal: Normal range of motion. She exhibits edema.  Swelling to both legs bilaterally. Nonblanching petechial versus cellulitis type rash. There is an area of open skin at the medial aspect of the left ankle. Cap refill is normal.  Neurological: She is alert and oriented to person, place, and time. No cranial nerve deficit. She exhibits normal muscle tone. Coordination normal.  Skin: Rash noted. There is erythema. There is pallor.  Nursing note and vitals reviewed.   ED Course  Procedures (including critical care time) Labs Review Labs Reviewed  COMPREHENSIVE METABOLIC PANEL - Abnormal; Notable for the following:    CO2 20 (*)    Glucose, Bld 124 (*)    Calcium 7.6 (*)    Total Protein 4.9 (*)    Albumin 1.8 (*)    AST 74 (*)    ALT 94 (*)    Alkaline Phosphatase 225 (*)    Total Bilirubin 6.0 (*)    All other components within normal limits  CBC WITH DIFFERENTIAL/PLATELET - Abnormal; Notable for the following:    RBC 2.88 (*)    Hemoglobin 9.3 (*)    HCT 28.3 (*)    RDW 18.2 (*)    Platelets 123 (*)    All other components within normal limits  PROTIME-INR - Abnormal; Notable for the following:    Prothrombin Time 17.0 (*)    All other components within normal limits  LACTIC ACID, PLASMA - Abnormal; Notable for the following:    Lactic Acid, Venous 4.3 (*)    All other components within normal limits  I-STAT CG4 LACTIC ACID, ED -  Abnormal; Notable for the following:    Lactic Acid, Venous 4.34 (*)    All other components within normal limits  CULTURE, BLOOD (ROUTINE X 2)  CULTURE, BLOOD (ROUTINE X 2)  URINE CULTURE  LIPASE, BLOOD  URINALYSIS, ROUTINE W REFLEX MICROSCOPIC (NOT AT [facility])  LACTIC ACID, PLASMA   Results for orders placed or performed during the hospital encounter of 09/28/15  Comprehensive metabolic panel  Result Value Ref Range   Sodium 137 135 - 145 mmol/L   Potassium 4.4 3.5 - 5.1 mmol/L   Chloride 108 101 - 111 mmol/L   CO2 20 (L) 22 - 32 mmol/L   Glucose, Bld 124 (H) 65 - 99 mg/dL   BUN 9 6 - 20 mg/dL   Creatinine, Ser 1.61  0.44 - 1.00 mg/dL   Calcium 7.6 (L) 8.9 - 10.3 mg/dL   Total Protein 4.9 (L) 6.5 - 8.1 g/dL   Albumin 1.8 (L) 3.5 - 5.0 g/dL   AST 74 (H) 15 - 41 U/L   ALT 94 (H) 14 - 54 U/L   Alkaline Phosphatase 225 (H) 38 - 126 U/L   Total Bilirubin 6.0 (H) 0.3 - 1.2 mg/dL   GFR calc non Af Amer >60 >60 mL/min   GFR calc Af Amer >60 >60 mL/min   Anion gap 9 5 - 15  Lipase, blood  Result Value Ref Range   Lipase 21 11 - 51 U/L  CBC with Differential/Platelet  Result Value Ref Range   WBC 7.8 4.0 - 10.5 K/uL   RBC 2.88 (L) 3.87 - 5.11 MIL/uL   Hemoglobin 9.3 (L) 12.0 - 15.0 g/dL   HCT 40.9 (L) 81.1 - 91.4 %   MCV 98.3 78.0 - 100.0 fL   MCH 32.3 26.0 - 34.0 pg   MCHC 32.9 30.0 - 36.0 g/dL   RDW 78.2 (H) 95.6 - 21.3 %   Platelets 123 (L) 150 - 400 K/uL   Neutrophils Relative % 75 %   Neutro Abs 5.9 1.7 - 7.7 K/uL   Lymphocytes Relative 13 %   Lymphs Abs 1.0 0.7 - 4.0 K/uL   Monocytes Relative 10 %   Monocytes Absolute 0.8 0.1 - 1.0 K/uL   Eosinophils Relative 1 %   Eosinophils Absolute 0.1 0.0 - 0.7 K/uL   Basophils Relative 1 %   Basophils Absolute 0.1 0.0 - 0.1 K/uL  Protime-INR  Result Value Ref Range   Prothrombin Time 17.0 (H) 11.6 - 15.2 seconds   INR 1.37 0.00 - 1.49  Lactic acid, plasma  Result Value Ref Range   Lactic Acid, Venous 4.3 (HH) 0.5 - 2.0  mmol/L  I-Stat CG4 Lactic Acid, ED  Result Value Ref Range   Lactic Acid, Venous 4.34 (HH) 0.5 - 2.0 mmol/L     Imaging Review Dg Chest 2 View  09/28/2015  CLINICAL DATA:  Bilateral leg swelling for a while. History of pancreatitis and smoking. EXAM: CHEST  2 VIEW COMPARISON:  Radiographs 09/01/2015 and 09/18/2015. FINDINGS: Stable right hemidiaphragm elevation. The heart size and mediastinal contours are stable with a recurrent moderate-sized hiatal hernia. Linear right perihilar atelectasis noted on the most recent examination has improved. No confluent airspace opacity, pleural effusion or pneumothorax. IMPRESSION: No acute cardiopulmonary process. Interval improved right perihilar aeration. Electronically Signed   By: Carey Bullocks M.D.   On: 09/28/2015 15:51   I have personally reviewed and evaluated these images and lab results as part of my medical decision-making.   EKG Interpretation   Date/Time:  Thursday September 28 2015 14:54:30 EDT Ventricular Rate:  88 PR Interval:  118 QRS Duration: 89 QT Interval:  385 QTC Calculation: 466 R Axis:   72 Text Interpretation:  Sinus rhythm Borderline short PR interval Low  voltage, precordial leads Confirmed by Janaysia Mcleroy  MD, Khaniyah Bezek ([id number]) on  09/28/2015 3:27:43 PM      CRITICAL CARE Performed by: Vanetta Mulders Total critical care time: 30 minutes Critical care time was exclusive of separately billable procedures and treating other patients. Critical care was necessary to treat or prevent imminent or life-threatening deterioration. Critical care was time spent personally by me on the following activities: development of treatment plan with patient and/or surrogate as well as nursing, discussions with consultants, evaluation of patient's response to treatment, examination  of patient, obtaining history from patient or surrogate, ordering and performing treatments and interventions, ordering and review of laboratory studies, ordering and  review of radiographic studies, pulse oximetry and re-evaluation of patient's condition.    MDM   Final diagnoses:  Alcoholic hepatitis without ascites  Lactic acidosis  Cellulitis of lower extremity, unspecified laterality    Patient presenting with a cellulitis that could be a dermatitis. Initially had done normal file signs did not have a fever was not tachycardic was not tachypnea. Blood pressures were systolic 110. Initiated treatment for cellulitis and perhaps an early sepsis picture but no Sears criteria. Blood pressure went on to drop down to his low is 84 systolic. With fluids it is slowly coming back up most recently it was 93 systolic. Never became tachycardic patient mentating fine. Not sure if this is really true a hypotensive sepsis or whether this is more related to her cirrhosis. However she was treated with broad-spectrum antibiotics. Patient having is stable for admission here. Chest x-ray was negative for pneumonia. Labs showed an anemia with a hemoglobin 9.3 no leukocytosis abnormal liver function test have persisted INR is elevated as well. Patient's first lactic acid was elevated at 4.3. Thought this could be due to just to volume depletion. Patient will receive based on the sepsis parameters when she got hypotensive 2.5 L of fluid. Between the first lactic acid in the second lactic acid she only had had 500 mL of fluid. Patient was cultured up. Discussed with hospitalist regarding admission.  The rash on both legs could be a dermatitis some components of it look like cellulitis. Also has a little bit of rash on her arms. Patient's platelet counts are low but not significantly low.    Vanetta MuldersScott Shreeya Recendiz, MD 09/28/15 [phone]

## 2015-09-28 NOTE — Progress Notes (Signed)
Pharmacy Antibiotic Note  Jennifer [name] is a 45 y.o. female admitted on 09/28/2015 with sepsis.  [facility]  Plan: Zosyn 3.375g IV q8h (4 hour infusion). Vancomycin 1500mg  x1 in ED then 1000mg  IV q12hrs Check trough at steady state Monitor labs, renal fxn, progress and c/s     Temp (24hrs), Avg:98.5 F (36.9 C), Min:98.5 F (36.9 C), Max:98.5 F (36.9 C)   Recent Labs Lab 09/28/15 1449 09/28/15 1451  WBC 7.8  --   CREATININE 0.97  --   LATICACIDVEN  --  4.34*    Estimated Creatinine Clearance: 80.5 mL/min (by C-G formula based on Cr of 0.97).    Allergies  Allergen Reactions  . Aspirin Other (See Comments)    Cant take due to gastric bypass    Anti-infectives    Start     Dose/Rate Route Frequency Ordered Stop   09/28/15 1615  vancomycin (VANCOCIN) 1,500 mg in sodium chloride 0.9 % 500 mL IVPB     1,500 mg 250 mL/hr over 120 Minutes Intravenous  Once 09/28/15 1533     09/28/15 1530  piperacillin-tazobactam (ZOSYN) IVPB 3.375 g     3.375 g 100 mL/hr over 30 Minutes Intravenous  Once 09/28/15 1526     09/28/15 1530  vancomycin (VANCOCIN) IVPB 1000 mg/200 mL premix  Status:  Discontinued     1,000 mg 200 mL/hr over 60 Minutes Intravenous  Once 09/28/15 1526 09/28/15 1533     Results for orders placed or performed during the hospital encounter of 06/09/10  MRSA PCR Screening     Status: None   Collection Time: 06/09/10  2:58 AM  Result Value Ref Range Status   MRSA by PCR  NEGATIVE Final    NEGATIVE        The GeneXpert MRSA Assay (FDA approved for NASAL specimens only), is one component of a comprehensive MRSA colonization surveillance program. It is not intended to diagnose MRSA infection nor to guide or monitor treatment for MRSA infections.   Thank you for allowing pharmacy to be a part of this patient's care.  Valrie HartHall, Duyen Beckom A 09/28/2015 3:48 PM

## 2015-09-28 NOTE — H&P (Signed)
PCP:   [facility]   Chief Complaint:  Rash, leg pain  HPI: this is a 45 y/o female with h/o alcohol abuse, tobacco abuse and recent pancreatitis. She has chronic LE edema. She was in the Er 09/18/15 with c/o LE rash and UTI. She was d/c on Keflex which she has been taking. Two days ago she developed worsening rash on legs, splotchy redness, itching, blistering on the legs. She states it hurt to walk on them she c/o pins and needle sensation on her feet. Today she developed a mild similar rash on her wrists. She is unclear if she has had any fevers but reports chills. She denies nausea or vomiting. In the ER the patient was transiently hypotensive with a systolic blood pressure down to 84. She received a liter NS bolus and her blood pressure rebounded. total fluid given in ER 2 L   Review of Systems:  The patient denies anorexia, fever, weight loss,, vision loss, decreased hearing, hoarseness, chest pain, syncope, dyspnea on exertion, peripheral edema, balance deficits, hemoptysis, abdominal pain, melena, hematochezia, severe indigestion/heartburn, hematuria, incontinence, genital sores, muscle weakness, suspicious skin lesions, transient blindness, difficulty walking, depression, unusual weight change, abnormal bleeding, enlarged lymph nodes, angioedema, and breast masses.  Past Medical History: Past Medical History  Diagnosis Date  . Gastric bypass status for obesity   . Pancreatitis   . Depression   . Alcohol abuse    Past Surgical History  Procedure Laterality Date  . Gastric bypass      Medications: Prior to Admission medications   Medication Sig Start Date End Date Taking? Authorizing Provider  acetaminophen (TYLENOL) 500 MG tablet Take 1,000 mg by mouth every 6 (six) hours as needed for moderate pain.   Yes Historical Provider, MD  cephALEXin (KEFLEX) 500 MG capsule Take 1 capsule (500 mg total) by mouth 4 (four) times daily. 09/18/15  Yes Linwood Dibbles, MD  ibuprofen  (ADVIL,MOTRIN) 200 MG tablet Take 600 mg by mouth every 6 (six) hours as needed for moderate pain.   Yes Historical Provider, MD  Multiple Vitamin (MULTIVITAMIN WITH MINERALS) TABS tablet Take 1 tablet by mouth daily. 10/18/14  Yes Lesle Chris Black, NP  omeprazole (PRILOSEC) 20 MG capsule Take 20 mg by mouth every morning.    Yes Historical Provider, MD  promethazine (PHENERGAN) 25 MG tablet Take 1 tablet (25 mg total) by mouth every 6 (six) hours as needed for nausea or vomiting. 09/18/15  Yes Linwood Dibbles, MD  diazepam (VALIUM) 5 MG tablet Take 1 tablet by mouth 3 times a day for one week, followed by 1 tablet by mouth twice a day for one week, followed by 1 tablet by mouth daily for 1 week then stop Quantity sufficient for taper Patient not taking: Reported on 09/18/2015 09/05/15   Jeralyn Bennett, MD  ondansetron (ZOFRAN ODT) 4 MG disintegrating tablet Take 1 tablet (4 mg total) by mouth every 8 (eight) hours as needed for nausea or vomiting. Patient not taking: Reported on 09/18/2015 09/05/15   Jeralyn Bennett, MD  oxyCODONE (OXY IR/ROXICODONE) 5 MG immediate release tablet Take 1 tablet (5 mg total) by mouth every 8 (eight) hours as needed for severe pain. Patient not taking: Reported on 09/18/2015 09/05/15   Jeralyn Bennett, MD    Allergies:   Allergies  Allergen Reactions  . Aspirin Other (See Comments)    Cant take due to gastric bypass     Social History:  reports that she has been smoking Cigarettes.  She has been smoking about 0.50 packs per day. She does not have any smokeless tobacco history on file. She reports that she drinks alcohol. She reports that she does not use illicit drugs.  Family History: CAD,   Physical Exam: Filed Vitals:   09/28/15 1830 09/28/15 1900 09/28/15 2000 09/28/15 2002  BP: 90/54 91/47 99/63    Pulse: 88 86    Temp:      TempSrc:      Resp: 15 18    SpO2: 100% 100%  100%    General:  Alert and oriented times three, well developed and nourished, no acute  distress Eyes: PERRLA, pink conjunctiva, no scleral icterus ENT: Moist oral mucosa, neck supple, no thyromegaly Lungs: clear to ascultation, no wheeze, no crackles, no use of accessory muscles Cardiovascular: regular rate and rhythm, no regurgitation, no gallops, no murmurs. No carotid bruits, no JVD Abdomen: soft, positive BS, non-tender, non-distended, no organomegaly, not an acute abdomen GU: not examined Neuro: CN II - XII grossly intact, sensation intact Musculoskeletal: strength 5/5 all extremities, no clubbing, cyanosis or 3+ edema b/L LE, cellulitic rash feet, legs and inner thigh Skin: no rash, no subcutaneous crepitation, no decubitus Psych: appropriate patient   Labs on Admission:   Recent Labs  09/28/15 1449  NA 137  K 4.4  CL 108  CO2 20*  GLUCOSE 124*  BUN 9  CREATININE 0.97  CALCIUM 7.6*    Recent Labs  09/28/15 1449  AST 74*  ALT 94*  ALKPHOS 225*  BILITOT 6.0*  PROT 4.9*  ALBUMIN 1.8*    Recent Labs  09/28/15 1449  LIPASE 21    Recent Labs  09/28/15 1449  WBC 7.8  NEUTROABS 5.9  HGB 9.3*  HCT 28.3*  MCV 98.3  PLT 123*   No results for input(s): CKTOTAL, CKMB, CKMBINDEX, TROPONINI in the last 72 hours. Invalid input(s): POCBNP No results for input(s): DDIMER in the last 72 hours. No results for input(s): HGBA1C in the last 72 hours. No results for input(s): CHOL, HDL, LDLCALC, TRIG, CHOLHDL, LDLDIRECT in the last 72 hours. No results for input(s): TSH, T4TOTAL, T3FREE, THYROIDAB in the last 72 hours.  Invalid input(s): FREET3 No results for input(s): VITAMINB12, FOLATE, FERRITIN, TIBC, IRON, RETICCTPCT in the last 72 hours.  Micro Results: No results found for this or any previous visit (from the past 240 hour(s)).  Results for [name], Jennifer L (MRN [id number]) as of 09/28/2015 21:34  Ref. Range 09/28/2015 20:44  Appearance Latest Ref Range: CLEAR  HAZY (A)  Bacteria, UA Latest Ref Range: NONE SEEN  FEW (A)  Bilirubin Urine Latest  Ref Range: NEGATIVE  LARGE (A)  Casts Latest Ref Range: NEGATIVE  GRANULAR CAST (A)  Color, Urine Latest Ref Range: YELLOW  AMBER (A)  Glucose Latest Ref Range: NEGATIVE mg/dL 469100 (A)  Hgb urine dipstick Latest Ref Range: NEGATIVE  NEGATIVE  Ketones, ur Latest Ref Range: NEGATIVE mg/dL TRACE (A)  Leukocytes, UA Latest Ref Range: NEGATIVE  TRACE (A)  Nitrite Latest Ref Range: NEGATIVE  POSITIVE (A)  pH Latest Ref Range: 5.0-8.0  6.5  Protein Latest Ref Range: NEGATIVE mg/dL 30 (A)  RBC / HPF Latest Ref Range: 0-5 RBC/hpf NONE SEEN  Specific Gravity, Urine Latest Ref Range: 1.005-1.030  1.025  Squamous Epithelial / LPF Latest Ref Range: NONE SEEN  0-5 (A)  Urine-Other Unknown AMORPHOUS URATES/...  WBC, UA Latest Ref Range: 0-5 WBC/hpf 0-5     Radiological Exams on Admission: Dg Chest 2  View  09/28/2015  CLINICAL DATA:  Bilateral leg swelling for a while. History of pancreatitis and smoking. EXAM: CHEST  2 VIEW COMPARISON:  Radiographs 09/01/2015 and 09/18/2015. FINDINGS: Stable right hemidiaphragm elevation. The heart size and mediastinal contours are stable with a recurrent moderate-sized hiatal hernia. Linear right perihilar atelectasis noted on the most recent examination has improved. No confluent airspace opacity, pleural effusion or pneumothorax. IMPRESSION: No acute cardiopulmonary process. Interval improved right perihilar aeration. Electronically Signed   By: Carey Bullocks M.D.   On: 09/28/2015 15:51    Assessment/Plan Present on Admission:  . SIRS (systemic inflammatory response syndrome) (HCC)/hypotension -admit to Medsurg -blood culture and urine cultures ordered -antibiotics zosyn and vancomycin ordered, pharmacy to dose -NS, fluid resuscitation . Extensive B/L LE cellulitis -antibiotics ordered. This may also be a drug reaction Previous UTI -patient was on antibiotics outpatient and received zosyn in ER hours prior to UA being collected. UA collected remain positive for  nitrates. Patient may have urinary sepsis even if cultures are negative. Continue broad spectrum antibiotics. . Tobacco use disorder -nicotine patch not ordered  -duonebs as needed . Alcohol dependence (HCC) -CIWA protocol -drinks one pint vodka daily. She states her last drink was 5 days ago. . Transaminitis -CMP ordered for AM, monitor H/o alcoholic Pancreatitis -resolved, stable . Depression -   Serenah Mill 09/28/2015, 9:13 PM

## 2015-09-29 ENCOUNTER — Inpatient Hospital Stay (HOSPITAL_COMMUNITY): Payer: Medicaid Other

## 2015-09-29 DIAGNOSIS — R609 Edema, unspecified: Secondary | ICD-10-CM

## 2015-09-29 DIAGNOSIS — L03115 Cellulitis of right lower limb: Secondary | ICD-10-CM

## 2015-09-29 DIAGNOSIS — A419 Sepsis, unspecified organism: Principal | ICD-10-CM

## 2015-09-29 DIAGNOSIS — F10230 Alcohol dependence with withdrawal, uncomplicated: Secondary | ICD-10-CM

## 2015-09-29 LAB — CBC
HEMATOCRIT: 25.2 % — AB (ref 36.0–46.0)
HEMOGLOBIN: 8.2 g/dL — AB (ref 12.0–15.0)
MCH: 32.8 pg (ref 26.0–34.0)
MCHC: 32.5 g/dL (ref 30.0–36.0)
MCV: 100.8 fL — ABNORMAL HIGH (ref 78.0–100.0)
Platelets: 142 10*3/uL — ABNORMAL LOW (ref 150–400)
RBC: 2.5 MIL/uL — ABNORMAL LOW (ref 3.87–5.11)
RDW: 18.3 % — ABNORMAL HIGH (ref 11.5–15.5)
WBC: 7.3 10*3/uL (ref 4.0–10.5)

## 2015-09-29 LAB — COMPREHENSIVE METABOLIC PANEL
ALBUMIN: 1.5 g/dL — AB (ref 3.5–5.0)
ALT: 76 U/L — ABNORMAL HIGH (ref 14–54)
ANION GAP: 10 (ref 5–15)
AST: 68 U/L — ABNORMAL HIGH (ref 15–41)
Alkaline Phosphatase: 182 U/L — ABNORMAL HIGH (ref 38–126)
BILIRUBIN TOTAL: 5.1 mg/dL — AB (ref 0.3–1.2)
BUN: 9 mg/dL (ref 6–20)
CO2: 20 mmol/L — ABNORMAL LOW (ref 22–32)
Calcium: 7.3 mg/dL — ABNORMAL LOW (ref 8.9–10.3)
Chloride: 109 mmol/L (ref 101–111)
Creatinine, Ser: 0.9 mg/dL (ref 0.44–1.00)
GFR calc Af Amer: >60 mL/min (ref >60)
GFR calc non Af Amer: >60 mL/min (ref >60)
GLUCOSE: 113 mg/dL — AB (ref 65–99)
POTASSIUM: 4.1 mmol/L (ref 3.5–5.1)
Sodium: 139 mmol/L (ref 135–145)
TOTAL PROTEIN: 4.3 g/dL — AB (ref 6.5–8.1)

## 2015-09-29 LAB — LACTIC ACID, PLASMA
LACTIC ACID, VENOUS: 4.5 mmol/L — AB (ref 0.5–2.0)
LACTIC ACID, VENOUS: 3.8 mmol/L — AB (ref 0.5–2.0)

## 2015-09-29 LAB — PROTIME-INR
INR: 1.47 (ref 0.00–1.49)
Prothrombin Time: 17.9 seconds — ABNORMAL HIGH (ref 11.6–15.2)

## 2015-09-29 MED ORDER — OXYCODONE HCL 5 MG PO TABS
5.0000 mg | ORAL_TABLET | Freq: Three times a day (TID) | ORAL | Status: DC | PRN
  Administered 2015-09-29 – 2015-10-02 (×7): 5 mg via ORAL
  Filled 2015-09-29 (×7): qty 1

## 2015-09-29 MED ORDER — SODIUM CHLORIDE 0.9 % IV BOLUS (SEPSIS)
1000.0000 mL | Freq: Once | INTRAVENOUS | Status: AC
  Administered 2015-09-29: 1000 mL via INTRAVENOUS

## 2015-09-29 MED ORDER — SODIUM CHLORIDE 0.9% FLUSH
10.0000 mL | INTRAVENOUS | Status: DC | PRN
  Administered 2015-10-10 – 2015-10-11 (×2): 20 mL
  Administered 2015-10-11: 10 mL
  Filled 2015-09-29 (×3): qty 40

## 2015-09-29 MED ORDER — SODIUM CHLORIDE 0.9% FLUSH
10.0000 mL | Freq: Two times a day (BID) | INTRAVENOUS | Status: DC
  Administered 2015-09-30: 10 mL
  Administered 2015-09-30: 20 mL
  Administered 2015-10-01 (×2): 10 mL
  Administered 2015-10-02: 30 mL
  Administered 2015-10-02 – 2015-10-10 (×15): 10 mL
  Administered 2015-10-11: 20 mL
  Administered 2015-10-12 (×2): 10 mL
  Administered 2015-10-13: 20 mL
  Administered 2015-10-14 – 2015-10-16 (×4): 10 mL

## 2015-09-29 NOTE — Progress Notes (Signed)
Skin assessment completed this shift. Rash noted to bilateral legs, thighs, buttocks, arms, & hands/wrist regions. Cellulitis noted to bilateral lower legs w/+3 edema. Several blisters noted to LEFT lower leg, RIGHT lower leg, RIGHT inner thigh, & RIGHT buttocks some intact & some open. Scratch marks noted to RIGHT thigh/buttocks open & bleeding. Scratch marks with intact scabs noted to upper back/neck. Open area to LEFT ankle bleeding. Total bed bath given to patient, open areas cleansed and cover with foam drsgs. Bilateral LE cleansed & covered with non-adherent drsgs & wrapped with kerlix d/t some weeping & bleeding. Patient reported feeling better afterwards.

## 2015-09-29 NOTE — Progress Notes (Signed)
CRITICAL VALUE ALERT  Critical value received:  Lactic acid 4.5  Date of notification: 09/29/15  Time of notification:  1343  Critical value read back:yes  Nurse who received alert: Koleen Nimrod Katiria Calame RN  MD notified (1st page):  Irene LimboGoodrich  Time of first page:  1343  MD notified (2nd page):NA  Time of second page:NA  Responding MD:  Irene LimboGoodrich  Time MD responded:  [phone]

## 2015-09-29 NOTE — Progress Notes (Addendum)
PROGRESS NOTE  Jennifer [name] ZOX:[id number] DOB: 1970-09-11 DOA: 09/28/2015 PCP: [facility]  Summary: 45 year old woman with history of alcohol abuse, recent pancreatitis, chronic lower extremity edema presented with progressive rash of the lower extremities and now bilateral wrists as well as edema both legs. Recently treated with Keflex. Transiently hypotensive in the emergency department. Admitted for bilateral lower summary cellulitis, possible early sepsis  Assessment/Plan: 1. Bilateral lower extremity cellulitis, afebrile with stable hemodynamics and no leukocytosis. Early sepsis considered on admission with elevated lactic acid and hypotension. Lactic acid has been relatively flat although is somewhat improved with volume resuscitation. She is awake and alert and mentating normally. 2. Suspected Cirrhosis secondary to severe diffuse hepatic steatosis on CT 08/2015. Suspect lower extremity edema secondary to cirrhosis. 3. Elevated LFTs, persistent, presumed secondary to alcoholic hepatitis. LFTs improved today. Drinks a pint of vodka per day. 4. Normocytic anemia, thrombocytopenia. Suspect secondary to alcohol use. INR normal 1.37. 5. Alcohol dependence. Drinks 1 pint of vodka daily. 6. 08/2015 admitted for alcoholic hepatitis.  7. PMH gastric bypass, pancreatitis 8. Depression  9. Tobacco use disorder   She appears stable on examination and I wonder if her elevated lactate has something to do with ethanol ingestion. She does not appear septic and lactic acid is somewhat improved.  We will continue to trend lactic acid, administer further IV fluids, continue empiric antibiotics.  Consult wound care.  Check BMP and CBC in the morning.  Check urine drug screen   Check echo   Strict input/output  ADDENDUM 1900 Lactic acid now trending down after bolus. Patient awake, alert, appears calm and comfortable   Code Status: Full DVT prophylaxis: Heparin Family  Communication: No family present at bedside Disposition Plan: Discharge once improved  Brendia Sacks, MD  [facility]  Pager [phone] If 7PM-7AM, please contact night-coverage at [email or web address], password [facility] 09/29/2015, 10:51 AM  LOS: 1 day   Consultants:  none  Procedures:  none  Antibiotics:  Zosyn 3/16>>  Vanc 3/16 >>  HPI/Subjective: Feels sore today. Denies vomiting, but was dry heaving. Is experiencing stomach pain and has eaten. Experiencing swelling and weakness that onset 4 days ago. Edema and rash onset the last few days. Rash on wrist onset this morning. Denies starting any new medication recently. Has not had any previous skin conditions.  Becomes tired easily after walking. Has not had any previous issues with LE swelling. She reports a surgery in 2007. Reports that her last drink has been about a week ago.   Objective: Filed Vitals:   09/28/15 2153 09/29/15 0205 09/29/15 0800 09/29/15 1042  BP: 109/70 105/88 112/76 87/43  Pulse: 111 84 87 102  Temp: 98.1 F (36.7 C) 98 F (36.7 C) 98.3 F (36.8 C) 98.3 F (36.8 C)  TempSrc: Oral Oral Oral Oral  Resp: Height:  (1.727 m)     Weight: 83.915 kg (185 lb)     SpO2: 100% 100% 100% 100%    Intake/Output Summary (Last 24 hours) at 09/29/15 1051 Last data filed at 09/29/15 0900  Gross per 24 hour  Intake   1740 ml  Output      0 ml  Net   1740 ml     Filed Weights   09/28/15 2153  Weight: 83.915 kg (185 lb)    Exam:    General:  Appears calm and comfortable Eyes: PERRL, normal lids, irises & conjunctiva  ENT: grossly normal hearing, lips & tongue  Neck: no LAD, masses or thyromegaly Cardiovascular: RRR, no m/r/g. 2+ BLE edema.* Telemetry: SR, no arrhythmias  Respiratory: CTA bilaterally, no w/r/r. Normal respiratory effort.  Abdomen: soft, ntnd Skin:  BLE erythema below the knees. No abscess noted. Feet are edematous, but no rash. Musculoskeletal: grossly normal tone  BUE/BLE Psychiatric: grossly normal mood and affect, speech fluent and appropriate Neurologic: grossly non-focal.   New data reviewed:  Albumin 1.5  AST 68, no significant change  ALT 76, no significant change  Total bili 5.1, no significant change  WBC 7.3  Protime 17.9  INR 1.47  BMP unremarkable  Hgb 8.2  Platelet 142  Pertinent data since admission:  none  Pending data:  Blood and urine Cx  Scheduled Meds: . folic acid  1 mg Oral Daily  . heparin  5,000 Units Subcutaneous 3 times per day  . LORazepam  0-4 mg Oral Q6H   Followed by  . [START ON 09/30/2015] LORazepam  0-4 mg Oral Q12H  . multivitamin with minerals  1 tablet Oral Daily  . piperacillin-tazobactam (ZOSYN)  IV  3.375 g Intravenous Q8H  . sodium chloride flush  3 mL Intravenous Q12H  . thiamine  100 mg Oral Daily   Or  . thiamine  100 mg Intravenous Daily  . vancomycin  1,000 mg Intravenous Q12H   Continuous Infusions: . sodium chloride 75 mL/hr at 09/28/15 2139    Active Problems:   Alcohol dependence (HCC)   Depression   Tobacco use disorder   SIRS (systemic inflammatory response syndrome) (HCC)   Hypotension   Cellulitis   Time spent 35 minutes     By signing my name below, I, Adron BeneGreylon Gawaluck, attest that this documentation has been prepared under the direction and in the presence of Daniel P. Irene LimboGoodrich, MD. Electronically Signed: Adron BeneGreylon Gawaluck, Scribe.  09/29/2015  10:50am  I personally performed the services described in this documentation. All medical record entries made by the scribe were at my direction. I have reviewed the chart and agree that the record reflects my personal performance and is accurate and complete. Brendia Sacksaniel Goodrich, MD

## 2015-09-29 NOTE — Care Management Note (Signed)
Case Management Note  Patient Details  Name: Albesa Seenarry L Bartus MRN: [id number] Date of Birth: 1970/09/12  Subjective/Objective:                  Pt admitted with sepsis. Pt is very ill and unable to complete assessment at this time. Pt is from home and [facility]   Action/Plan: Anticipate DC home. ? HH services. Will cont to follow.   Expected Discharge Date:  10/02/15               Expected Discharge Plan:  Home w Home Health Services  In-House Referral:  NA  Discharge planning Services  CM Consult  Post Acute Care Choice:    Choice offered to:     DME Arranged:    DME Agency:     HH Arranged:    HH Agency:     Status of Service:  In process, will continue to follow  Medicare Important Message Given:    Date Medicare IM Given:    Medicare IM give by:    Date Additional Medicare IM Given:    Additional Medicare Important Message give by:     If discussed at Long Length of Stay Meetings, dates discussed:    Additional Comments:  Malcolm MetroChildress, [place], RN 09/29/2015, 3:19 PM

## 2015-09-30 ENCOUNTER — Encounter (HOSPITAL_COMMUNITY): Payer: Self-pay | Admitting: Gastroenterology

## 2015-09-30 DIAGNOSIS — L03119 Cellulitis of unspecified part of limb: Secondary | ICD-10-CM

## 2015-09-30 DIAGNOSIS — F1020 Alcohol dependence, uncomplicated: Secondary | ICD-10-CM

## 2015-09-30 DIAGNOSIS — D62 Acute posthemorrhagic anemia: Secondary | ICD-10-CM

## 2015-09-30 DIAGNOSIS — K7010 Alcoholic hepatitis without ascites: Secondary | ICD-10-CM

## 2015-09-30 DIAGNOSIS — D696 Thrombocytopenia, unspecified: Secondary | ICD-10-CM | POA: Diagnosis present

## 2015-09-30 HISTORY — DX: Acute posthemorrhagic anemia: D62

## 2015-09-30 LAB — CBC
HCT: 22.2 % — ABNORMAL LOW (ref 36.0–46.0)
Hemoglobin: 7.5 g/dL — ABNORMAL LOW (ref 12.0–15.0)
MCH: 33 pg (ref 26.0–34.0)
MCHC: 33.8 g/dL (ref 30.0–36.0)
MCV: 97.8 fL (ref 78.0–100.0)
PLATELETS: 129 10*3/uL — AB (ref 150–400)
RBC: 2.27 MIL/uL — ABNORMAL LOW (ref 3.87–5.11)
RDW: 18.2 % — AB (ref 11.5–15.5)
WBC: 5.9 10*3/uL (ref 4.0–10.5)

## 2015-09-30 LAB — COMPREHENSIVE METABOLIC PANEL
ALK PHOS: 167 U/L — AB (ref 38–126)
ALT: 60 U/L — AB (ref 14–54)
AST: 60 U/L — AB (ref 15–41)
Albumin: 1.4 g/dL — ABNORMAL LOW (ref 3.5–5.0)
Anion gap: 6 (ref 5–15)
BILIRUBIN TOTAL: 4.6 mg/dL — AB (ref 0.3–1.2)
BUN: 6 mg/dL (ref 6–20)
CO2: 21 mmol/L — ABNORMAL LOW (ref 22–32)
CREATININE: 0.64 mg/dL (ref 0.44–1.00)
Calcium: 6.9 mg/dL — ABNORMAL LOW (ref 8.9–10.3)
Chloride: 111 mmol/L (ref 101–111)
GFR calc Af Amer: >60 mL/min (ref >60)
Glucose, Bld: 93 mg/dL (ref 65–99)
Potassium: 3.5 mmol/L (ref 3.5–5.1)
Sodium: 138 mmol/L (ref 135–145)
TOTAL PROTEIN: 4 g/dL — AB (ref 6.5–8.1)

## 2015-09-30 LAB — URINE CULTURE: Culture: NO GROWTH

## 2015-09-30 LAB — GLUCOSE, CAPILLARY: Glucose-Capillary: 98 mg/dL (ref 65–99)

## 2015-09-30 LAB — HIV ANTIBODY (ROUTINE TESTING W REFLEX): HIV Screen 4th Generation wRfx: NONREACTIVE

## 2015-09-30 MED ORDER — PANTOPRAZOLE SODIUM 40 MG PO TBEC
40.0000 mg | DELAYED_RELEASE_TABLET | Freq: Every day | ORAL | Status: DC
  Administered 2015-09-30 – 2015-10-11 (×12): 40 mg via ORAL
  Filled 2015-09-30 (×12): qty 1

## 2015-09-30 NOTE — Progress Notes (Addendum)
PROGRESS NOTE  ALITHIA ZAVALETA ZOX:[id number] DOB: 1970-08-02 DOA: 09/28/2015 PCP: [facility]  Summary: 45 year old woman with history of alcohol abuse, recent pancreatitis, chronic lower extremity edema presented with progressive rash of the lower extremities and now bilateral wrists as well as edema both legs. Recently treated with Keflex. Transiently hypotensive in the emergency department. Admitted for bilateral lower summary cellulitis, possible early sepsis  Assessment/Plan: 1. Bilateral lower extremity cellulitis, afebrile, no leukocytosis. Early sepsis considered on admission with elevated lactic acid and hypotension. Lactic acid now trending down. Does not clinically appear to have sepsis and suspect elevated lactic acid reflective more of dehydration, malnutrition, poor oral intake.  2. Suspected cirrhosis secondary to severe diffuse hepatic steatosis on CT 08/2015 and heavy alcohol use. Suspect lower extremity edema secondary to cirrhosis. Echocardiogram pending. 3. Elevated LFTs, persistent, secondary to alcoholic hepatitis. LFTs trending down. Drinks a pint of vodka per day. 4. Anasarca, follow-up echocardiogram but suspect related to cirrhosis and hypoalbuminemia. Volume management will be difficult with low normal blood pressure. Blood pressure improves will try albumin and Lasix. 5. Normocytic anemia, thrombocytopenia. Suspect secondary to alcohol use. INR normal 1.47. Anemia slightly worse today. No evidence of bleeding. 6. Alcohol dependence. Drinks 1 pint of vodka daily. No evidence of withdrawal. 7. 08/2015 admitted for alcoholic hepatitis.  8. PMH gastric bypass, pancreatitis 9. Depression  10. Tobacco use disorder. Counseled on cessation.    Clinically slightly improved in regard to cellulitis. Low blood pressure is likely reflective of cirrhosis and there is no clinical evidence to suggest sepsis at this point. Her prognosis remains guarded given cirrhosis and  multiple associated abnormalities.  Continue empiric antibiotics.  Follow CBC and CMP. Will ask GI opinion in regard to anemia as well as cirrhosis care. Patient opened a transfusion if recommended.  Continue to monitor for withdrawal.  Follow up ECHO  Code Status: Full DVT prophylaxis: Heparin Family Communication: No family present at bedside Disposition Plan: Discharge once improved  Brendia Sacks, MD  [facility]  Pager [phone] If 7PM-7AM, please contact night-coverage at [email or web address], password [facility] 09/30/2015, 9:24 AM  LOS: 2 days   Consultants:  none  Procedures:    Antibiotics:  Zosyn 3/16>>  Vanc 3/16 >>  HPI/Subjective: Moderate pain in legs. Is breathing fine but has not been moving much. Denies nausea. Tolerating diet.  Objective: Filed Vitals:   09/29/15 1200 09/29/15 2200 09/30/15 0200 09/30/15 0600  BP: 96/58 100/59 90/47 93/44   Pulse: 110 89 95 100  Temp:  97.8 F (36.6 C) 98.6 F (37 C) 98.1 F (36.7 C)  TempSrc:  Oral Oral Oral  Resp: Height:      Weight:      SpO2: 100% 100% 100% 100%    Intake/Output Summary (Last 24 hours) at 09/30/15 0924 Last data filed at 09/30/15 0801  Gross per 24 hour  Intake 2106.25 ml  Output    950 ml  Net 1156.25 ml     Filed Weights   09/28/15 2153  Weight: 83.915 kg (185 lb)    Exam:    General:  Appears ill but not toxic. Appears chronically ill. Eyes: PERRL, normal lids, irises  ENT: grossly normal hearing, lips Cardiovascular: RRR, no m/r/g. 3+ BLE edema. Respiratory: CTA bilaterally, no w/r/r. Normal respiratory effort.  Abdomen: soft, ntnd. Positive bowel sounds.  Skin: Few excoriations on wrist. Still 3+ pitting edema up to thighs.   BLE erythema below the knees less intense, still  warm. No abscess noted. Feet are edematous. Musculoskeletal: grossly normal tone BUE/BLE Psychiatric: grossly normal mood and affect, speech fluent and appropriate Neurologic: grossly  non-focal.   New data reviewed:  I/O do not include multiple voids  BMP unremarkable  Hgb 7.5, lower today  Platelet 129, slightly lower  LFTs trending down   Pending data:  Blood and urine Cx  Scheduled Meds: . folic acid  1 mg Oral Daily  . heparin  5,000 Units Subcutaneous 3 times per day  . LORazepam  0-4 mg Oral Q6H   Followed by  . LORazepam  0-4 mg Oral Q12H  . multivitamin with minerals  1 tablet Oral Daily  . pantoprazole  40 mg Oral QAC breakfast  . piperacillin-tazobactam (ZOSYN)  IV  3.375 g Intravenous Q8H  . sodium chloride flush  10-40 mL Intracatheter Q12H  . sodium chloride flush  3 mL Intravenous Q12H  . thiamine  100 mg Oral Daily   Or  . thiamine  100 mg Intravenous Daily  . vancomycin  1,000 mg Intravenous Q12H   Continuous Infusions: . sodium chloride 75 mL/hr at 09/29/15 1242    Principal Problem:   Lower extremity cellulitis Active Problems:   Alcohol dependence (HCC)   Depression   Gastric bypass status for obesity   Elevated LFTs   Tobacco use disorder   Alcoholic hepatitis   Hypotension   Anemia   Thrombocytopenia (HCC)   Time spent 25 minutes     By signing my name below, I, Zadie CleverlyJessica Augustus attest that this documentation has been prepared under the direction and in the presence of Brendia Sacksaniel Truc Winfree, MD Electronically signed: Zadie CleverlyJessica Augustus  09/30/2015 10:53am  I personally performed the services described in this documentation. All medical record entries made by the scribe were at my direction. I have reviewed the chart and agree that the record reflects my personal performance and is accurate and complete. Brendia Sacksaniel Heman Que, MD

## 2015-09-30 NOTE — Consult Note (Addendum)
Referring Provider: No ref. provider found Primary Care Physician:  Miguel [facility] Primary Gastroenterologist:  Jonette Eva  Reason for Consultation:  ANEMIA, ETOH HEPATITIS   Impression: ADMITTED WITH LOWER EXTREMITY CELLULITIS & DECOMPENSATED LIVER DISEASE IN SETTING OF ETOH HEPATITIS/SEPSIS. IMAGING DOES NOT SUGGEST CIRRHOSIS & PLT CT WAS NORMAL IN FEB 2017.  Plan: 1. SUPPORTIVE CARE 2. MONITOR FOR DEVELOPING SYMPTOMS: MELENA/BRBPR. PLAN EGD/TCS AS AN OUTPATIENT UNLESS PT DEVELOPS ACTIVE BLEEDING, OR A TRANSFUSION DEPENDENT ANEMIA. 3. PROTONIX DAILY 4. ADVANCED BARIATRIC DIET 5. PT NOT A CANDIDATE FOR STEROIDS DUE TO ONGOING SEPSIS.  GREATER THAN 50% WAS SPENT IN COUNSELING & COORDINATION OF CARE WITH THE PATIENT: REVIEWED RECORDS FROM 2011 TO PRESENT. DISCUSSED DIFFERENTIAL DIAGNOSIS, BENEFITS, RISKS OF INFECTION/DEATH, AND MANAGEMENT OF ETOH HEPATITIS/DECOMPENSATED LIVER DISEASE. TOTAL ENCOUNTER TIME: 80 MINS.     HPI:  OCCASIONAL DARK STOOLS A COUPLE OF TIMES. ORIGINALLY FROM [place] RARE BRBPR < 1-2X/MO. HAS NAUSEA OFF AND ON. NAUSEA MORE THAN VOMITING. BMs: NL CONSTIPATION OR DIARRHEA. CONSTIPATION: MAY GO 1-2 DAYS W/O A BM. CAME TO ED FOR LEG SWELLING AND PAIN & CAN'T WALK. HAD SUBJECTIVE FEVER/CHILLS. STOPPED DRINKING WHEN SHE CAME TO ED. 5TH A DAY THEN 1 PINT A DAY THEN A FEW SWALLOWS HERE AND THERE AFTER RELEASED FROM HOSPITAL IN FEB 2017. HAD 3 OR 4 EPISODE OF PANCREATITIS IN THE PAST. MAY HAVE TROUBLE WITH SWALLOWING WATER OR COFFEE AND IT WON'T GO DOWN RIGHT. PT DENIES CHEST PAIN, SHORTNESS OF BREATH,  CHANGE IN BOWEL IN HABITS, abdominal pain, problems with sedation, OR heartburn or indigestion. DOESN'T EVER HAVE EAT SUGAR. WEIGHT PRIOR TO GASTRIC BYPASS: 314 LBS AT [facility]. NEVER HAD TCS OR EGD. HAD BLOOD TRANSFUION IN THE PAST DUE TO ANEMIA. DENIES SMOKING MARIJUANA, COCAINE,. OR HEROIN. SEXULLY ACTIVE:COUPEL WEEKS AGO-NO CONDOM?? DOESN'T REALLY HAVE A  MENSTRUAL CYCLE ANYMORE. LAST DEPO SHOT 2010. HER SISTER HAS HER BABY.   Past Medical History  Diagnosis Date  . Gastric bypass status for obesity   . Pancreatitis   . Depression   . Alcohol abuse    Past Surgical History  Procedure Laterality Date  . Gastric bypass     Prior to Admission medications   Medication Sig Start Date End Date Taking? Authorizing Provider  acetaminophen (TYLENOL) 500 MG tablet Take 1,000 mg by mouth every 6 (six) hours as needed for moderate pain.   Yes Historical Provider, MD  cephALEXin (KEFLEX) 500 MG capsule Take 1 capsule (500 mg total) by mouth 4 (four) times daily. 09/18/15  Yes Linwood Dibbles, MD  ibuprofen (ADVIL,MOTRIN) 200 MG tablet Take 600 mg by mouth every 6 (six) hours as needed for moderate pain.   Yes Historical Provider, MD  Multiple Vitamin (MULTIVITAMIN WITH MINERALS) TABS tablet Take 1 tablet by mouth daily. 10/18/14  Yes Lesle Chris Black, NP  omeprazole (PRILOSEC) 20 MG capsule Take 20 mg by mouth every morning.    Yes Historical Provider, MD  promethazine (PHENERGAN) 25 MG tablet Take 1 tablet (25 mg total) by mouth every 6 (six) hours as needed for nausea or vomiting. 09/18/15  Yes Linwood Dibbles, MD  diazepam (VALIUM) 5 MG tablet Take 1 tablet by mouth 3 times a day for one week, followed by 1 tablet by mouth twice a day for one week, followed by 1 tablet by mouth daily for 1 week then stop Quantity sufficient for taper Patient not taking: Reported on 09/18/2015 09/05/15   Jeralyn Bennett, MD  ondansetron (ZOFRAN ODT)  4 MG disintegrating tablet Take 1 tablet (4 mg total) by mouth every 8 (eight) hours as needed for nausea or vomiting. Patient not taking: Reported on 09/18/2015 09/05/15   Jeralyn BennettEzequiel Zamora, MD  oxyCODONE (OXY IR/ROXICODONE) 5 MG immediate release tablet Take 1 tablet (5 mg total) by mouth every 8 (eight) hours as needed for severe pain. Patient not taking: Reported on 09/18/2015 09/05/15   Jeralyn BennettEzequiel Zamora, MD    Current Facility-Administered  Medications  Medication Dose Route Frequency Provider Last Rate Last Dose  . 0.9 %  sodium chloride infusion   Intravenous Continuous Debby Crosley, MD 75 mL/hr at 09/29/15 1242    . alum & mag hydroxide-simeth (MAALOX/MYLANTA) 200-200-20 MG/5ML suspension 30 mL  30 mL Oral Q6H PRN Debby Crosley, MD      . folic acid (FOLVITE) tablet 1 mg  1 mg Oral Daily Debby Crosley, MD   1 mg at 09/29/15 1238  . heparin injection 5,000 Units  5,000 Units Subcutaneous 3 times per day Gery Prayebby Crosley, MD   5,000 Units at 09/30/15 0521  . ipratropium-albuterol (DUONEB) 0.5-2.5 (3) MG/3ML nebulizer solution 3 mL  3 mL Nebulization Q4H PRN Debby Crosley, MD      . LORazepam (ATIVAN) tablet 1 mg  1 mg Oral Q6H PRN Gery Prayebby Crosley, MD       Or  . LORazepam (ATIVAN) injection 1 mg  1 mg Intravenous Q6H PRN Debby Crosley, MD      . LORazepam (ATIVAN) tablet 0-4 mg  0-4 mg Oral Q6H Debby Crosley, MD   1 mg at 09/29/15 2110   Followed by  . LORazepam (ATIVAN) tablet 0-4 mg  0-4 mg Oral Q12H Debby Crosley, MD      . multivitamin with minerals tablet 1 tablet  1 tablet Oral Daily Gery Prayebby Crosley, MD   1 tablet at 09/29/15 1237  . oxyCODONE (Oxy IR/ROXICODONE) immediate release tablet 5 mg  5 mg Oral Q8H PRN Standley Brookinganiel P Goodrich, MD   5 mg at 09/30/15 0820  . piperacillin-tazobactam (ZOSYN) IVPB 3.375 g  3.375 g Intravenous Q8H Vanetta MuldersScott Zackowski, MD   3.375 g at 09/30/15 0820  . promethazine (PHENERGAN) tablet 12.5 mg  12.5 mg Oral Q6H PRN Debby Crosley, MD   12.5 mg at 09/30/15 0820  . sodium chloride flush (NS) 0.9 % injection 10-40 mL  10-40 mL Intracatheter Q12H Standley Brookinganiel P Goodrich, MD      . sodium chloride flush (NS) 0.9 % injection 10-40 mL  10-40 mL Intracatheter PRN Standley Brookinganiel P Goodrich, MD      . sodium chloride flush (NS) 0.9 % injection 3 mL  3 mL Intravenous Q12H Debby Crosley, MD   3 mL at 09/28/15 2243  . thiamine (VITAMIN B-1) tablet 100 mg  100 mg Oral Daily Debby Crosley, MD   100 mg at 09/29/15 1237   Or  . thiamine  (B-1) injection 100 mg  100 mg Intravenous Daily Debby Crosley, MD      . vancomycin (VANCOCIN) IVPB 1000 mg/200 mL premix  1,000 mg Intravenous Q12H Vanetta MuldersScott Zackowski, MD   1,000 mg at 09/30/15 0523   Allergies as of 09/28/2015 - Review Complete 09/28/2015  Allergen Reaction Noted  . Aspirin Other (See Comments) 03/10/2013   FAMILY HISTORY: NO COLON CANCER OR POLYPS. DAD LEFT AT AGE 35.  Social History   Social History  . Marital Status: Divorced    Spouse Name: N/A  . Number of Children: N/A  . Years of Education: N/A  Social History Main Topics  . Smoking status: Current Every Day Smoker -- 0.50 packs/day    Types: Cigarettes  . Smokeless tobacco: None  . Alcohol Use: Yes     Comment: daily  . Drug Use: No  . Sexual Activity: Yes    Birth Control/ Protection: None   Other Topics Concern  . 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   Review of Systems: PER HPI OTHERWISE ALL SYSTEMS ARE NEGATIVE.  Vitals: Blood pressure 93/44, pulse 100, temperature 98.1 F (36.7 C), temperature source Oral, resp. rate 20, height  (1.727 m), weight 185 lb (83.915 kg), last menstrual period 08/29/2015, SpO2 100 %.  Physical Exam: General:   Alert,  JAUNDICED, pleasant and cooperative in MILD DISTRESS Head:  Normocephalic and atraumatic. Eyes:  Sclera clear, icterus.   Conjunctiva pink. Mouth:  No lesions, dentition ABnormal. Neck:  Supple; no masses. Lungs:  Clear throughout to auscultation.   No wheezes. No acute distress. Heart:  Regular rate and rhythm; no murmurs, clicks, rubs,  or gallops. Abdomen:  Soft, MILD TTP IN BUQs, nondistended. No masses noted. Normal bowel sounds, without guarding, and without rebound.   Msk:  Symmetrical with gross deformities. Normal posture. Extremities:  With PROFOUND EDEMA & ERTHEMA-DRY DRESSING PLACE, RUPTURED BULLA ON RIGHT INNER/POSTERIOR THIGH Neurologic:  Alert and  oriented  x4;  grossly normal neurologically. Cervical Nodes:  No significant cervical adenopathy. Psych:  Alert and cooperative. Normal mood and FLAT affect.  Lab Results:  Recent Labs  09/28/15 1449 09/29/15 0433 09/30/15 0453  WBC 7.8 7.3 5.9  HGB 9.3* 8.2* 7.5*  HCT 28.3* 25.2* 22.2*  PLT 123* 142* 129*   BMET  Recent Labs  09/29/15 0433 09/30/15 0453  NA 139 138  K 4.1 3.5  CL 109 111  CO2 20* 21*  GLUCOSE 113* 93  BUN 9 6  CREATININE 0.90 0.64  CALCIUM 7.3* 6.9*   LFT  Recent Labs  09/30/15 0453  PROT 4.0*  ALBUMIN 1.4*  AST 60*  ALT 60*  ALKPHOS 167*  BILITOT 4.6*     Studies/Results: CT FEB 2017-FATTY LIVER, NO ASCITES   LOS: 2 days   Camara Rosander  09/30/2015, 9:09 AM

## 2015-10-01 DIAGNOSIS — F10288 Alcohol dependence with other alcohol-induced disorder: Secondary | ICD-10-CM

## 2015-10-01 DIAGNOSIS — L03116 Cellulitis of left lower limb: Secondary | ICD-10-CM

## 2015-10-01 DIAGNOSIS — K7010 Alcoholic hepatitis without ascites: Secondary | ICD-10-CM | POA: Diagnosis present

## 2015-10-01 LAB — CBC
HEMATOCRIT: 22.6 % — AB (ref 36.0–46.0)
HEMOGLOBIN: 7.5 g/dL — AB (ref 12.0–15.0)
MCH: 32.9 pg (ref 26.0–34.0)
MCHC: 33.2 g/dL (ref 30.0–36.0)
MCV: 99.1 fL (ref 78.0–100.0)
PLATELETS: 143 10*3/uL — AB (ref 150–400)
RBC: 2.28 MIL/uL — AB (ref 3.87–5.11)
RDW: 18.2 % — ABNORMAL HIGH (ref 11.5–15.5)
WBC: 6.8 10*3/uL (ref 4.0–10.5)

## 2015-10-01 LAB — COMPREHENSIVE METABOLIC PANEL
ALBUMIN: 1.4 g/dL — AB (ref 3.5–5.0)
ALT: 54 U/L (ref 14–54)
AST: 56 U/L — AB (ref 15–41)
Alkaline Phosphatase: 193 U/L — ABNORMAL HIGH (ref 38–126)
Anion gap: 6 (ref 5–15)
BUN: 4 mg/dL — AB (ref 6–20)
CHLORIDE: 108 mmol/L (ref 101–111)
CO2: 22 mmol/L (ref 22–32)
Calcium: 7 mg/dL — ABNORMAL LOW (ref 8.9–10.3)
Creatinine, Ser: 0.63 mg/dL (ref 0.44–1.00)
GFR calc Af Amer: >60 mL/min (ref >60)
Glucose, Bld: 121 mg/dL — ABNORMAL HIGH (ref 65–99)
POTASSIUM: 3.5 mmol/L (ref 3.5–5.1)
Sodium: 136 mmol/L (ref 135–145)
Total Bilirubin: 5.2 mg/dL — ABNORMAL HIGH (ref 0.3–1.2)
Total Protein: 4.1 g/dL — ABNORMAL LOW (ref 6.5–8.1)

## 2015-10-01 LAB — BILIRUBIN, DIRECT: BILIRUBIN DIRECT: 3 mg/dL — AB (ref 0.1–0.5)

## 2015-10-01 LAB — VANCOMYCIN, TROUGH: VANCOMYCIN TR: 17 ug/mL (ref 10.0–20.0)

## 2015-10-01 MED ORDER — ONDANSETRON HCL 4 MG PO TABS: 4.0000 mg | ORAL_TABLET | Freq: Three times a day (TID) | ORAL | Status: DC

## 2015-10-01 MED ORDER — RISAQUAD PO CAPS
2.0000 | ORAL_CAPSULE | Freq: Every day | ORAL | Status: DC
  Administered 2015-10-01 – 2015-10-17 (×17): 2 via ORAL
  Filled 2015-10-01 (×18): qty 2

## 2015-10-01 MED ORDER — ONDANSETRON HCL 4 MG PO TABS
4.0000 mg | ORAL_TABLET | Freq: Three times a day (TID) | ORAL | Status: DC
  Administered 2015-10-01 – 2015-10-17 (×63): 4 mg via ORAL
  Filled 2015-10-01 (×64): qty 1

## 2015-10-01 MED ORDER — FUROSEMIDE 10 MG/ML IJ SOLN
20.0000 mg | Freq: Two times a day (BID) | INTRAMUSCULAR | Status: DC
  Administered 2015-10-01 – 2015-10-04 (×7): 20 mg via INTRAVENOUS
  Filled 2015-10-01 (×7): qty 2

## 2015-10-01 MED ORDER — ALBUMIN HUMAN 25 % IV SOLN
25.0000 g | Freq: Once | INTRAVENOUS | Status: AC
  Administered 2015-10-01: 25 g via INTRAVENOUS
  Filled 2015-10-01: qty 100

## 2015-10-01 NOTE — Progress Notes (Signed)
Patient ID: Jennifer [name], female   DOB: 1971/04/18, 45 y.o.   MRN: [id number]   Assessment/Plan: ADMITTED WITH CELLULITIS. CLINICALLY IMPROVED. HB TRENDING DOWN WITHOUT ACTIVE BLEEDING.   PLAN: 1. SUPPORTIVE CARE. MONITOR FOR SYMPTOMS: BRBRPR OR MELENA. NO INDICATION FOR ENDOSCOPY AT THIS TIME. 2. ADD PROBOTIC DAILY 3. CHECK DIRECT BILI 4. ZOFRAN PO QAC/HS    Subjective: Since I last evaluated the patient her legs feels better. NOT HAPPY WITH FOOD. NO ABDOMINAL PAIN. NAUSEA NOT IDEALLY CONTROLLED.  Objective: Vital signs in last 24 hours: Filed Vitals:   10/01/15 0140 10/01/15 0600  BP: 112/66 114/69  Pulse: 115 100  Temp: 98.6 F (37 C) 98.2 F (36.8 C)  Resp: 20 20     General appearance: alert, cooperative and no distress Resp: clear to auscultation bilaterally Cardio: regular rate and rhythm GI: soft, non-tender; bowel sounds normal; no masses,  no organomegaly  Lab Results:  ALB 1.4 Cr 0.63 T BILI 5.2 AST 56 ALT 54 PLT CT 143 Hb 7.5   Studies/Results: No results found.  Medications: I have reviewed the patient's current medications.   LOS: 5 days   Jonette EvaSandi Pamelyn Bancroft 12/23/2013, 2:23 PM

## 2015-10-01 NOTE — Progress Notes (Signed)
Pharmacy Antibiotic Note  Jennifer [name] is a 45 y.o. female admitted on 09/28/2015 with sepsis.  Antibiotics have been narrowed to vanc for cellulitis.  Vanc trough is 17 today  Plan: Cont vanc 1000mg  IV q12hrs  Height: 5\' 8"  (172.7 cm) Weight: 185 lb (83.915 kg) IBW/kg (Calculated) : 63.9  Temp (24hrs), Avg:98.5 F (36.9 C), Min:98.2 F (36.8 C), Max:98.8 F (37.1 C)   Recent Labs Lab 09/28/15 1449 09/28/15 1451 09/28/15 1722 09/28/15 2045 09/29/15 0433 09/29/15 1237 09/29/15 1547 09/30/15 0453 10/01/15 0613 10/01/15 1850  WBC 7.8  --   --   --  7.3  --   --  5.9 6.8  --   CREATININE 0.97  --   --   --  0.90  --   --  0.64 0.63  --   LATICACIDVEN  --  4.34* 4.3* 4.7*  --  4.5* 3.8*  --   --   --   VANCOTROUGH  --   --   --   --   --   --   --   --   --  17    Estimated Creatinine Clearance: 102.9 mL/min (by C-G formula based on Cr of 0.63).    Allergies  Allergen Reactions  . Aspirin Other (See Comments)    Cant take due to gastric bypass    Anti-infectives    Start     Dose/Rate Route Frequency Ordered Stop   09/29/15 0600  vancomycin (VANCOCIN) IVPB 1000 mg/200 mL premix     1,000 mg 200 mL/hr over 60 Minutes Intravenous Every 12 hours 09/28/15 2130     09/29/15 0100  piperacillin-tazobactam (ZOSYN) IVPB 3.375 g  Status:  Discontinued     3.375 g 12.5 mL/hr over 240 Minutes Intravenous Every 8 hours 09/28/15 2130 10/01/15 1257   09/28/15 2200  piperacillin-tazobactam (ZOSYN) IVPB 3.375 g  Status:  Discontinued     3.375 g 12.5 mL/hr over 240 Minutes Intravenous 3 times per day 09/28/15 2129 09/28/15 2133   09/28/15 1615  vancomycin (VANCOCIN) 1,500 mg in sodium chloride 0.9 % 500 mL IVPB     1,500 mg 250 mL/hr over 120 Minutes Intravenous  Once 09/28/15 1533 09/28/15 1804   09/28/15 1530  piperacillin-tazobactam (ZOSYN) IVPB 3.375 g     3.375 g 100 mL/hr over 30 Minutes Intravenous  Once 09/28/15 1526 09/28/15 1632   09/28/15 1530  vancomycin (VANCOCIN)  IVPB 1000 mg/200 mL premix  Status:  Discontinued     1,000 mg 200 mL/hr over 60 Minutes Intravenous  Once 09/28/15 1526 09/28/15 1707     Results for orders placed or performed during the hospital encounter of 09/28/15  Blood Culture (routine x 2)     Status: None (Preliminary result)   Collection Time: 09/28/15  3:46 PM  Result Value Ref Range Status   Specimen Description BLOOD  Final   Special Requests NONE  Final   Culture NO GROWTH 3 DAYS  Final   Report Status PENDING  Incomplete  Blood Culture (routine x 2)     Status: None (Preliminary result)   Collection Time: 09/28/15  4:03 PM  Result Value Ref Range Status   Specimen Description BLOOD  Final   Special Requests NONE  Final   Culture NO GROWTH 3 DAYS  Final   Report Status PENDING  Incomplete  Urine culture     Status: None   Collection Time: 09/28/15  8:45 PM  Result Value Ref Range Status  Specimen Description URINE, CATHETERIZED  Final   Special Requests NONE  Final   Culture   Final    NO GROWTH 1 DAY Performed at [facility]    Report Status 09/30/2015 FINAL  Final   Thank you for allowing pharmacy to be a part of this patient's care.  Woodfin Ganja 10/01/2015 8:43 PM

## 2015-10-01 NOTE — Progress Notes (Signed)
PROGRESS NOTE  KERIN KREN ZOX:[id number] DOB: [birth year] DOA: 09/28/2015 PCP: [facility]  Summary: 44 year old woman with history of alcohol abuse, recent pancreatitis, chronic lower extremity edema presented with progressive rash of the lower extremities and now bilateral wrists as well as edema both legs. Recently treated with Keflex. Transiently hypotensive in the emergency department. Admitted for bilateral lower summary cellulitis, possible early sepsis  Assessment/Plan: 1. Bilateral lower extremity cellulitis continues to improve with empiric antibiotic therapy. There are 2 vancomycin. Early sepsis considered on admission with elevated lactic acid hypotension. Clinically does not appear to sepsis. Elevated lactic acid likely more related to dehydration, malnutrition and poor oral intake. Blood cultures no growth thus far. Urine culture negative. 2. Elevated LFTs secondary to acute on chronic alcoholic hepatitis in underlying severe diffuse hepatic steatosis seen on CT 08/2015 . 3. Anasarca, follow-up echocardiogram. Suspect related to poor nutrition, hypoalbuminemia.  4. Normocytic anemia, thrombocytopenia. Stable. Suspect secondary to alcohol use. INR normal 1.47. No evidence of bleeding.  5. Alcohol dependence. Drinks 1 pint of vodka daily. No evidence of withdrawal.  6. 08/2015 admitted for alcoholic hepatitis.  7. PMH gastric bypass, pancreatitis 8. Depression  9. Tobacco use disorder. Counseled on cessation.    Appears improved today. Laboratory studies are stable. No evidence of alcohol withdrawal.  Narrow to vancomycin.  Still has substantial volume overload. Plan IV Lasix and albumin today.  Request PT.  Check BMP in the am  Code Status: Full DVT prophylaxis: Heparin Family Communication: No family present at bedside Disposition Plan: Discharge once improved  Brendia Sacks, MD  [facility]  Pager [phone] If 7PM-7AM, please contact  night-coverage at [email or web address], password [facility] 10/01/2015, 7:56 AM  LOS: 3 days   Consultants:  none  Procedures:  ECHO  Antibiotics:  Zosyn 3/16>>  Vanc 3/16 >>  HPI/Subjective: Mild abdominal pain, legs are feeling better although they still have mild burning sensations. Able to eat and sleep without difficulty. No reports of chest pain, shortness of breath, n/v/d. Had bowel movement today.   Objective: Filed Vitals:   09/30/15 1430 09/30/15 2200 10/01/15 0140 10/01/15 0600  BP: 96/62 105/60 112/66 114/69  Pulse: 96 101 115 100  Temp: 98.7 F (37.1 C) 98.8 F (37.1 C) 98.6 F (37 C) 98.2 F (36.8 C)  TempSrc: Oral Oral Oral Oral  Resp: Height:      Weight:      SpO2: 100% 100% 100% 100%    Intake/Output Summary (Last 24 hours) at 10/01/15 0756 Last data filed at 10/01/15 0609  Gross per 24 hour  Intake    960 ml  Output   2200 ml  Net  -1240 ml     Filed Weights   09/28/15 2153  Weight: 83.915 kg (185 lb)    Exam:  General: Appears calm and comfortable chronically ill but overall better today. Very alert. Cardiovascular: RRR, no m/r/g. Pitting 3+ edema bilateral from thighs down  Respiratory: CTA bilaterally, no w/r/r. Normal respiratory effort. Abdomen: soft, non tender and non-distended  Positive bowel sounds Skin: Erythemous rash on arms left greater then right improving. Bilateral lower extremity cellulitis improving with decreased erythema and warmth.  Musculoskeletal: grossly normal tone BUE/BLE.   Psychiatric: grossly normal mood and affect, speech fluent and appropriate  New data reviewed:  CBG stable  UOP 2200  BMP unremarkable   LFTs without significant change  Hgb 7.5 and platelet 143; both stable  Pending data:  Blood and  urine Cx  Scheduled Meds: . folic acid  1 mg Oral Daily  . heparin  5,000 Units Subcutaneous 3 times per day  . LORazepam  0-4 mg Oral Q12H  . multivitamin with minerals  1 tablet Oral Daily    . pantoprazole  40 mg Oral QAC breakfast  . piperacillin-tazobactam (ZOSYN)  IV  3.375 g Intravenous Q8H  . sodium chloride flush  10-40 mL Intracatheter Q12H  . sodium chloride flush  3 mL Intravenous Q12H  . thiamine  100 mg Oral Daily   Or  . thiamine  100 mg Intravenous Daily  . vancomycin  1,000 mg Intravenous Q12H   Continuous Infusions:    Principal Problem:   Lower extremity cellulitis Active Problems:   Alcohol dependence (HCC)   Depression   Gastric bypass status for obesity   Elevated LFTs   Tobacco use disorder   Alcoholic hepatitis   Hypotension   Anemia   Thrombocytopenia (HCC)   Time spent 20 minutes     By signing my name below, I, Zadie CleverlyJessica Augustus attest that this documentation has been prepared under the direction and in the presence of Brendia Sacksaniel Marolyn Urschel, MD Electronically signed: Zadie CleverlyJessica Augustus  10/01/2015 11:26am   I personally performed the services described in this documentation. All medical record entries made by the scribe were at my direction. I have reviewed the chart and agree that the record reflects my personal performance and is accurate and complete. Brendia Sacksaniel Emira Eubanks, MD

## 2015-10-02 LAB — BASIC METABOLIC PANEL
ANION GAP: 9 (ref 5–15)
BUN: <5 mg/dL — ABNORMAL LOW (ref 6–20)
CHLORIDE: 107 mmol/L (ref 101–111)
CO2: 24 mmol/L (ref 22–32)
Calcium: 7.4 mg/dL — ABNORMAL LOW (ref 8.9–10.3)
Creatinine, Ser: 0.62 mg/dL (ref 0.44–1.00)
GFR calc Af Amer: >60 mL/min (ref >60)
GLUCOSE: 92 mg/dL (ref 65–99)
POTASSIUM: 3 mmol/L — AB (ref 3.5–5.1)
Sodium: 140 mmol/L (ref 135–145)

## 2015-10-02 LAB — ECHOCARDIOGRAM COMPLETE
Height: 68 in
WEIGHTICAEL: 2960 [oz_av]

## 2015-10-02 MED ORDER — OXYCODONE HCL 5 MG PO TABS
5.0000 mg | ORAL_TABLET | ORAL | Status: DC | PRN
  Administered 2015-10-02 – 2015-10-04 (×11): 5 mg via ORAL
  Filled 2015-10-02 (×11): qty 1

## 2015-10-02 MED ORDER — ALBUMIN HUMAN 25 % IV SOLN
25.0000 g | Freq: Once | INTRAVENOUS | Status: AC
  Administered 2015-10-02: 25 g via INTRAVENOUS
  Filled 2015-10-02: qty 100

## 2015-10-02 MED ORDER — DOXYCYCLINE HYCLATE 100 MG PO TABS
100.0000 mg | ORAL_TABLET | Freq: Two times a day (BID) | ORAL | Status: DC
  Administered 2015-10-02 – 2015-10-08 (×13): 100 mg via ORAL
  Filled 2015-10-02 (×13): qty 1

## 2015-10-02 MED ORDER — POTASSIUM CHLORIDE CRYS ER 20 MEQ PO TBCR
40.0000 meq | EXTENDED_RELEASE_TABLET | Freq: Two times a day (BID) | ORAL | Status: DC
  Administered 2015-10-02 – 2015-10-10 (×18): 40 meq via ORAL
  Filled 2015-10-02 (×4): qty 2
  Filled 2015-10-02: qty 4
  Filled 2015-10-02 (×2): qty 2
  Filled 2015-10-02: qty 4
  Filled 2015-10-02 (×12): qty 2

## 2015-10-02 NOTE — Progress Notes (Addendum)
PT Cancellation Note  Patient Details Name: Jennifer [name] MRN: [id number] DOB: January 09, 1971   Cancelled Treatment:      Chart reviewed, RN consulted. Holding pt treatment at this time due to excessive lethargy and slurred speech. Pt reports a sleepless night and is trying to catch up on sleep. Will attempt at later date/time. RN is agreeable, states that patient has bene this way since administration of pain meds. Pt reports [facility], but RN has not knowledge of this.     2:32 PM, 10/02/2015 Rosamaria LintsAllan C Buccola, PT, DPT PRN [facility] License # [id number] [phone] (wireless)  [phone] (mobile)

## 2015-10-02 NOTE — Progress Notes (Signed)
PROGRESS NOTE  Jennifer [name] ZOX:[id number] DOB: 11/02/1970 DOA: 09/28/2015 [facility]  Summary: 45 year old woman with history of alcohol abuse, recent pancreatitis, chronic lower extremity edema presented with progressive rash of the lower extremities and now bilateral wrists as well as edema both legs. Recently treated with Keflex. Transiently hypotensive in the emergency department. Admitted for bilateral lower summary cellulitis, possible early sepsis  Assessment/Plan: 1. Bilateral lower extremity cellulitis continues to improve with empiric antibiotic therapy. Early sepsis considered on admission with elevated lactic acid hypotension. Elevated lactic acid likely more related to dehydration, malnutrition and poor oral intake. Blood cultures no growth thus far. Urine culture negative. 2. Elevated LFTs secondary to acute on chronic alcoholic hepatitis in underlying severe diffuse hepatic steatosis seen on CT 08/2015 . 3. Anasarca, improving with albumin and Lasix. Echocardiogram unremarkable. Secondary to poor nutrition, hypoalbuminemia. 4. Normocytic anemia, thrombocytopenia. Suspect secondary to alcohol use. INR normal 1.47. No evidence of bleeding.  5. Alcohol dependence. Drinks 1 pint of vodka daily. No evidence of withdrawal.  6. PMH gastric bypass, pancreatitis 7. Depression  8. Tobacco use disorder.    Overall improving. Excellent diuresis.   Continue IV diuresis and transition to oral abx.   Replace potassium.  Check BMP in a.m.  Code Status: Full DVT prophylaxis: Heparin Family Communication: No family present at bedside Disposition Plan: Home likely 2-3 days.  Brendia Sacksaniel Goodrich, MD  [facility]  Pager [phone] If 7PM-7AM, please contact night-coverage at [email or web address], password [email or web address] 10/02/2015, 10:15 AM  LOS: 4 days   Consultants:  none  Procedures:  ECHO Study Conclusions  - Left ventricle: The cavity size was normal. Wall thickness  was  normal. Systolic function was vigorous. The estimated ejection  fraction was in the range of 65% to 70%. Left ventricular  diastolic function parameters were normal. - Aortic valve: Valve area (VTI): 2.55 cm^2. Valve area (Vmax):  2.44 cm^2. - Technically difficult study.  Antibiotics:  Zosyn 3/16>>  Vanc 3/16 >>  HPI/Subjective: Feels okay. Decreased swelling in legs. Still has some pain in legs. Eating okay.  Objective: Filed Vitals:   10/01/15 2200 10/02/15 0200 10/02/15 0513 10/02/15 0830  BP: 103/56 108/60 86/66 109/68  Pulse: 85 86 83 99  Temp: 98.7 F (37.1 C) 98.5 F (36.9 C) 98.5 F (36.9 C)   TempSrc: Oral Oral Oral   Resp: 20 20 20 20   Height:      Weight:      SpO2: 100% 100% 100% 100%    Intake/Output Summary (Last 24 hours) at 10/02/15 1015 Last data filed at 10/02/15 0830  Gross per 24 hour  Intake    920 ml  Output   3250 ml  Net  -2330 ml     Filed Weights   09/28/15 2153  Weight: 83.915 kg (185 lb)    Exam:  General: Appears calm and comfortable; chronically ill but overall better today. Very alert. Cardiovascular: RRR, no m/r/g. Decreased lower edema still 3+  Respiratory: CTA bilaterally, no w/r/r. Normal respiratory effort. Skin: Bilateral lower extremity cellulitis improving with decreased erythema and warmth.  Psychiatric: grossly normal mood and affect, speech fluent and appropriate  New data reviewed:  CBG stable  Potassium 3.0, otherwise BMP unremarkable   UOP 3250  Pending data:  Blood and urine Cx  Scheduled Meds: . acidophilus  2 capsule Oral Q breakfast  . folic acid  1 mg Oral Daily  . furosemide  20 mg Intravenous Q12H  . heparin  5,000 Units Subcutaneous 3 times per day  . LORazepam  0-4 mg Oral Q12H  . multivitamin with minerals  1 tablet Oral Daily  . ondansetron  4 mg Oral TID AC & HS  . pantoprazole  40 mg Oral QAC breakfast  . potassium chloride  40 mEq Oral BID  . sodium chloride flush  10-40  mL Intracatheter Q12H  . sodium chloride flush  3 mL Intravenous Q12H  . thiamine  100 mg Oral Daily   Or  . thiamine  100 mg Intravenous Daily  . vancomycin  1,000 mg Intravenous Q12H   Continuous Infusions:    Principal Problem:   Lower extremity cellulitis Active Problems:   Alcohol dependence (HCC)   Depression   Gastric bypass status for obesity   Elevated LFTs   Tobacco use disorder   Alcoholic hepatitis   Hypotension   Anemia   Thrombocytopenia (HCC)   Alcoholic hepatitis without ascites   Time spent 20 minutes     By signing my name below, I, Zadie Cleverly attest that this documentation has been prepared under the direction and in the presence of Brendia Sacks, MD Electronically signed: Zadie Cleverly  10/02/2015 10:20am  I personally performed the services described in this documentation. All medical record entries made by the scribe were at my direction. I have reviewed the chart and agree that the record reflects my personal performance and is accurate and complete. Brendia Sacks, MD

## 2015-10-03 LAB — CULTURE, BLOOD (ROUTINE X 2)
CULTURE: NO GROWTH
Culture: NO GROWTH

## 2015-10-03 LAB — BASIC METABOLIC PANEL
Anion gap: 9 (ref 5–15)
CALCIUM: 7.6 mg/dL — AB (ref 8.9–10.3)
CHLORIDE: 106 mmol/L (ref 101–111)
CO2: 24 mmol/L (ref 22–32)
CREATININE: 0.67 mg/dL (ref 0.44–1.00)
GFR calc non Af Amer: >60 mL/min (ref >60)
GLUCOSE: 88 mg/dL (ref 65–99)
Potassium: 3.6 mmol/L (ref 3.5–5.1)
Sodium: 139 mmol/L (ref 135–145)

## 2015-10-03 LAB — RAPID URINE DRUG SCREEN, HOSP PERFORMED
Amphetamines: NOT DETECTED
BARBITURATES: NOT DETECTED
Benzodiazepines: POSITIVE — AB
COCAINE: NOT DETECTED
Opiates: NOT DETECTED
Tetrahydrocannabinol: NOT DETECTED

## 2015-10-03 LAB — PROTIME-INR
INR: 1.6 — AB (ref 0.00–1.49)
PROTHROMBIN TIME: 19.1 s — AB (ref 11.6–15.2)

## 2015-10-03 LAB — HEPATIC FUNCTION PANEL
ALK PHOS: 163 U/L — AB (ref 38–126)
ALT: 41 U/L (ref 14–54)
AST: 77 U/L — ABNORMAL HIGH (ref 15–41)
Albumin: 1.9 g/dL — ABNORMAL LOW (ref 3.5–5.0)
BILIRUBIN DIRECT: 3.7 mg/dL — AB (ref 0.1–0.5)
BILIRUBIN INDIRECT: 2.5 mg/dL — AB (ref 0.3–0.9)
BILIRUBIN TOTAL: 6.2 mg/dL — AB (ref 0.3–1.2)
Total Protein: 4.5 g/dL — ABNORMAL LOW (ref 6.5–8.1)

## 2015-10-03 MED ORDER — ALBUMIN HUMAN 25 % IV SOLN
25.0000 g | Freq: Once | INTRAVENOUS | Status: AC
  Administered 2015-10-03: 25 g via INTRAVENOUS
  Filled 2015-10-03: qty 100

## 2015-10-03 NOTE — Progress Notes (Signed)
Md ordered for pt to be ambulated by nursing staff bid today.  However, patient refuses to ambulate with staff this afternoon.  She did ambulate this morning with PT.  Will continue to monitor.

## 2015-10-03 NOTE — Evaluation (Signed)
Physical Therapy Evaluation Patient Details Name: Jennifer [name] MRN: [id number] DOB: 1970-07-27 Today's Date: 10/03/2015   History of Present Illness  HPI: this is a 45 y/o female with h/o alcohol abuse, tobacco abuse and recent pancreatitis. She has chronic LE edema. She was in the Er 09/18/15 with c/o LE rash and UTI. She was d/c on Keflex which she has been taking. Two days ago she developed worsening rash on legs, splotchy redness, itching, blistering on the legs. She states it hurt to walk on them she c/o pins and needle sensation on her feet. Today she developed a mild similar rash on her wrists. She is unclear if she has had any fevers but reports chills. She denies nausea or vomiting. In the ER the patient was transiently hypotensive with a systolic blood pressure down to 84. She received a liter NS bolus and her blood pressure rebounded. total fluid given in ER 2 L  Clinical Impression  Pt is seen again for a PT evaluation.  She states that she has declined in strength since her last admission and has had buckling of her legs causing falls.  She is alert and oriented today, cooperative.  She is found to have generalized weakness, very labored transfers due to LE edema and very labored gait using a walker.  She was very fatigued after walking 50'.  She will need to have a walker for home use if one was not obtained for her during the last admission.  In the event that she is unable to receive HHPT, I will provide her with a home exercise program.    Follow Up Recommendations Home health PT    Equipment Recommendations  Rolling walker with 5" wheels;3in1 (PT) (if not obtained during last admission)    Recommendations for Other Services   none    Precautions / Restrictions Precautions Precautions: Fall Restrictions Weight Bearing Restrictions: No      Mobility  Bed Mobility Overal bed mobility: Modified Independent                Transfers Overall transfer level: Needs  assistance Equipment used: Rolling walker (2 wheeled) Transfers: Sit to/from [employer] Sit to Stand: Supervision Stand pivot transfers: Min guard       General transfer comment: cues needed for proper hand placement  Ambulation/Gait Ambulation/Gait assistance: Supervision Ambulation Distance (Feet): 50 Feet Assistive device: Rolling walker (2 wheeled) Gait Pattern/deviations: Decreased stride length;Trunk flexed   Gait velocity interpretation: <1.8 ft/sec, indicative of risk for recurrent falls    Stairs            Wheelchair Mobility    Modified Rankin (Stroke Patients Only)       Balance Overall balance assessment: History of Falls;Needs assistance Sitting-balance support: No upper extremity supported;Feet supported Sitting balance-Leahy Scale: Good     Standing balance support: Bilateral upper extremity supported Standing balance-Leahy Scale: Good                               Pertinent Vitals/Pain Pain Assessment: 0-10 Pain Score: 8  Pain Location: ankles and calves Pain Descriptors / Indicators: Aching Pain Intervention(s): Limited activity within patient's tolerance;Patient requesting pain meds-RN notified;Repositioned;Monitored during session    Home Living Family/patient expects to be discharged to:: Private residence Living Arrangements: Spouse/significant other Available Help at Discharge: Family Type of Home: House Home Access: Stairs to enter Entrance Stairs-Rails: Can reach both Entrance Stairs-Number of Steps: 2  Home Layout: One level Home Equipment: Wheelchair - manual Additional Comments: pt states that she does not have a walker but apparently one was recommended for her at last admission    Prior Function Level of Independence: Independent         Comments: pt reports progressive weakness and minimal assist at home...she needed a walker while here at the hospital 2 weeks ago but pt denies having one  at home     Hand Dominance   Dominant Hand: Right    Extremity/Trunk Assessment               Lower Extremity Assessment: Generalized weakness (LEs moderately edematous)         Communication   Communication: No difficulties  Cognition Arousal/Alertness: Awake/alert Behavior During Therapy: WFL for tasks assessed/performed Overall Cognitive Status: Within Functional Limits for tasks assessed                      General Comments      Exercises Total Joint Exercises Short Arc Quad: AROM;Both;5 reps;Supine Heel Slides: AROM;Both;Supine;5 reps Hip ABduction/ADduction: AROM;5 reps;Both;Supine      Assessment/Plan    PT Assessment Patient needs continued PT services  PT Diagnosis Difficulty walking;Generalized weakness   PT Problem List Decreased strength;Decreased activity tolerance;Decreased mobility  PT Treatment Interventions Gait training;Therapeutic exercise   PT Goals (Current goals can be found in the Care Plan section) Acute Rehab PT Goals Patient Stated Goal: none stated PT Goal Formulation: With patient Time For Goal Achievement: 10/17/15 Potential to Achieve Goals: Good    Frequency Min 3X/week   Barriers to discharge Decreased caregiver support boyfriend not very "patient" per pt report    Co-evaluation               End of Session Equipment Utilized During Treatment: Gait belt Activity Tolerance: Patient limited by fatigue Patient left: in bed;with call bell/phone within reach Nurse Communication: Mobility status         Time: 1230-1316 PT Time Calculation (min) (ACUTE ONLY): 46 min   Charges:   PT Evaluation $PT Eval Moderate Complexity: 1 Procedure PT Treatments $Therapeutic Exercise: 8-22 mins   PT G CodesMyrlene Broker:        Sumayya Muha L  PT 10/03/2015, 1:45 PM

## 2015-10-03 NOTE — Progress Notes (Signed)
    Subjective: Wants to eat better food. Stating she will chew well, eat small amounts and stop when she is full, as she does at home. No confusion. No abdominal pain. Feels like her lower extremities are better. No overt GI bleeding.   Objective: Vital signs in last 24 hours: Temp:  [98.4 F (36.9 C)-98.9 F (37.2 C)] 98.9 F (37.2 C) (03/21 0500) Pulse Rate:  [75-99] 75 (03/21 0500) Resp:  [20] 20 (03/21 0500) BP: (95-109)/(52-68) 95/52 mmHg (03/21 0500) SpO2:  [95 %-100 %] 95 % (03/21 0500) Last BM Date: 10/02/15 General:   Alert and oriented, pleasant, jaundiced, appears older than stated age Eyes:  +scleral icterus  Abdomen:  Bowel sounds present, soft, non-tender, non-distended. Obese Extremities:  Bilateral lower extremity cellulitis improving. Significant bruising to right upper extremity below PICC line Neurologic:  Alert and  oriented x4 Psych:  Alert and cooperative.   Intake/Output from previous day: 03/20 0701 - 03/21 0700 In: 720 [P.O.:720] Out: 1900 [Urine:1900] Intake/Output this shift:    Lab Results:  Recent Labs  10/01/15 0613  WBC 6.8  HGB 7.5*  HCT 22.6*  PLT 143*   BMET  Recent Labs  10/01/15 0613 10/02/15 0532 10/03/15 0533  NA 136 140 139  K 3.5 3.0* 3.6  CL 108 107 106  CO2 22 24 24   GLUCOSE 121* 92 88  BUN 4* <5* <5*  CREATININE 0.63 0.62 0.67  CALCIUM 7.0* 7.4* 7.6*   LFT  Recent Labs  10/01/15 0613  PROT 4.1*  ALBUMIN 1.4*  AST 56*  ALT 54  ALKPHOS 193*  BILITOT 5.2*  BILIDIR 3.0*    Assessment: 45 year old female admitted with cellulitis and decompensated liver disease in setting of ETOH hepatitis/sepsis. Discriminant function 30 on admission. Most recent imaging Feb 2017 with severe hepatic steatosis. Not a candidate for steroids due to sepsis and DF less than 32 on admission. Will need outpatient EGD/TCS. Hgb stable and without any overt GI bleeding. Anemia multifactorial in setting of prior malabsorptive surgery.    Plan: EGD/colonoscopy as outpatient Recheck HFP, INR today Zofran scheduled ETOH cessation Low fat diet Follow peripherally   Nira RetortAnna W. Sams, ANP-BC [facility]    LOS: 5 days    10/03/2015, 8:09 AM

## 2015-10-03 NOTE — Progress Notes (Addendum)
PROGRESS NOTE  ERYCA BOLTE ZOX:[id number] DOB: 09-03-1970 DOA: 09/28/2015 PCP: [facility]  Summary: 45 year old woman with history of alcohol abuse, recent pancreatitis, chronic lower extremity edema presented with progressive rash of the lower extremities and now bilateral wrists as well as edema both legs. Recently treated with Keflex. Transiently hypotensive in the emergency department. Admitted for bilateral lower summary cellulitis, possible early sepsis. Cellulitis rapidly improved and is now apparently resolved on oral antibiotics. Anasarca improving with diuretics and IV albumin. Seen by gastroenterology with recommendations for supportive care. Likely home in the next 2-3 days of diuresis continues at present base.  Assessment/Plan: 1. Bilateral lower extremity cellulitis, appears resolved with empiric antibiotic therapy. Early sepsis considered on admission with elevated lactic acid hypotension. Elevated lactic acid likely more related to dehydration, malnutrition and poor oral intake. Blood cultures and urine culture negative. 2. Elevated LFTs secondary to acute on chronic alcoholic hepatitis in underlying severe diffuse hepatic steatosis seen on CT 08/2015 . Steroids contraindicated secondary to infection per GI. 3. Anasarca, improving with albumin and Lasix. Remarkably improved. Echocardiogram unremarkable. Secondary to poor nutrition, hypoalbuminemia. 4. Normocytic anemia, thrombocytopenia. Suspect secondary to alcohol use. INR normal 1.47. No evidence of bleeding.  5. Alcohol dependence. Drinks 1 pint of vodka daily. No evidence of withdrawal.  6. PMH gastric bypass, pancreatitis  7. Depression  8. Tobacco use disorder.    Excellent diuresis, cellulitis appears resolved. Continue diuretics and albumin.  Continue po abx.   Likely home next 48 hours of diuresis continues.  Code Status: Full DVT prophylaxis: Heparin Family Communication: No family present at  bedside Disposition Plan: Home likely 2-3 days.  Brendia Sacks, MD  [facility]  Pager [phone] If 7PM-7AM, please contact night-coverage at [email or web address], password [facility] 10/03/2015, 10:36 AM  LOS: 5 days   Consultants:  none  Procedures:  ECHO Study Conclusions  - Left ventricle: The cavity size was normal. Wall thickness was  normal. Systolic function was vigorous. The estimated ejection  fraction was in the range of 65% to 70%. Left ventricular  diastolic function parameters were normal. - Aortic valve: Valve area (VTI): 2.55 cm^2. Valve area (Vmax):  2.44 cm^2. - Technically difficult study.  Antibiotics:  Zosyn 3/16>>3/20  Vanc 3/16 >>3/20  Doxycycline 3/20>>  HPI/Subjective: Feels better. Was unable to sleep due to diuretics. Swelling has gotten better.   Objective: Filed Vitals:   10/02/15 1500 10/02/15 1558 10/02/15 2100 10/03/15 0500  BP: 105/62  104/61 95/52  Pulse: 98  90 75  Temp: 98.8 F (37.1 C)  98.4 F (36.9 C) 98.9 F (37.2 C)  TempSrc: Oral  Oral Oral  Resp: Height:      Weight:      SpO2: 100% 96% 99% 95%    Intake/Output Summary (Last 24 hours) at 10/03/15 1036 Last data filed at 10/03/15 0825  Gross per 24 hour  Intake    720 ml  Output   2000 ml  Net  -1280 ml     Filed Weights   09/28/15 2153  Weight: 83.915 kg (185 lb)    Exam General:NAD; chronically ill but overall improving. Cardiovascular: RRR, no m/r/g.  Respiratory: CTA bilaterally, no w/r/r. Normal respiratory effort. Abdomen: Non distended, non-tender, positive bowel sounds Skin:  Near total resolution of BLE erythema. Edema substantially decreased still 2+ up to thigh  Psychiatric: grossly normal mood and affect, speech fluent and appropriate  New data reviewed:  CBG stable  BMP unremarkable  UOP 1900 + 3 voids.   Pending data:  Blood and urine Cx  Scheduled Meds: . acidophilus  2 capsule Oral Q breakfast  . doxycycline   100 mg Oral Q12H  . folic acid  1 mg Oral Daily  . furosemide  20 mg Intravenous Q12H  . heparin  5,000 Units Subcutaneous 3 times per day  . multivitamin with minerals  1 tablet Oral Daily  . ondansetron  4 mg Oral TID AC & HS  . pantoprazole  40 mg Oral QAC breakfast  . potassium chloride  40 mEq Oral BID  . sodium chloride flush  10-40 mL Intracatheter Q12H  . sodium chloride flush  3 mL Intravenous Q12H  . thiamine  100 mg Oral Daily   Or  . thiamine  100 mg Intravenous Daily   Continuous Infusions:    Principal Problem:   Lower extremity cellulitis Active Problems:   Alcohol dependence (HCC)   Depression   Gastric bypass status for obesity   Elevated LFTs   Tobacco use disorder   Alcoholic hepatitis   Hypotension   Anemia   Thrombocytopenia (HCC)   Alcoholic hepatitis without ascites   Time spent 20 minutes     By signing my name below, I, Zadie CleverlyJessica Augustus attest that this documentation has been prepared under the direction and in the presence of Brendia Sacksaniel Goodrich, MD Electronically signed: Zadie CleverlyJessica Augustus  10/03/2015 10:20am  I personally performed the services described in this documentation. All medical record entries made by the scribe were at my direction. I have reviewed the chart and agree that the record reflects my personal performance and is accurate and complete. Brendia Sacksaniel Goodrich, MD

## 2015-10-04 ENCOUNTER — Encounter: Payer: Self-pay | Admitting: Gastroenterology

## 2015-10-04 ENCOUNTER — Telehealth: Payer: Self-pay | Admitting: Gastroenterology

## 2015-10-04 LAB — COMPREHENSIVE METABOLIC PANEL
ALBUMIN: 2.1 g/dL — AB (ref 3.5–5.0)
ALT: 43 U/L (ref 14–54)
AST: 89 U/L — AB (ref 15–41)
Alkaline Phosphatase: 171 U/L — ABNORMAL HIGH (ref 38–126)
Anion gap: 7 (ref 5–15)
CHLORIDE: 106 mmol/L (ref 101–111)
CO2: 26 mmol/L (ref 22–32)
Calcium: 7.7 mg/dL — ABNORMAL LOW (ref 8.9–10.3)
Creatinine, Ser: 0.74 mg/dL (ref 0.44–1.00)
GFR calc Af Amer: >60 mL/min (ref >60)
GFR calc non Af Amer: >60 mL/min (ref >60)
GLUCOSE: 101 mg/dL — AB (ref 65–99)
POTASSIUM: 3.9 mmol/L (ref 3.5–5.1)
SODIUM: 139 mmol/L (ref 135–145)
Total Bilirubin: 7 mg/dL — ABNORMAL HIGH (ref 0.3–1.2)
Total Protein: 5 g/dL — ABNORMAL LOW (ref 6.5–8.1)

## 2015-10-04 MED ORDER — FUROSEMIDE 10 MG/ML IJ SOLN: 20.0000 mg | Freq: Two times a day (BID) | INTRAMUSCULAR | Status: DC

## 2015-10-04 MED ORDER — OXYCODONE HCL 5 MG PO TABS
5.0000 mg | ORAL_TABLET | ORAL | Status: DC | PRN
  Administered 2015-10-04: 5 mg via ORAL
  Administered 2015-10-05 – 2015-10-12 (×21): 10 mg via ORAL
  Administered 2015-10-12: 5 mg via ORAL
  Administered 2015-10-12 – 2015-10-14 (×9): 10 mg via ORAL
  Administered 2015-10-14: 5 mg via ORAL
  Administered 2015-10-15 (×2): 10 mg via ORAL
  Administered 2015-10-15 – 2015-10-17 (×6): 5 mg via ORAL
  Filled 2015-10-04 (×5): qty 2
  Filled 2015-10-04: qty 1
  Filled 2015-10-04 (×5): qty 2
  Filled 2015-10-04: qty 1
  Filled 2015-10-04 (×3): qty 2
  Filled 2015-10-04: qty 1
  Filled 2015-10-04 (×8): qty 2
  Filled 2015-10-04: qty 1
  Filled 2015-10-04 (×2): qty 2
  Filled 2015-10-04 (×2): qty 1
  Filled 2015-10-04 (×5): qty 2
  Filled 2015-10-04: qty 1
  Filled 2015-10-04 (×3): qty 2
  Filled 2015-10-04: qty 1
  Filled 2015-10-04 (×3): qty 2

## 2015-10-04 MED ORDER — FUROSEMIDE 10 MG/ML IJ SOLN
20.0000 mg | Freq: Two times a day (BID) | INTRAMUSCULAR | Status: DC
  Administered 2015-10-04 – 2015-10-06 (×4): 20 mg via INTRAVENOUS
  Filled 2015-10-04 (×3): qty 2

## 2015-10-04 NOTE — Telephone Encounter (Signed)
APPT MADE AND LETTER SENT  °

## 2015-10-04 NOTE — Progress Notes (Signed)
Physical Therapy Treatment Patient Details Name: Albesa Seenarry L Wildasin MRN: [id number] DOB: 1970/07/24 Today's Date: 10/04/2015    History of Present Illness HPI: this is a 45 y/o female with h/o alcohol abuse, tobacco abuse and recent pancreatitis. She has chronic LE edema. She was in the Er 09/18/15 with c/o LE rash and UTI. She was d/c on Keflex which she has been taking. Two days ago she developed worsening rash on legs, splotchy redness, itching, blistering on the legs. She states it hurt to walk on them she c/o pins and needle sensation on her feet. Today she developed a mild similar rash on her wrists. She is unclear if she has had any fevers but reports chills. She denies nausea or vomiting. In the ER the patient was transiently hypotensive with a systolic blood pressure down to 84. She received a liter NS bolus and her blood pressure rebounded. total fluid given in ER 2 L    PT Comments    Pt reluctantly agreeable to work with me today.  She was provided with a written home exercise program and instructed in each exercise.  She was able to do 10 repetitions of all exercises except for SLR for which she could only do 2.  She very reluctantly agreed to walk.  Her gait with a walker is stable but endurance is poor, only able to ambulate 50'.  She declined sitting up in a chair.    Follow Up Recommendations  Home health PT     Equipment Recommendations  Rolling walker with 5" wheels;3in1 (PT)    Recommendations for Other Services  none     Precautions / Restrictions Precautions Precautions: Fall Restrictions Weight Bearing Restrictions: No    Mobility  Bed Mobility Overal bed mobility: Modified Independent                Transfers Overall transfer level: Modified independent Equipment used: Rolling walker (2 wheeled) Transfers: Sit to/from Stand Sit to Stand: Supervision            Ambulation/Gait Ambulation/Gait assistance: Supervision Ambulation Distance (Feet): 50  Feet Assistive device: Rolling walker (2 wheeled) Gait Pattern/deviations: Decreased stride length;Narrow base of support   Gait velocity interpretation: <1.8 ft/sec, indicative of risk for recurrent falls General Gait Details: gait is very slow and labored by stable.   Stairs            Wheelchair Mobility    Modified Rankin (Stroke Patients Only)       Balance                                    Cognition Arousal/Alertness: Awake/alert Behavior During Therapy: Flat affect Overall Cognitive Status: Within Functional Limits for tasks assessed                      Exercises General Exercises - Lower Extremity Ankle Circles/Pumps: AROM;Both;10 reps;Supine Quad Sets: AROM;Both;10 reps;Supine Gluteal Sets: AROM;Both;10 reps;Supine Short Arc Quad: AROM;Both;10 reps;Supine Heel Slides: AROM;Both;10 reps;Supine Hip ABduction/ADduction: AROM;Both;10 reps;Supine Straight Leg Raises: AROM;Both;5 reps;Supine    General Comments        Pertinent Vitals/Pain Pain Assessment: 0-10 Pain Score: 8  Pain Location: headache Pain Descriptors / Indicators: Headache Pain Intervention(s): Limited activity within patient's tolerance;Patient requesting pain meds-RN notified    Home Living  Prior Function            PT Goals (current goals can now be found in the care plan section) Progress towards PT goals: Progressing toward goals    Frequency  Min 3X/week    PT Plan Current plan remains appropriate    Co-evaluation             End of Session Equipment Utilized During Treatment: Gait belt Activity Tolerance: Patient limited by fatigue Patient left: in bed;with call bell/phone within reach;with bed alarm set     Time: 1330-1410 PT Time Calculation (min) (ACUTE ONLY): 40 min  Charges:  $Gait Training: 8-22 mins $Therapeutic Exercise: 8-22 mins                    G CodesMyrlene Broker L   PT 10/04/2015, 2:18 PM

## 2015-10-04 NOTE — Telephone Encounter (Signed)
Patient needs hospital follow up in 2 months with [facility] BastosAnna or [facility].

## 2015-10-04 NOTE — Progress Notes (Signed)
PROGRESS NOTE  Jennifer [name]:[id number] DOB: 1970/10/18 DOA: 09/28/2015 PCP: [facility]  Summary: 45 year old woman with history of alcohol abuse, recent pancreatitis, chronic lower extremity edema presented with progressive rash of the lower extremities and now bilateral wrists as well as edema both legs. Recently treated with Keflex. Transiently hypotensive in the emergency department. Admitted for bilateral lower summary cellulitis, possible early sepsis. Cellulitis rapidly improved and is now apparently resolved on oral antibiotics. Anasarca improving with diuretics and IV albumin. Seen by gastroenterology with recommendations for supportive care. Likely home in the next 2-3 days of diuresis continues at present base.  Assessment/Plan: 1. Bilateral lower extremity cellulitis, appears resolved with empiric antibiotic therapy. Early sepsis considered on admission with elevated lactic acid hypotension. Elevated lactic acid likely more related to dehydration, malnutrition and poor oral intake. Blood cultures and urine culture negative. 2. Elevated LFTs secondary to acute on chronic alcoholic hepatitis in underlying severe diffuse hepatic steatosis seen on CT 08/2015 . Steroids contraindicated secondary to infection per GI. 3. Anasarca, improving with albumin and Lasix. Good urine output, although patient reports this has not been accurately documented. Echocardiogram unremarkable. Secondary to poor nutrition, hypoalbuminemia. Continue with IV lasix for now 4. Normocytic anemia, thrombocytopenia. Suspect secondary to alcohol use. INR normal 1.47. No evidence of bleeding.  5. Alcohol dependence. Drinks 1 pint of vodka daily. No evidence of withdrawal.  6. PMH gastric bypass, pancreatitis  7. Depression  8. Tobacco use disorder.    Code Status: Full DVT prophylaxis: Heparin Family Communication: No family present at bedside Disposition Plan: Home likely 2-3 days.  Erick Blinks, MD  [facility]  Pager [phone] If 7PM-7AM, please contact night-coverage at [email or web address], password [facility] 10/04/2015, 5:49 PM  LOS: 6 days   Consultants:  none  Procedures:  ECHO Study Conclusions  - Left ventricle: The cavity size was normal. Wall thickness was  normal. Systolic function was vigorous. The estimated ejection  fraction was in the range of 65% to 70%. Left ventricular  diastolic function parameters were normal. - Aortic valve: Valve area (VTI): 2.55 cm^2. Valve area (Vmax):  2.44 cm^2. - Technically difficult study.  Antibiotics:  Zosyn 3/16>>3/20  Vanc 3/16 >>3/20  Doxycycline 3/20>>  HPI/Subjective: Reports good urine output. Overall feeling better  Objective: Filed Vitals:   10/04/15 0632 10/04/15 1147 10/04/15 1342 10/04/15 1445  BP: 109/66 120/71  101/63  Pulse: 88 100  90  Temp: 98.2 F (36.8 C) 98.4 F (36.9 C)  98 F (36.7 C)  TempSrc: Oral Oral  Oral  Resp: Height:      Weight: 85.095 kg (187 lb 9.6 oz)     SpO2: 93% 100% 98% 98%    Intake/Output Summary (Last 24 hours) at 10/04/15 1749 Last data filed at 10/04/15 0919  Gross per 24 hour  Intake    240 ml  Output      0 ml  Net    240 ml     Filed Weights   09/28/15 2153 10/03/15 2105 10/04/15 0632  Weight: 83.915 kg (185 lb) 86.41 kg (190 lb 8 oz) 85.095 kg (187 lb 9.6 oz)    Exam General:NAD; chronically ill but overall improving. Cardiovascular: s1, s2, rrr  Respiratory: CTA bilaterally, no w/r/r. Normal respiratory effort. Abdomen: Non distended, non-tender, positive bowel sounds Skin:  2+ edema b/l  Psychiatric: grossly normal mood and affect, speech fluent and appropriate   Scheduled Meds: . acidophilus  2 capsule Oral  Q breakfast  . doxycycline  100 mg Oral Q12H  . folic acid  1 mg Oral Daily  . furosemide  20 mg Intravenous Q12H  . heparin  5,000 Units Subcutaneous 3 times per day  . multivitamin with minerals  1 tablet Oral  Daily  . ondansetron  4 mg Oral TID AC & HS  . pantoprazole  40 mg Oral QAC breakfast  . potassium chloride  40 mEq Oral BID  . sodium chloride flush  10-40 mL Intracatheter Q12H  . sodium chloride flush  3 mL Intravenous Q12H  . thiamine  100 mg Oral Daily   Or  . thiamine  100 mg Intravenous Daily   Continuous Infusions:    Principal Problem:   Lower extremity cellulitis Active Problems:   Alcohol dependence (HCC)   Depression   Gastric bypass status for obesity   Elevated LFTs   Tobacco use disorder   Alcoholic hepatitis   Hypotension   Anemia   Thrombocytopenia (HCC)   Alcoholic hepatitis without ascites   Time spent 20 minutes

## 2015-10-05 LAB — BASIC METABOLIC PANEL
ANION GAP: 7 (ref 5–15)
BUN: <5 mg/dL — ABNORMAL LOW (ref 6–20)
CO2: 26 mmol/L (ref 22–32)
Calcium: 7.6 mg/dL — ABNORMAL LOW (ref 8.9–10.3)
Chloride: 106 mmol/L (ref 101–111)
Creatinine, Ser: 0.78 mg/dL (ref 0.44–1.00)
Glucose, Bld: 102 mg/dL — ABNORMAL HIGH (ref 65–99)
POTASSIUM: 3.8 mmol/L (ref 3.5–5.1)
SODIUM: 139 mmol/L (ref 135–145)

## 2015-10-05 MED ORDER — HYDROXYZINE HCL 25 MG PO TABS
25.0000 mg | ORAL_TABLET | Freq: Three times a day (TID) | ORAL | Status: DC | PRN
  Administered 2015-10-05 – 2015-10-16 (×5): 25 mg via ORAL
  Filled 2015-10-05 (×6): qty 1

## 2015-10-05 NOTE — Progress Notes (Signed)
Pt has refused offer to ambulate. Pt states that she has no energy "maybe later." Will continue to encourage pt to ambulate throughout the shift

## 2015-10-05 NOTE — Progress Notes (Signed)
PROGRESS NOTE  Jennifer [name]:[id number] DOB: [birth year] DOA: 09/28/2015 PCP: [facility]  Assessment/Plan: 1. Bilateral lower extremity cellulitis, appears resolved with empiric antibiotic therapy. Early sepsis considered on admission with elevated lactic acid and hypotension. Elevated lactic acid likely more related to dehydration, malnutrition and poor oral intake. Blood cultures and urine culture negative. 2. Elevated LFTs secondary to acute on chronic alcoholic hepatitis in underlying severe diffuse hepatic steatosis seen on CT 08/2015 . Steroids contraindicated secondary to infection per GI. 3. Anasarca, improving with  Lasix. Pt continues to have Good urine output, but still has evidence of significant volume overload. Echocardiogram unremarkable. Secondary to poor nutrition, hypoalbuminemia. Continue with IV lasix for now 4. Normocytic anemia, thrombocytopenia. Suspect secondary to alcohol use. INR normal 1.47. No evidence of bleeding.  5. Alcohol dependence. Drinks 1 pint of vodka daily. No evidence of withdrawal.  6. PMH gastric bypass, pancreatitis  7. Depression  8. Tobacco use disorder.    Code Status: Full DVT prophylaxis: Heparin Family Communication: No family present at bedside  Disposition Plan: Home likely within 2-3 days  Erick Blinks, MD  [facility]  Pager [phone] If 7PM-7AM, please contact night-coverage at [email or web address], password [facility] 10/05/2015, 6:43 AM  LOS: 7 days   Consultants:  none  Procedures:  ECHO Study Conclusions  - Left ventricle: The cavity size was normal. Wall thickness was  normal. Systolic function was vigorous. The estimated ejection  fraction was in the range of 65% to 70%. Left ventricular  diastolic function parameters were normal. - Aortic valve: Valve area (VTI): 2.55 cm^2. Valve area (Vmax):  2.44 cm^2. - Technically difficult study.  Antibiotics:  Zosyn 3/16>>3/20  Vanc 3/16  >>3/20  Doxycycline 3/20>>  HPI/Subjective: Swelling is improving slightly. Still urinating well. Is having normal BM. Constantly has a dry mouth, onset several weeks ago. Has been eating decently.   Objective: Filed Vitals:   10/04/15 1845 10/04/15 2000 10/04/15 2306 10/05/15 0400  BP: 110/61 95/47 114/64 118/72  Pulse: 98 88 91 106  Temp: 98.5 F (36.9 C) 98.4 F (36.9 C) 98.3 F (36.8 C) 98.6 F (37 C)  TempSrc: Oral Oral Oral Oral  Resp: Height:      Weight:    85.14 kg (187 lb 11.2 oz)  SpO2: 100% 99% 98% 100%    Intake/Output Summary (Last 24 hours) at 10/05/15 0643 Last data filed at 10/05/15 0343  Gross per 24 hour  Intake    480 ml  Output   1800 ml  Net  -1320 ml     Filed Weights   10/03/15 2105 10/04/15 0632 10/05/15 0400  Weight: 86.41 kg (190 lb 8 oz) 85.095 kg (187 lb 9.6 oz) 85.14 kg (187 lb 11.2 oz)    Exam  General: NAD, looks comfortable Cardiovascular: RRR, S1, S2  Respiratory: clear bilaterally, No wheezing, rales or rhonchi Abdomen: soft, non tender, no distention , bowel sounds normal Musculoskeletal: 2-3+ edema  In lower extremieis b/l Skin: excoriations bilaterally     Scheduled Meds: . acidophilus  2 capsule Oral Q breakfast  . doxycycline  100 mg Oral Q12H  . folic acid  1 mg Oral Daily  . furosemide  20 mg Intravenous BID  . heparin  5,000 Units Subcutaneous 3 times per day  . multivitamin with minerals  1 tablet Oral Daily  . ondansetron  4 mg Oral TID AC & HS  . pantoprazole  40 mg Oral QAC breakfast  .  potassium chloride  40 mEq Oral BID  . sodium chloride flush  10-40 mL Intracatheter Q12H  . sodium chloride flush  3 mL Intravenous Q12H  . thiamine  100 mg Oral Daily   Or  . thiamine  100 mg Intravenous Daily   Continuous Infusions:    Principal Problem:   Lower extremity cellulitis Active Problems:   Alcohol dependence (HCC)   Depression   Gastric bypass status for obesity   Elevated LFTs    Tobacco use disorder   Alcoholic hepatitis   Hypotension   Anemia   Thrombocytopenia (HCC)   Alcoholic hepatitis without ascites   Time spent 25 minutes    By signing my name below, I, Adron BeneGreylon Gawaluck, attest that this documentation has been prepared under the direction and in the presence of Erick BlinksJehanzeb Mikai Meints, MD. Electronically Signed: Adron BeneGreylon Gawaluck 10/05/2015 2:18pm  I, Dr. Erick BlinksJehanzeb Rhea Thrun, personally performed the services described in this documentaiton. All medical record entries made by the scribe were at my direction and in my presence. I have reviewed the chart and agree that the record reflects my personal performance and is accurate and complete  Erick BlinksJehanzeb Janiqua Friscia, MD, 10/05/2015 2:44 PM

## 2015-10-05 NOTE — Progress Notes (Signed)
Pt has refused attempts to ambulate during this shift. Will continue to encourage patient to ambulate.

## 2015-10-06 LAB — COMPREHENSIVE METABOLIC PANEL
ALK PHOS: 157 U/L — AB (ref 38–126)
ALT: 41 U/L (ref 14–54)
ANION GAP: 10 (ref 5–15)
AST: 83 U/L — ABNORMAL HIGH (ref 15–41)
Albumin: 2 g/dL — ABNORMAL LOW (ref 3.5–5.0)
BILIRUBIN TOTAL: 7.3 mg/dL — AB (ref 0.3–1.2)
BUN: <5 mg/dL — ABNORMAL LOW (ref 6–20)
CALCIUM: 7.7 mg/dL — AB (ref 8.9–10.3)
CO2: 24 mmol/L (ref 22–32)
CREATININE: 0.98 mg/dL (ref 0.44–1.00)
Chloride: 104 mmol/L (ref 101–111)
GLUCOSE: 87 mg/dL (ref 65–99)
Potassium: 4.1 mmol/L (ref 3.5–5.1)
Sodium: 138 mmol/L (ref 135–145)
TOTAL PROTEIN: 4.8 g/dL — AB (ref 6.5–8.1)

## 2015-10-06 LAB — CBC
HEMATOCRIT: 23.4 % — AB (ref 36.0–46.0)
HEMOGLOBIN: 7.9 g/dL — AB (ref 12.0–15.0)
MCH: 33.5 pg (ref 26.0–34.0)
MCHC: 33.8 g/dL (ref 30.0–36.0)
MCV: 99.2 fL (ref 78.0–100.0)
Platelets: 137 10*3/uL — ABNORMAL LOW (ref 150–400)
RBC: 2.36 MIL/uL — AB (ref 3.87–5.11)
RDW: 20 % — ABNORMAL HIGH (ref 11.5–15.5)
WBC: 10.1 10*3/uL (ref 4.0–10.5)

## 2015-10-06 MED ORDER — PHENOL 1.4 % MT LIQD
1.0000 | OROMUCOSAL | Status: DC | PRN
  Administered 2015-10-06: 1 via OROMUCOSAL
  Filled 2015-10-06: qty 177

## 2015-10-06 MED ORDER — FUROSEMIDE 10 MG/ML IJ SOLN
40.0000 mg | Freq: Two times a day (BID) | INTRAMUSCULAR | Status: DC
  Administered 2015-10-06 – 2015-10-12 (×13): 40 mg via INTRAVENOUS
  Filled 2015-10-06 (×13): qty 4

## 2015-10-06 NOTE — Care Management Note (Signed)
Case Management Note  Patient Details  Name: Albesa Seenarry L Zukas MRN: [id number] Date of Birth: [birth year]  Expected Discharge Date:  10/02/15               Expected Discharge Plan:  Home w Home Health Services  In-House Referral:  NA  Discharge planning Services  CM Consult  Post Acute Care Choice:    Choice offered to:     DME Arranged:    DME Agency:     HH Arranged:    HH Agency:     Status of Service:  In process, will continue to follow  Medicare Important Message Given:    Date Medicare IM Given:    Medicare IM give by:    Date Additional Medicare IM Given:    Additional Medicare Important Message give by:     If discussed at Long Length of Stay Meetings, dates discussed:    Additional Comments: Pt [facility] Alroy BailiffLinda Lothian. Pt will need HH PT at [facility], Alroy BailiffLinda Lothian, [facility] made aware but currently pt's spouse and mother are unwilling to provide [facility] with required information and for that reason pt may ned unable to receive services. [facility], as the [facility] will not sign HH orders and pt need HH PT. Pt requires continued diuresis. Will cont to follow.   Malcolm Metrohildress, Pasty Manninen Demske, RN 10/06/2015, 11:52 AM

## 2015-10-06 NOTE — Progress Notes (Signed)
PROGRESS NOTE  Jennifer [name] ZOX:[id number] DOB: 1971/03/09 DOA: 09/28/2015 [facility]  Assessment/Plan: 1. Bilateral lower extremity cellulitis, appears resolved with empiric antibiotic therapy. Early sepsis considered on admission with elevated lactic acid and hypotension. Elevated lactic acid likely more related to dehydration, malnutrition and poor oral intake. Blood cultures and urine culture negative. 2. Elevated LFTs secondary to acute on chronic alcoholic hepatitis in underlying severe diffuse hepatic steatosis seen on CT 08/2015 . Steroids contraindicated secondary to infection per GI. 3. Anasarca. Pt reports that her urine output has decreased and still has evidence of significant volume overload. Will need to increase Lasix. Echocardiogram unremarkable. Secondary to poor nutrition, hypoalbuminemia. Continue with IV lasix for now 4. Normocytic anemia, thrombocytopenia. Suspect secondary to alcohol use. Hgb 7.9. INR normal 1.60. No evidence of bleeding.  5. Alcohol dependence. Drinks 1 pint of vodka daily. No evidence of withdrawal.  6. PMH gastric bypass, pancreatitis  7. Depression  8. Tobacco use disorder.    Code Status: Full DVT prophylaxis: Heparin Family Communication: No family present at bedside  Disposition Plan: Home likely within 1-2 days  Erick BlinksJehanzeb Memon, MD  [facility]  Pager [phone] If 7PM-7AM, please contact night-coverage at [email or web address], password [email or web address] 10/06/2015, 6:52 AM  LOS: 8 days   Consultants:  none  Procedures:  ECHO Study Conclusions  - Left ventricle: The cavity size was normal. Wall thickness was  normal. Systolic function was vigorous. The estimated ejection  fraction was in the range of 65% to 70%. Left ventricular  diastolic function parameters were normal. - Aortic valve: Valve area (VTI): 2.55 cm^2. Valve area (Vmax):  2.44 cm^2. - Technically difficult study.  Antibiotics:  Zosyn 3/16>>3/20  Vanc  3/16 >>3/20  Doxycycline 3/20>>  HPI/Subjective: Does not feel better today. Her legs feel worse. They are sore and sensitive. She has not been urinating as much. Her throat has also been hurting recently. She describes her throat as scratching and burning.    Objective: Filed Vitals:   10/05/15 1148 10/05/15 1538 10/05/15 2000 10/06/15 0600  BP:  92/56 98/48 101/67  Pulse:  112 94 110  Temp:  98.6 F (37 C) 98.9 F (37.2 C) 98.3 F (36.8 C)  TempSrc:  Oral Oral   Resp:  20 20 22   Height:      Weight:    86.728 kg (191 lb 3.2 oz)  SpO2: 99% 100% 94% 96%    Intake/Output Summary (Last 24 hours) at 10/06/15 0652 Last data filed at 10/06/15 0600  Gross per 24 hour  Intake    723 ml  Output    700 ml  Net     23 ml     Filed Weights   10/04/15 0632 10/05/15 0400 10/06/15 0600  Weight: 85.095 kg (187 lb 9.6 oz) 85.14 kg (187 lb 11.2 oz) 86.728 kg (191 lb 3.2 oz)    Exam  General: NAD, looks comfortable Cardiovascular: RRR, S1, S2  Respiratory: clear bilaterally, No wheezing, rales or rhonchi Abdomen: soft, non tender, no distention , bowel sounds normal Musculoskeletal: 2+ edema b/l Skin: excoriations bilaterally     Scheduled Meds: . acidophilus  2 capsule Oral Q breakfast  . doxycycline  100 mg Oral Q12H  . folic acid  1 mg Oral Daily  . furosemide  20 mg Intravenous BID  . heparin  5,000 Units Subcutaneous 3 times per day  . multivitamin with minerals  1 tablet Oral Daily  . ondansetron  4 mg Oral  TID AC & HS  . pantoprazole  40 mg Oral QAC breakfast  . potassium chloride  40 mEq Oral BID  . sodium chloride flush  10-40 mL Intracatheter Q12H  . sodium chloride flush  3 mL Intravenous Q12H  . thiamine  100 mg Oral Daily   Or  . thiamine  100 mg Intravenous Daily   Continuous Infusions:    Principal Problem:   Lower extremity cellulitis Active Problems:   Alcohol dependence (HCC)   Depression   Gastric bypass status for obesity   Elevated LFTs    Tobacco use disorder   Alcoholic hepatitis   Hypotension   Anemia   Thrombocytopenia (HCC)   Alcoholic hepatitis without ascites   Time spent 25 minutes    By signing my name below, I, Adron Bene, attest that this documentation has been prepared under the direction and in the presence of Erick Blinks, MD. Electronically Signed: Adron Bene 10/06/2015 11:45am  I, Dr. Erick Blinks, personally performed the services described in this documentaiton. All medical record entries made by the scribe were at my direction and in my presence. I have reviewed the chart and agree that the record reflects my personal performance and is accurate and complete  Erick Blinks, MD, 10/06/2015 11:52 AM

## 2015-10-07 LAB — BASIC METABOLIC PANEL
Anion gap: 7 (ref 5–15)
BUN: 5 mg/dL — AB (ref 6–20)
CALCIUM: 7.2 mg/dL — AB (ref 8.9–10.3)
CHLORIDE: 106 mmol/L (ref 101–111)
CO2: 26 mmol/L (ref 22–32)
CREATININE: 1.04 mg/dL — AB (ref 0.44–1.00)
GFR calc non Af Amer: >60 mL/min (ref >60)
Glucose, Bld: 88 mg/dL (ref 65–99)
POTASSIUM: 3.7 mmol/L (ref 3.5–5.1)
Sodium: 139 mmol/L (ref 135–145)

## 2015-10-07 MED ORDER — ALBUMIN HUMAN 25 % IV SOLN
25.0000 g | Freq: Two times a day (BID) | INTRAVENOUS | Status: AC
  Administered 2015-10-08 – 2015-10-09 (×4): 25 g via INTRAVENOUS
  Filled 2015-10-07 (×4): qty 100

## 2015-10-07 MED ORDER — NYSTATIN 100000 UNIT/GM EX POWD
Freq: Three times a day (TID) | CUTANEOUS | Status: DC
  Administered 2015-10-07 – 2015-10-08 (×3): via TOPICAL
  Administered 2015-10-08: 1 via TOPICAL
  Administered 2015-10-09 – 2015-10-17 (×25): via TOPICAL
  Filled 2015-10-07 (×5): qty 15

## 2015-10-07 NOTE — Progress Notes (Signed)
PROGRESS NOTE  Jennifer [name] ZOX:[id number] DOB: 1971/04/09 DOA: 09/28/2015 [facility]  Assessment/Plan: 1. Bilateral lower extremity cellulitis, appears resolved with empiric antibiotic therapy. Early sepsis considered on admission with elevated lactic acid and hypotension. Elevated lactic acid likely more related to dehydration, malnutrition and poor oral intake. Blood cultures and urine culture negative. 2. Elevated LFTs secondary to acute on chronic alcoholic hepatitis in underlying severe diffuse hepatic steatosis seen on CT 08/2015. Improving. Steroids contraindicated secondary to infection per GI. 3. Anasarca. Secondary to poor nutrition, hypoalbuminemia. Echocardiogram unremarkable. Pt reports that her urine output has decreased and still has evidence of significant volume overload. Her Lasix dose has been increased, but urine output is unimpressive and creatinine is starting to trend up. Will add albumin infusions to aide in diuresis. 4. Normocytic anemia, thrombocytopenia. Suspect secondary to alcohol use. Hgb 7.9. INR normal 1.60. No evidence of bleeding.  5. Alcohol dependence. Drinks 1 pint of vodka daily. No evidence of withdrawal.  6. PMH gastric bypass, pancreatitis  7. Depression  8. Tobacco use disorder.    Code Status: Full DVT prophylaxis: Heparin Family Communication: No family present at bedside  Disposition Plan: Home likely within 1-2 days  Erick Blinks, MD  [facility]  Pager [phone] If 7PM-7AM, please contact night-coverage at [email or web address], password [email or web address] 10/07/2015, 7:22 AM  LOS: 9 days   Consultants:  none  Procedures:  ECHO Study Conclusions  - Left ventricle: The cavity size was normal. Wall thickness was  normal. Systolic function was vigorous. The estimated ejection  fraction was in the range of 65% to 70%. Left ventricular  diastolic function parameters were normal. - Aortic valve: Valve area (VTI): 2.55 cm^2.  Valve area (Vmax):  2.44 cm^2. - Technically difficult study.  Antibiotics:  Zosyn 3/16>>3/20  Vanc 3/16 >>3/20  Doxycycline 3/20>>  HPI/Subjective: Does not feel well. Feels that swelling in legs is not improving., complaining of skin break down in her gluteal region  Objective: Filed Vitals:   10/06/15 1740 10/06/15 2120 10/07/15 0001 10/07/15 0612  BP: 118/62 98/50 101/53   Pulse: 122 100 104   Temp: 98.8 F (37.1 C) 98.3 F (36.8 C) 98.6 F (37 C)   TempSrc: Oral Oral Oral   Resp: Height:      Weight:    86.8 kg (191 lb 5.8 oz)  SpO2: 100% 99% 98%     Intake/Output Summary (Last 24 hours) at 10/07/15 8295 Last data filed at 10/06/15 1854  Gross per 24 hour  Intake    730 ml  Output    600 ml  Net    130 ml     Filed Weights   10/05/15 0400 10/06/15 0600 10/07/15 0612  Weight: 85.14 kg (187 lb 11.2 oz) 86.728 kg (191 lb 3.2 oz) 86.8 kg (191 lb 5.8 oz)    Exam  General: NAD, looks comfortable Cardiovascular: RRR, S1, S2  Respiratory: clear bilaterally, No wheezing, rales or rhonchi Abdomen: soft, non tender, no distention , bowel sounds normal Musculoskeletal: 2-3+ edema b/l Skin: excoriations bilaterally     Scheduled Meds: . acidophilus  2 capsule Oral Q breakfast  . doxycycline  100 mg Oral Q12H  . folic acid  1 mg Oral Daily  . furosemide  40 mg Intravenous BID  . heparin  5,000 Units Subcutaneous 3 times per day  . multivitamin with minerals  1 tablet Oral Daily  . ondansetron  4 mg Oral TID AC &  HS  . pantoprazole  40 mg Oral QAC breakfast  . potassium chloride  40 mEq Oral BID  . sodium chloride flush  10-40 mL Intracatheter Q12H  . sodium chloride flush  3 mL Intravenous Q12H  . thiamine  100 mg Oral Daily   Or  . thiamine  100 mg Intravenous Daily   Continuous Infusions:    Principal Problem:   Lower extremity cellulitis Active Problems:   Alcohol dependence (HCC)   Depression   Gastric bypass status for obesity    Elevated LFTs   Tobacco use disorder   Alcoholic hepatitis   Hypotension   Anemia   Thrombocytopenia (HCC)   Alcoholic hepatitis without ascites   Time spent 25 minutes

## 2015-10-08 LAB — BASIC METABOLIC PANEL
ANION GAP: 8 (ref 5–15)
BUN: 4 mg/dL — ABNORMAL LOW (ref 6–20)
CALCIUM: 7.6 mg/dL — AB (ref 8.9–10.3)
CHLORIDE: 106 mmol/L (ref 101–111)
CO2: 25 mmol/L (ref 22–32)
Creatinine, Ser: 1.05 mg/dL — ABNORMAL HIGH (ref 0.44–1.00)
GFR calc non Af Amer: >60 mL/min (ref >60)
Glucose, Bld: 84 mg/dL (ref 65–99)
POTASSIUM: 3.9 mmol/L (ref 3.5–5.1)
Sodium: 139 mmol/L (ref 135–145)

## 2015-10-08 MED ORDER — ALBUMIN HUMAN 25 % IV SOLN
INTRAVENOUS | Status: AC
  Filled 2015-10-08: qty 50

## 2015-10-08 MED ORDER — METOLAZONE 5 MG PO TABS
5.0000 mg | ORAL_TABLET | Freq: Every day | ORAL | Status: DC
  Administered 2015-10-08 – 2015-10-13 (×6): 5 mg via ORAL
  Filled 2015-10-08 (×6): qty 1

## 2015-10-08 NOTE — Progress Notes (Signed)
BP 95/53. Zaroxolyn due. MD notified/aware. Ordered to give medication. Will recheck BP after. Lesly Dukesachel J Everett, RN

## 2015-10-08 NOTE — Progress Notes (Signed)
PROGRESS NOTE  Jennifer [name] [id number] DOB: 20-Aug-1970 DOA: 09/28/2015 [facility]  Assessment/Plan: 1. Bilateral lower extremity cellulitis, appears resolved with empiric antibiotic therapy. Early sepsis considered on admission with elevated lactic acid and hypotension. Elevated lactic acid likely more related to dehydration, malnutrition and poor oral intake. Blood cultures and urine culture negative. Will discontinue doxycycline.  2. Elevated LFTs secondary to acute on chronic alcoholic hepatitis in underlying severe diffuse hepatic steatosis seen on CT 08/2015. Improving. Steroids contraindicated secondary to infection per GI. 3. Anasarca. Secondary to poor nutrition, hypoalbuminemia. Echocardiogram unremarkable. Her urine output has remained unimpressive. She continues to drink more fluids than recommended and still has evidence of significant volume overload. Will place her on fluid restriction. Continue on IV lasix. Will continue albumin infusions to aide in diuresis. Will start on metolozone. 4. Normocytic anemia, thrombocytopenia. Suspect secondary to alcohol use. Hgb 7.9. INR normal 1.60. No evidence of bleeding.  5. Alcohol dependence. Drinks 1 pint of vodka daily. No evidence of withdrawal.  6. PMH gastric bypass, pancreatitis  7. Depression  8. Tobacco use disorder.    Code Status: Full DVT prophylaxis: Heparin Family Communication: Discussed with patient and family  Disposition Plan: Discharge home once improved   Consultants:  none  Procedures:  ECHO Study Conclusions  - Left ventricle: The cavity size was normal. Wall thickness was  normal. Systolic function was vigorous. The estimated ejection  fraction was in the range of 65% to 70%. Left ventricular  diastolic function parameters were normal. - Aortic valve: Valve area (VTI): 2.55 cm^2. Valve area (Vmax):  2.44 cm^2. - Technically difficult study.  Antibiotics:  Zosyn  3/16>>3/20  Vanc 3/16 >>3/20  Doxycycline 3/20>>3/26  HPI/Subjective: Feels good today. Has noticed weeping from lower extremity edema. She continues to drink lots of fluids.   Objective: Filed Vitals:   10/07/15 1600 10/07/15 2116 10/08/15 0019 10/08/15 0419  BP: 105/54 93/49 103/66 105/65  Pulse: 99 97 99 101  Temp:  98.2 F (36.8 C) 98.3 F (36.8 C) 98.7 F (37.1 C)  TempSrc:  Oral Oral Oral  Resp:  21 20 18   Height:      Weight:    82.555 kg (182 lb)  SpO2:  100% 98% 100%    Intake/Output Summary (Last 24 hours) at 10/08/15 0723 Last data filed at 10/07/15 1900  Gross per 24 hour  Intake    480 ml  Output    700 ml  Net   -220 ml     Filed Weights   10/06/15 0600 10/07/15 0612 10/08/15 0419  Weight: 86.728 kg (191 lb 3.2 oz) 86.8 kg (191 lb 5.8 oz) 82.555 kg (182 lb)    Exam  General: NAD, looks comfortable Cardiovascular: RRR, S1, S2  Respiratory: clear bilaterally, No wheezing, rales or rhonchi Abdomen: soft, non tender, no distention , bowel sounds normal Musculoskeletal: 2+ edema BLE  Skin: excoriations bilaterally     Scheduled Meds: . acidophilus  2 capsule Oral Q breakfast  . albumin human  25 g Intravenous BID  . doxycycline  100 mg Oral Q12H  . folic acid  1 mg Oral Daily  . furosemide  40 mg Intravenous BID  . heparin  5,000 Units Subcutaneous 3 times per day  . multivitamin with minerals  1 tablet Oral Daily  . nystatin   Topical TID  . ondansetron  4 mg Oral TID AC & HS  . pantoprazole  40 mg Oral QAC breakfast  .  potassium chloride  40 mEq Oral BID  . sodium chloride flush  10-40 mL Intracatheter Q12H  . sodium chloride flush  3 mL Intravenous Q12H  . thiamine  100 mg Oral Daily   Or  . thiamine  100 mg Intravenous Daily   Continuous Infusions:    Principal Problem:   Lower extremity cellulitis Active Problems:   Alcohol dependence (HCC)   Depression   Gastric bypass status for obesity   Elevated LFTs   Tobacco use  disorder   Alcoholic hepatitis   Hypotension   Anemia   Thrombocytopenia (HCC)   Alcoholic hepatitis without ascites   Time spent 25 minutes    Erick Blinks, MD  [facility]  Pager [phone] If 7PM-7AM, please contact night-coverage at [email or web address], password [facility] 10/08/2015, 7:23 AM  LOS: 10 days    By signing my name below, I, Adron Bene, attest that this documentation has been prepared under the direction and in the presence of Erick Blinks, MD. Electronically Signed: Adron Bene 10/08/2015 1:12pm  I, Dr. Erick Blinks, personally performed the services described in this documentaiton. All medical record entries made by the scribe were at my direction and in my presence. I have reviewed the chart and agree that the record reflects my personal performance and is accurate and complete  Erick Blinks, MD, 10/08/2015 1:41 PM

## 2015-10-08 NOTE — Progress Notes (Addendum)
Recheck BP. BP 92/50. 40 mg of lasix due. MD notified and aware. MD ordered to give lasix IV. Lesly Dukesachel J Everett, RN

## 2015-10-09 LAB — COMPREHENSIVE METABOLIC PANEL
ALK PHOS: 111 U/L (ref 38–126)
ALT: 26 U/L (ref 14–54)
AST: 55 U/L — ABNORMAL HIGH (ref 15–41)
Albumin: 2.4 g/dL — ABNORMAL LOW (ref 3.5–5.0)
Anion gap: 10 (ref 5–15)
BILIRUBIN TOTAL: 6.8 mg/dL — AB (ref 0.3–1.2)
BUN: <5 mg/dL — ABNORMAL LOW (ref 6–20)
CALCIUM: 7.8 mg/dL — AB (ref 8.9–10.3)
CO2: 27 mmol/L (ref 22–32)
CREATININE: 1.06 mg/dL — AB (ref 0.44–1.00)
Chloride: 103 mmol/L (ref 101–111)
Glucose, Bld: 96 mg/dL (ref 65–99)
Potassium: 3.2 mmol/L — ABNORMAL LOW (ref 3.5–5.1)
Sodium: 140 mmol/L (ref 135–145)
TOTAL PROTEIN: 4.5 g/dL — AB (ref 6.5–8.1)

## 2015-10-09 MED ORDER — ALBUMIN HUMAN 25 % IV SOLN
25.0000 g | Freq: Two times a day (BID) | INTRAVENOUS | Status: DC
  Filled 2015-10-09 (×3): qty 100

## 2015-10-09 MED ORDER — POTASSIUM CHLORIDE CRYS ER 20 MEQ PO TBCR
40.0000 meq | EXTENDED_RELEASE_TABLET | Freq: Once | ORAL | Status: AC
  Administered 2015-10-09: 40 meq via ORAL
  Filled 2015-10-09: qty 2

## 2015-10-09 MED ORDER — ALBUMIN HUMAN 25 % IV SOLN
25.0000 g | Freq: Two times a day (BID) | INTRAVENOUS | Status: AC
  Administered 2015-10-09 – 2015-10-11 (×4): 25 g via INTRAVENOUS
  Filled 2015-10-09 (×4): qty 100

## 2015-10-09 NOTE — Progress Notes (Signed)
Physical Therapy Treatment Patient Details Name: Jennifer [name] MRN: [id number] DOB: 1971-03-13 Today's Date: 10/09/2015    History of Present Illness HPI: this is a 45 y/o female with h/o alcohol abuse, tobacco abuse and recent pancreatitis. She has chronic LE edema. She was in the Er 09/18/15 with c/o LE rash and UTI. She was d/c on Keflex which she has been taking. Two days ago she developed worsening rash on legs, splotchy redness, itching, blistering on the legs. She states it hurt to walk on them she c/o pins and needle sensation on her feet. Today she developed a mild similar rash on her wrists. She is unclear if she has had any fevers but reports chills. She denies nausea or vomiting. In the ER the patient was transiently hypotensive with a systolic blood pressure down to 84. She received a liter NS bolus and her blood pressure rebounded. total fluid given in ER 2 [name]    PT Comments    Pt reports feeling better in general.  She was agreeable to ambulation in the hallway.  She has received her own walker and this was adjusted to her height.  She was instructed in its' use and was able to ambulate 200' with a very stable gait pattern.  She leaned quite heavily on the walker and she was able to modify this and use more LE power by decreasing this burden on UEs.  She was agreeable to sitting up in a recliner this afternoon.  Follow Up Recommendations  Home health PT     Equipment Recommendations  Rolling walker with 5" wheels    Recommendations for Other Services  none     Precautions / Restrictions Precautions Precautions: None Restrictions Weight Bearing Restrictions: No    Mobility  Bed Mobility Overal bed mobility: Modified Independent                Transfers Overall transfer level: Modified independent Equipment used: Rolling walker (2 wheeled) Transfers: Sit to/from Stand Sit to Stand: Supervision Stand pivot transfers: Supervision       General transfer  comment: very stable now with transfer  Ambulation/Gait Ambulation/Gait assistance: Supervision Ambulation Distance (Feet): 200 Feet Assistive device: Rolling walker (2 wheeled) Gait Pattern/deviations: WFL(Within Functional Limits)   Gait velocity interpretation: <1.8 ft/sec, indicative of risk for recurrent falls General Gait Details: gait is stable with a walker...she was instructed in decreasing weight on the UEs/walker and thus having LEs do more work. ...she was successful with this.   Stairs            Wheelchair Mobility    Modified Rankin (Stroke Patients Only)       Balance     Sitting balance-Leahy Scale: Good     Standing balance support: Bilateral upper extremity supported Standing balance-Leahy Scale: Good                      Cognition Arousal/Alertness: Awake/alert Behavior During Therapy: WFL for tasks assessed/performed Overall Cognitive Status: Within Functional Limits for tasks assessed                      Exercises      General Comments        Pertinent Vitals/Pain Pain Assessment: No/denies pain    Home Living                      Prior Function  PT Goals (current goals can now be found in the care plan section) Progress towards PT goals: Progressing toward goals    Frequency  Min 2X/week    PT Plan Current plan remains appropriate;Frequency needs to be updated    Co-evaluation             End of Session Equipment Utilized During Treatment: Gait belt Activity Tolerance: Patient tolerated treatment well Patient left: in chair;with call bell/phone within reach     Time: 1512-1540 PT Time Calculation (min) (ACUTE ONLY): 28 min  Charges:  $Gait Training: 8-22 mins                    G CodesMyrlene Broker [name]  PT 10/09/2015, 4:09 PM

## 2015-10-09 NOTE — Progress Notes (Signed)
PROGRESS NOTE  Albesa Seenarry L Pless ZOX:[id number] DOB: 02/06/1971 DOA: 09/28/2015 [facility]  Assessment/Plan: 1. Bilateral lower extremity cellulitis, appears resolved with empiric antibiotic therapy. Early sepsis considered on admission with elevated lactic acid and hypotension. Elevated lactic acid likely more related to dehydration, malnutrition and poor oral intake. Blood cultures and urine culture negative. Abx discontinued. 2. Elevated LFTs secondary to acute on chronic alcoholic hepatitis in underlying severe diffuse hepatic steatosis  seen on CT 08/2015. Improving. Steroids contraindicated secondary to infection per GI. 3. Anasarca. Secondary to poor nutrition, hypoalbuminemia. Continues to improve. Echocardiogram unremarkable. She has been placed on fluid restriction and continued on IV lasix. Will continue albumin infusions to aide in diuresis and started on metolozone. 4. Normocytic anemia, thrombocytopenia. Suspect secondary to alcohol use.  No evidence of bleeding.  5. Alcohol dependence. Drinks 1 pint of vodka daily. No evidence of withdrawal.  6. PMH gastric bypass, pancreatitis  7. Depression  8. Tobacco use disorder. Counseled on cessation.   Code Status: Full DVT prophylaxis: Heparin Family Communication: Discussed with patient and family  Disposition Plan: Discharge home once improved   Consultants:  None  Procedures:  ECHO Study Conclusions  - Left ventricle: The cavity size was normal. Wall thickness was  normal. Systolic function was vigorous. The estimated ejection  fraction was in the range of 65% to 70%. Left ventricular  diastolic function parameters were normal. - Aortic valve: Valve area (VTI): 2.55 cm^2. Valve area (Vmax):  2.44 cm^2. - Technically difficult study.  Antibiotics:  Zosyn 3/16>>3/20  Vanc 3/16 >>3/20  Doxycycline 3/20>>3/26  HPI/Subjective: Swelling is about the same although better a few days ago. She  reports her UOP has been consistent.  Objective: Filed Vitals:   10/08/15 2000 10/09/15 0000 10/09/15 0400 10/09/15 0743  BP: 106/72 105/51 104/62 97/55  Pulse: 101 84 93 75  Temp: 98.4 F (36.9 C) 97.9 F (36.6 C) 98.2 F (36.8 C) 98.2 F (36.8 C)  TempSrc: Oral Oral Oral Oral  Resp: 20 18 20 20   Height:      Weight:   80.287 kg (177 lb)   SpO2: 100% 96% 100% 100%    Intake/Output Summary (Last 24 hours) at 10/09/15 0819 Last data filed at 10/09/15 0745  Gross per 24 hour  Intake   1140 ml  Output   2400 ml  Net  -1260 ml     Filed Weights   10/07/15 0612 10/08/15 0419 10/09/15 0400  Weight: 86.8 kg (191 lb 5.8 oz) 82.555 kg (182 lb) 80.287 kg (177 lb)    Exam  General: NAD.   Cardiovascular: RRR, S1, S2   Respiratory: clear bilaterally, No wheezing, rales or rhonchi  Abdomen: soft, non tender, no distention , bowel sounds normal  Musculoskeletal: 1-2+ edema bilaterally, slowly improving.   Scheduled Meds: . acidophilus  2 capsule Oral Q breakfast  . albumin human  25 g Intravenous BID  . folic acid  1 mg Oral Daily  . furosemide  40 mg Intravenous BID  . heparin  5,000 Units Subcutaneous 3 times per day  . metolazone  5 mg Oral Daily  . multivitamin with minerals  1 tablet Oral Daily  . nystatin   Topical TID  . ondansetron  4 mg Oral TID AC & HS  . pantoprazole  40 mg Oral QAC breakfast  . potassium chloride  40 mEq Oral BID  . sodium chloride flush  10-40 mL Intracatheter Q12H  . sodium chloride flush  3 mL Intravenous Q12H  . thiamine  100 mg Oral Daily   Or  . thiamine  100 mg Intravenous Daily   Continuous Infusions:    Principal Problem:   Lower extremity cellulitis Active Problems:   Alcohol dependence (HCC)   Depression   Gastric bypass status for obesity   Elevated LFTs   Tobacco use disorder   Alcoholic hepatitis   Hypotension   Anemia   Thrombocytopenia (HCC)   Alcoholic hepatitis without ascites   Time spent 15 minutes      Erick Blinks, MD  [facility]  Pager [phone] If 7PM-7AM, please contact night-coverage at [email or web address], password [facility] 10/08/2015, 7:23 AM  LOS: 10 days    By signing my name below, I, Zadie Cleverly, attest that this documentation has been prepared under the direction and in the presence of Erick Blinks, MD. Electronically signed: Zadie Cleverly, Scribe. 10/09/2015 11:50am   I, Dr. Erick Blinks, personally performed the services described in this documentaiton. All medical record entries made by the scribe were at my direction and in my presence. I have reviewed the chart and agree that the record reflects my personal performance and is accurate and complete  Erick Blinks, MD, 10/09/2015 12:00 PM

## 2015-10-09 NOTE — Clinical Documentation Improvement (Signed)
Internal Medicine  Please clarify if the following diagnosis, sepsis was:   Present at the time of admission (POA)  NOT present at the time of admission and it developed during the inpatient stay  Unable to clinically determine whether the condition was present on admission.  Unknown  Ruled out   Supporting Information: 09/29/2015 "Early sepsis considered on admission with elevated lactic acid and hypotension. Lactic acid has been relatively flat although is somewhat improved with volume resuscitation. Antibiotics:Zosyn 3/16>>Vanc 3/16 " 10/02/45 OZ:[id number]:cellulitis and decompensated liver disease in setting of ETOH hepatitis/sepsis   Please exercise your independent, professional judgment when responding. A specific answer is not anticipated or expected. Please update your documentation within the medical record to reflect your response to this query. Thank you  Thank Barrie DunkerYou,  Turhan Chill C Leverett [place] [facility] [place] [phone]

## 2015-10-10 LAB — BASIC METABOLIC PANEL
ANION GAP: 11 (ref 5–15)
BUN: <5 mg/dL — ABNORMAL LOW (ref 6–20)
CALCIUM: 7.9 mg/dL — AB (ref 8.9–10.3)
CO2: 28 mmol/L (ref 22–32)
CREATININE: 1.14 mg/dL — AB (ref 0.44–1.00)
Chloride: 99 mmol/L — ABNORMAL LOW (ref 101–111)
GFR calc Af Amer: >60 mL/min (ref >60)
GFR, EST NON AFRICAN AMERICAN: 58 mL/min — AB (ref >60)
Glucose, Bld: 94 mg/dL (ref 65–99)
Potassium: 3 mmol/L — ABNORMAL LOW (ref 3.5–5.1)
Sodium: 138 mmol/L (ref 135–145)

## 2015-10-10 MED ORDER — CAMPHOR-MENTHOL 0.5-0.5 % EX LOTN
TOPICAL_LOTION | CUTANEOUS | Status: DC | PRN
  Administered 2015-10-11: 11:00:00 via TOPICAL
  Filled 2015-10-10: qty 222

## 2015-10-10 MED ORDER — MIDODRINE HCL 5 MG PO TABS
5.0000 mg | ORAL_TABLET | Freq: Three times a day (TID) | ORAL | Status: DC
  Administered 2015-10-10 – 2015-10-17 (×20): 5 mg via ORAL
  Filled 2015-10-10 (×23): qty 1

## 2015-10-10 MED ORDER — POTASSIUM CHLORIDE CRYS ER 20 MEQ PO TBCR
40.0000 meq | EXTENDED_RELEASE_TABLET | ORAL | Status: AC
  Administered 2015-10-10 (×2): 40 meq via ORAL
  Filled 2015-10-10 (×2): qty 2

## 2015-10-10 NOTE — Progress Notes (Signed)
PROGRESS NOTE  Albesa Seenarry L Corvino WUJ:[id number] DOB: 28-Oct-1970 DOA: 09/28/2015 [facility]CP: [facility] [place] [facility]  [age] yof with hx of EtOH abuse presented with LE cellulitis. She was treated with abx and cellulitis has since improved. Pt does have significant (2-3+) edema in BLE. Currently we are trying to diuresis her. Once volume status has improved, she can discharge home. Anticipate discharge in next 2-3 days.   Assessment/Plan: 1. Bilateral lower extremity cellulitis, appears resolved with empiric antibiotic therapy. Early sepsis considered on admission with elevated lactic acid and hypotension. Elevated lactic acid likely more related to dehydration, malnutrition and poor oral intake. Blood cultures and urine culture negative. Abx discontinued. 2. Elevated LFTs secondary to acute on chronic alcoholic hepatitis in underlying severe diffuse hepatic steatosis  seen on CT 08/2015. Improving. Steroids contraindicated secondary to infection per GI. 3. Anasarca. Secondary to poor nutrition, hypoalbuminemia. Continues to slowly improve. Echocardiogram unremarkable. She has been placed on fluid restriction and continued on IV lasix. She still has 2+ edema and needs continue diuresis. Will continue albumin infusions to aide in diuresis and started on metolozone. 4. Normocytic anemia, thrombocytopenia. Suspect secondary to alcohol use.  No evidence of bleeding.  5. Alcohol dependence. Drinks 1 pint of vodka daily. No evidence of withdrawal.  6. PMH gastric bypass, pancreatitis  7. Depression  8. Tobacco use disorder. Counseled on cessation. 9. Hypokalemia, related to diuretics. Will replace.   Code Status: Full DVT prophylaxis: Heparin Family Communication: No family bedside  Disposition Plan: Discharge home once improved  Consultants:  PT- HHPT  Procedures:  ECHO Study Conclusions  - Left ventricle: The cavity size was normal. Wall thickness was  normal. Systolic function was vigorous.  The estimated ejection  fraction was in the range of 65% to 70%. Left ventricular  diastolic function parameters were normal. - Aortic valve: Valve area (VTI): 2.55 cm^2. Valve area (Vmax):  2.44 cm^2. - Technically difficult study.  Antibiotics:  Zosyn 3/16>>3/20  Vanc 3/16 >>3/20  Doxycycline 3/20>>3/26  HPI/Subjective: Feeling a lot better with increased diuresis.   Objective: Filed Vitals:   10/09/15 1040 10/09/15 1452 10/09/15 2100 10/10/15 0500  BP: 106/65 95/54 101/55 89/54  Pulse: 85 85 79 87  Temp: 98.4 F (36.9 C) 98.4 F (36.9 C) 98.2 F (36.8 C) 98.3 F (36.8 C)  TempSrc: Oral Oral Oral Oral  Resp:  20 18 20   Height:      Weight:    81.965 kg (180 lb 11.2 oz)  SpO2:  100% 99% 97%    Intake/Output Summary (Last 24 hours) at 10/10/15 1207 Last data filed at 10/10/15 0900  Gross per 24 hour  Intake    700 ml  Output   2200 ml  Net  -1500 ml     Filed Weights   10/08/15 0419 10/09/15 0400 10/10/15 0500  Weight: 82.555 kg (182 lb) 80.287 kg (177 lb) 81.965 kg (180 lb 11.2 oz)    Exam  General: NAD.   Cardiovascular: RRR, S1, S2   Respiratory: clear bilaterally, No wheezing, rales or rhonchi  Abdomen: soft, non tender, no distention , bowel sounds normal  Musculoskeletal: 2-3+ edema bilaterally, slowly improving.   Scheduled Meds: . acidophilus  2 capsule Oral Q breakfast  . albumin human  25 g Intravenous BID  . folic acid  1 mg Oral Daily  . furosemide  40 mg Intravenous BID  . heparin  5,000 Units Subcutaneous 3 times per day  . metolazone  5 mg Oral Daily  .  multivitamin with minerals  1 tablet Oral Daily  . nystatin   Topical TID  . ondansetron  4 mg Oral TID AC & HS  . pantoprazole  40 mg Oral QAC breakfast  . potassium chloride  40 mEq Oral BID  . sodium chloride flush  10-40 mL Intracatheter Q12H  . sodium chloride flush  3 mL Intravenous Q12H  . thiamine  100 mg Oral Daily   Or  . thiamine  100 mg Intravenous Daily    Continuous Infusions:    Principal Problem:   Lower extremity cellulitis Active Problems:   Alcohol dependence (HCC)   Depression   Gastric bypass status for obesity   Elevated LFTs   Tobacco use disorder   Alcoholic hepatitis   Hypotension   Anemia   Thrombocytopenia (HCC)   Alcoholic hepatitis without ascites   Time spent 15 minutes    Erick Blinks, MD  [facility]  Pager [phone] If 7PM-7AM, please contact night-coverage at [email or web address], password [email or web address] 10/08/2015, 7:23 AM  LOS: 10 days    By signing my name below, I, Zadie Cleverly, attest that this documentation has been prepared under the direction and in the presence of Erick Blinks, MD. Electronically signed: Zadie Cleverly, Scribe. 10/10/2015 12:05pm  I, Dr. Erick Blinks, personally performed the services described in this documentaiton. All medical record entries made by the scribe were at my direction and in my presence. I have reviewed the chart and agree that the record reflects my personal performance and is accurate and complete  Erick Blinks, MD, 10/10/2015 12:18 PM

## 2015-10-11 DIAGNOSIS — F17200 Nicotine dependence, unspecified, uncomplicated: Secondary | ICD-10-CM

## 2015-10-11 LAB — PREPARE RBC (CROSSMATCH)

## 2015-10-11 LAB — COMPREHENSIVE METABOLIC PANEL
ALBUMIN: 3 g/dL — AB (ref 3.5–5.0)
ALK PHOS: 101 U/L (ref 38–126)
ALT: 26 U/L (ref 14–54)
AST: 73 U/L — ABNORMAL HIGH (ref 15–41)
Anion gap: 12 (ref 5–15)
BILIRUBIN TOTAL: 8.3 mg/dL — AB (ref 0.3–1.2)
CALCIUM: 7.9 mg/dL — AB (ref 8.9–10.3)
CO2: 28 mmol/L (ref 22–32)
CREATININE: 1.12 mg/dL — AB (ref 0.44–1.00)
Chloride: 99 mmol/L — ABNORMAL LOW (ref 101–111)
GFR calc Af Amer: >60 mL/min (ref >60)
GFR calc non Af Amer: 59 mL/min — ABNORMAL LOW (ref >60)
GLUCOSE: 94 mg/dL (ref 65–99)
Potassium: 3.1 mmol/L — ABNORMAL LOW (ref 3.5–5.1)
SODIUM: 139 mmol/L (ref 135–145)
TOTAL PROTEIN: 5.1 g/dL — AB (ref 6.5–8.1)

## 2015-10-11 LAB — HEMOGLOBIN AND HEMATOCRIT, BLOOD
HEMATOCRIT: 25.3 % — AB (ref 36.0–46.0)
HEMOGLOBIN: 8.7 g/dL — AB (ref 12.0–15.0)

## 2015-10-11 LAB — IRON AND TIBC: Iron: 54 ug/dL (ref 28–170)

## 2015-10-11 LAB — VITAMIN B12: VITAMIN B 12: 3118 pg/mL — AB (ref 180–914)

## 2015-10-11 LAB — RETICULOCYTES
RBC.: 1.81 MIL/uL — AB (ref 3.87–5.11)
RETIC COUNT ABSOLUTE: 54.3 10*3/uL (ref 19.0–186.0)
RETIC CT PCT: 3 % (ref 0.4–3.1)

## 2015-10-11 LAB — CBC
HCT: 18.1 % — ABNORMAL LOW (ref 36.0–46.0)
Hemoglobin: 6.4 g/dL — CL (ref 12.0–15.0)
MCH: 34.6 pg — AB (ref 26.0–34.0)
MCHC: 35.4 g/dL (ref 30.0–36.0)
MCV: 97.8 fL (ref 78.0–100.0)
PLATELETS: 109 10*3/uL — AB (ref 150–400)
RBC: 1.85 MIL/uL — ABNORMAL LOW (ref 3.87–5.11)
RDW: 20.5 % — AB (ref 11.5–15.5)
WBC: 5.9 10*3/uL (ref 4.0–10.5)

## 2015-10-11 LAB — FERRITIN: Ferritin: 45 ng/mL (ref 11–307)

## 2015-10-11 LAB — FOLATE: FOLATE: 30.4 ng/mL (ref >5.9)

## 2015-10-11 MED ORDER — FUROSEMIDE 10 MG/ML IJ SOLN
20.0000 mg | Freq: Once | INTRAMUSCULAR | Status: AC
  Administered 2015-10-11: 20 mg via INTRAVENOUS
  Filled 2015-10-11: qty 2

## 2015-10-11 MED ORDER — SODIUM CHLORIDE 0.9 % IV SOLN: Freq: Once | INTRAVENOUS | Status: DC

## 2015-10-11 MED ORDER — PANTOPRAZOLE SODIUM 40 MG PO TBEC
40.0000 mg | DELAYED_RELEASE_TABLET | Freq: Two times a day (BID) | ORAL | Status: DC
  Administered 2015-10-11 – 2015-10-17 (×12): 40 mg via ORAL
  Filled 2015-10-11 (×14): qty 1

## 2015-10-11 MED ORDER — POTASSIUM CHLORIDE CRYS ER 20 MEQ PO TBCR
40.0000 meq | EXTENDED_RELEASE_TABLET | Freq: Three times a day (TID) | ORAL | Status: DC
Start: 2015-10-11 — End: 2015-10-13
  Administered 2015-10-11 – 2015-10-13 (×7): 40 meq via ORAL
  Filled 2015-10-11 (×6): qty 2

## 2015-10-11 NOTE — Progress Notes (Signed)
Physical Therapy Treatment Patient Details Name: Jennifer [name] L Smart MRN: [id number] DOB: April 23, 1971 Today's Date: 10/11/2015    History of Present Illness HPI: this is a 45 y/o female with h/o alcohol abuse, tobacco abuse and recent pancreatitis. She has chronic LE edema. She was in the Er 09/18/15 with c/o LE rash and UTI. She was d/c on Keflex which she has been taking. Two days ago she developed worsening rash on legs, splotchy redness, itching, blistering on the legs. She states it hurt to walk on them she c/o pins and needle sensation on her feet. Today she developed a mild similar rash on her wrists. She is unclear if she has had any fevers but reports chills. She denies nausea or vomiting. In the ER the patient was transiently hypotensive with a systolic blood pressure down to 84. She received a liter NS bolus and her blood pressure rebounded. total fluid given in ER 2 L    PT Comments    Pt alert and willing to participate with therapy today.  Pt stated she was very tired as a lot of medical personal have been in her room earlier today and has been walking to and from the restroom with the RW in her room.  Pt c/o pain scale 7/10 Bil LE stated they were tight and heavy, has been completeing the ankle pumps in bed through out day.  Gait training complete with RW with SBA and cueing to reduce pressure with UE onto walker and to improve posture.  Pt able to ambulate 200 feet stable with RW.  Pt left in bed upon request of being tired, MD entered room at end of session.  Pt with call bell with reach and heels supported to reduce risk of pressure sores.  No reports of increased pain through session.    Follow Up Recommendations        Equipment Recommendations       Recommendations for Other Services       Precautions / Restrictions Precautions Precautions: None Restrictions Weight Bearing Restrictions: No    Mobility  Bed Mobility Overal bed mobility: Independent                 Transfers Overall transfer level: Modified independent Equipment used: Rolling walker (2 wheeled) Transfers: Sit to/from Stand Sit to Stand: Supervision            Ambulation/Gait Ambulation/Gait assistance: Supervision Ambulation Distance (Feet): 200 Feet Assistive device: Rolling walker (2 wheeled) Gait Pattern/deviations: WFL(Within Functional Limits)     General Gait Details: gait is stable with a walker...she was instructed in decreasing weight on the UEs/walker and thus having LEs do more work. ...she was successful with this.   Stairs            Wheelchair Mobility    Modified Rankin (Stroke Patients Only)       Balance                                    Cognition                            Exercises Total Joint Exercises Ankle Circles/Pumps: AROM;Both;10 reps    General Comments        Pertinent Vitals/Pain Pain Assessment: 0-10 Pain Score: 7  Pain Location: Bil LE Pain Descriptors / Indicators: Aching;Sore;Tender;Tightness Pain Intervention(s): Limited activity within patient's  tolerance    Home Living                      Prior Function            PT Goals (current goals can now be found in the care plan section) Progress towards PT goals: Progressing toward goals    Frequency       PT Plan Current plan remains appropriate    Co-evaluation             End of Session Equipment Utilized During Treatment: Gait belt Activity Tolerance: Patient tolerated treatment well Patient left: in bed;with call bell/phone within reach;with nursing/sitter in room     Time: 1610-9604 PT Time Calculation (min) (ACUTE ONLY): 22 min  Charges:  $Gait Training: 8-22 mins                    G Codes:     Becky Sax, LPTA; CBIS [phone]  Juel Burrow 10/11/2015, 4:38 PM

## 2015-10-11 NOTE — Progress Notes (Signed)
PROGRESS NOTE  Albesa Seenarry L Hafen ZOX:[id number] DOB: 11/20/1970 DOA: 09/28/2015 PCP: [facility] [place] [facility]  [age] yof with hx of EtOH abuse presented with LE cellulitis. She was treated with abx and cellulitis has since improved. Pt does have significant (2-3+) edema in BLE. Currently we are trying to diuresis her. Her H/H has been low but not low enough to require transfusion until 10/11/15.   Assessment/Plan: Bilateral lower extremity cellulitis Patient was started on broad-spectrum antibiotics with Zosyn and vancomycin for several days. As her cellulitis regressed, Zosyn and vancomycin were discontinued in favor of doxycycline. The course of doxycycline was completed. -There still appears to be some mild erythema of both legs, but this is likely related to the edema. -Continue to provide supportive treatment.   Early sepsis considered on admission with elevated lactic acid and hypotension.  Elevated lactic acid was likely more related to dehydration, malnutrition and poor oral intake. Blood cultures and urine culture were negative. Antibiotics as above were given and discontinued.  Macrocytic-normocytic anemia. Patient's hemoglobin was 9.3 on admission. It has drifted down gradually and was 6.4 on 10/11/15. Patient denies current rectal bleeding or black Tamela stools, although she admits that she does not look at her stools each time she has a bowel movement. -Anemia panel ordered. Hemoccult of her stools were ordered. -We'll transfuse 2 units of packed red blood cells. We'll give her Lasix between the units. -We'll discontinue IV heparin and start SCDs for DVT prophylaxis. -We will increase Protonix to twice a day. -Gastroenterology was consulted earlier on during the hospitalization. Will reconsult in light of her hemoglobin drifting down to 6.4.  Thrombocytopenia  Patient's platelet count was 123 on admission. 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 Her platelet count has drifted down to 109. -Vitamin B12 and TSH will be ordered for further evaluation.  Elevated LFTs with hyperbilirubinemia secondary to acute on chronic alcoholic hepatitis in underlying severe diffuse hepatic steatosis  seen on CT 08/2015.  Gastroenterology was consulted. Steroids contraindicated secondary to infection per GI. Probiotic recommended daily. -Patient's LFTs have drifted down, but she still has hyperbilirubinemia. -Continue supportive treatment.  Anasarca. Secondary to poor nutrition, hypoalbuminemia.  Patient presented with global anasarca and 3-4+ bilateral lower extremity pitting edema.  2-D echocardiogram ordered and revealed preserved left ventricular EF and no significant diastolic dysfunction. -She was started on IV Lasix and fluid restriction. Albumin was ordered to aid with diuresis and to attempt to replace blood albumin for oncotic pressure. -Metolazone was recently started. -There have been some decrease in her peripheral edema per patient and hospitalist colleague.   Alcohol dependence/abuse Patient drinks 1 pint of vodka daily. CIWA protocol and vitamin therapy were ordered. She was strongly advised to stop drinking completely. There has been no evidence of alcohol withdrawal.   Tobacco use disorder. Counseled on cessation.  Hypokalemia, secondary to diuretic therapy. Potassium chloride started. The dose was increased due to the addition of IV Lasix between packed red blood cells and the addition of metolazone.    Code Status: Full DVT prophylaxis: Heparin Family Communication: No family bedside  Disposition Plan: Discharge home once improved  Consultants:  PT- HHPT  Gastroenterology, signed off; reconsult for 10/12/15.  Procedures:  ECHO Study Conclusions - Left ventricle: The cavity size was normal. Wall thickness was  normal. Systolic function was vigorous. The estimated ejection  fraction was in the range of 65% to  70%. Left ventricular  diastolic function parameters were normal. - Aortic  valve: Valve area (VTI): 2.55 cm^2. Valve area (Vmax):  2.44 cm^2. - Technically difficult study.  Antibiotics:  Zosyn 3/16>>3/20  Vanc 3/16 >>3/20  Doxycycline 3/20>>3/26  HPI/Subjective: Patient reports occasional black stools and occasional red streaks in her stool at home, but she has not noticed any bleeding during the hospitalization. She complains of leg tightness which causes more pain when she tries to ambulate.  Objective: Filed Vitals:   10/11/15 1042 10/11/15 1245 10/11/15 1310 10/11/15 1543  BP: 110/65 100/53 109/57 95/51  Pulse: 96 93 100 78  Temp: 98.3 F (36.8 C) 98.3 F (36.8 C) 98.2 F (36.8 C) 98.1 F (36.7 C)  TempSrc: Oral Oral Oral Oral  Resp: Height:      Weight:      SpO2: 98% 97% 97% 98%    Intake/Output Summary (Last 24 hours) at 10/11/15 1612 Last data filed at 10/11/15 1255  Gross per 24 hour  Intake    596 ml  Output   2301 ml  Net  -1705 ml     Filed Weights   10/09/15 0400 10/10/15 0500 10/11/15 0609  Weight: 80.287 kg (177 lb) 81.965 kg (180 lb 11.2 oz) 78.835 kg (173 lb 12.8 oz)    Exam  General:45 year old Caucasian woman who appears to be older than her stated age.   Cardiovascular: S1, S2, no murmurs rubs or gallops.   Respiratory: Clear anteriorly with decreased breath sounds in the bases.  Abdomen: Positive bowel sounds, soft, nontender, nondistended.  Musculoskeletal: 3+ bilateral lower extremity edema, pitting.  Neurologic: She is alert and oriented 3. Cranial nerves II through XII are grossly intact.   Scheduled Meds: . sodium chloride   Intravenous Once  . acidophilus  2 capsule Oral Q breakfast  . folic acid  1 mg Oral Daily  . furosemide  40 mg Intravenous BID  . heparin  5,000 Units Subcutaneous 3 times per day  . metolazone  5 mg Oral Daily  . midodrine  5 mg Oral TID WC  . multivitamin with minerals  1 tablet  Oral Daily  . nystatin   Topical TID  . ondansetron  4 mg Oral TID AC & HS  . pantoprazole  40 mg Oral QAC breakfast  . potassium chloride  40 mEq Oral TID  . sodium chloride flush  10-40 mL Intracatheter Q12H  . sodium chloride flush  3 mL Intravenous Q12H  . thiamine  100 mg Oral Daily   Or  . thiamine  100 mg Intravenous Daily   Continuous Infusions:    Principal Problem:   Lower extremity cellulitis Active Problems:   Alcohol dependence (HCC)   Depression   Gastric bypass status for obesity   Elevated LFTs   Tobacco use disorder   Alcoholic hepatitis   Hypotension   Anemia   Thrombocytopenia (HCC)   Alcoholic hepatitis without ascites   Time spent 35 minutes    Elliot Cousin, M.D.  [facility]  Pager : [phone]  If 7PM-7AM, please contact night-coverage at [email or web address], password [facility] 10/08/2015, 7:23 AM  LOS: 10 days

## 2015-10-11 NOTE — Plan of Care (Signed)
Report given to Davy PiqueMary Ann Dickerson RN. Bernette MayersDonna Tramell Piechota SN [facility]

## 2015-10-11 NOTE — Plan of Care (Signed)
Report received. Assume care of pt. Jennifer [name] SN [facility] 

## 2015-10-12 ENCOUNTER — Encounter (HOSPITAL_COMMUNITY): Payer: Self-pay | Admitting: *Deleted

## 2015-10-12 ENCOUNTER — Inpatient Hospital Stay (HOSPITAL_COMMUNITY): Payer: Medicaid Other | Admitting: Anesthesiology

## 2015-10-12 ENCOUNTER — Encounter (HOSPITAL_COMMUNITY)
Admission: EM | Disposition: A | Discharge status: Home / Self Care | Payer: Self-pay | Source: Home / Self Care | Attending: Internal Medicine

## 2015-10-12 DIAGNOSIS — R7989 Other specified abnormal findings of blood chemistry: Secondary | ICD-10-CM

## 2015-10-12 DIAGNOSIS — D649 Anemia, unspecified: Secondary | ICD-10-CM

## 2015-10-12 DIAGNOSIS — Z9884 Bariatric surgery status: Secondary | ICD-10-CM

## 2015-10-12 DIAGNOSIS — K3189 Other diseases of stomach and duodenum: Secondary | ICD-10-CM | POA: Diagnosis present

## 2015-10-12 DIAGNOSIS — K259 Gastric ulcer, unspecified as acute or chronic, without hemorrhage or perforation: Secondary | ICD-10-CM

## 2015-10-12 HISTORY — DX: Gastric ulcer, unspecified as acute or chronic, without hemorrhage or perforation: K25.9

## 2015-10-12 HISTORY — PX: ESOPHAGOGASTRODUODENOSCOPY (EGD) WITH PROPOFOL: SHX5813

## 2015-10-12 HISTORY — PX: ESOPHAGEAL BANDING: SHX5518

## 2015-10-12 LAB — HEPATIC FUNCTION PANEL
ALBUMIN: 3.1 g/dL — AB (ref 3.5–5.0)
ALT: 29 U/L (ref 14–54)
AST: 69 U/L — AB (ref 15–41)
Alkaline Phosphatase: 100 U/L (ref 38–126)
BILIRUBIN TOTAL: 12.6 mg/dL — AB (ref 0.3–1.2)
Bilirubin, Direct: 6.7 mg/dL — ABNORMAL HIGH (ref 0.1–0.5)
Indirect Bilirubin: 5.9 mg/dL — ABNORMAL HIGH (ref 0.3–0.9)
TOTAL PROTEIN: 5.4 g/dL — AB (ref 6.5–8.1)

## 2015-10-12 LAB — PROTIME-INR
INR: 1.95 — ABNORMAL HIGH (ref 0.00–1.49)
PROTHROMBIN TIME: 22.1 s — AB (ref 11.6–15.2)

## 2015-10-12 LAB — CBC
HEMATOCRIT: 23.4 % — AB (ref 36.0–46.0)
HEMOGLOBIN: 8.1 g/dL — AB (ref 12.0–15.0)
MCH: 32.1 pg (ref 26.0–34.0)
MCHC: 34.6 g/dL (ref 30.0–36.0)
MCV: 92.9 fL (ref 78.0–100.0)
Platelets: 112 10*3/uL — ABNORMAL LOW (ref 150–400)
RBC: 2.52 MIL/uL — AB (ref 3.87–5.11)
RDW: 21.8 % — ABNORMAL HIGH (ref 11.5–15.5)
WBC: 7 10*3/uL (ref 4.0–10.5)

## 2015-10-12 LAB — MAGNESIUM: Magnesium: 1.4 mg/dL — ABNORMAL LOW (ref 1.7–2.4)

## 2015-10-12 LAB — BASIC METABOLIC PANEL
ANION GAP: 11 (ref 5–15)
BUN: 5 mg/dL — ABNORMAL LOW (ref 6–20)
CO2: 30 mmol/L (ref 22–32)
Calcium: 7.9 mg/dL — ABNORMAL LOW (ref 8.9–10.3)
Chloride: 99 mmol/L — ABNORMAL LOW (ref 101–111)
Creatinine, Ser: 1.09 mg/dL — ABNORMAL HIGH (ref 0.44–1.00)
GFR calc Af Amer: >60 mL/min (ref >60)
GLUCOSE: 75 mg/dL (ref 65–99)
POTASSIUM: 3.1 mmol/L — AB (ref 3.5–5.1)
Sodium: 140 mmol/L (ref 135–145)

## 2015-10-12 LAB — TSH: TSH: 6.516 u[IU]/mL — ABNORMAL HIGH (ref 0.350–4.500)

## 2015-10-12 LAB — HCG, SERUM, QUALITATIVE: PREG SERUM: NEGATIVE

## 2015-10-12 SURGERY — ESOPHAGOGASTRODUODENOSCOPY (EGD) WITH PROPOFOL
Anesthesia: Monitor Anesthesia Care

## 2015-10-12 MED ORDER — FENTANYL CITRATE (PF) 100 MCG/2ML IJ SOLN
25.0000 ug | INTRAMUSCULAR | Status: DC | PRN
  Filled 2015-10-12: qty 2

## 2015-10-12 MED ORDER — LACTATED RINGERS IV SOLN
INTRAVENOUS | Status: DC
  Administered 2015-10-12: 12:00:00 via INTRAVENOUS

## 2015-10-12 MED ORDER — PROPOFOL 10 MG/ML IV BOLUS
INTRAVENOUS | Status: AC
  Filled 2015-10-12: qty 20

## 2015-10-12 MED ORDER — MIDAZOLAM HCL 2 MG/2ML IJ SOLN
INTRAMUSCULAR | Status: AC
  Filled 2015-10-12: qty 2

## 2015-10-12 MED ORDER — POTASSIUM CHLORIDE 10 MEQ/100ML IV SOLN
10.0000 meq | INTRAVENOUS | Status: AC
  Administered 2015-10-12 (×3): 10 meq via INTRAVENOUS
  Filled 2015-10-12 (×3): qty 100

## 2015-10-12 MED ORDER — ONDANSETRON HCL 4 MG/2ML IJ SOLN
4.0000 mg | Freq: Once | INTRAMUSCULAR | Status: AC | PRN
  Administered 2015-10-12: 4 mg via INTRAVENOUS

## 2015-10-12 MED ORDER — LIDOCAINE VISCOUS 2 % MT SOLN
OROMUCOSAL | Status: AC
Start: 2015-10-12 — End: 2015-10-12
  Filled 2015-10-12: qty 15

## 2015-10-12 MED ORDER — PROPOFOL 10 MG/ML IV BOLUS
INTRAVENOUS | Status: DC | PRN
  Administered 2015-10-12 (×10): 25 mg via INTRAVENOUS

## 2015-10-12 MED ORDER — MIDAZOLAM HCL 2 MG/2ML IJ SOLN
1.0000 mg | INTRAMUSCULAR | Status: DC | PRN
  Administered 2015-10-12: 2 mg via INTRAVENOUS

## 2015-10-12 MED ORDER — SUCRALFATE 1 GM/10ML PO SUSP
1.0000 g | Freq: Three times a day (TID) | ORAL | Status: DC
  Administered 2015-10-12 – 2015-10-17 (×20): 1 g via ORAL
  Filled 2015-10-12 (×21): qty 10

## 2015-10-12 MED ORDER — SODIUM CHLORIDE 0.9 % IJ SOLN
INTRAMUSCULAR | Status: DC | PRN
  Administered 2015-10-12: 2 mL

## 2015-10-12 MED ORDER — LIDOCAINE VISCOUS 2 % MT SOLN
15.0000 mL | Freq: Once | OROMUCOSAL | Status: AC
  Administered 2015-10-12: 6 mL via OROMUCOSAL

## 2015-10-12 MED ORDER — ONDANSETRON HCL 4 MG/2ML IJ SOLN
INTRAMUSCULAR | Status: AC
  Filled 2015-10-12: qty 2

## 2015-10-12 NOTE — Op Note (Signed)
[facility] Patient Name: Jennifer [name] Procedure Date: 10/12/2015 1:08 PM MRN: [id number] Date of Birth: [birth year] Attending MD: Gennette Pac , MD CSN: [id number] Age: 45 Admit Type: Inpatient Procedure:                Upper GI endoscopy Indications:              Melena Providers:                Gennette Pac, MD, Brain Hilts, RN, Calton Dach, Technician Referring MD:             [facility], MD (Referring                            MD) Medicines:                Propofol per Anesthesia Complications:            No immediate complications. Estimated Blood Loss:     Estimated blood loss: Minimal. Estimated blood loss                            was minimal. Estimated blood loss was minimal.                            Estimated blood loss was minimal. Procedure:                Pre-Anesthesia Assessment:                           - Prior to the procedure, a History and Physical                            was performed, and patient medications and                            allergies were reviewed. The patient's tolerance of                            previous anesthesia was also reviewed. The risks                            and benefits of the procedure and the sedation                            options and risks were discussed with the patient.                            All questions were answered, and informed consent                            was obtained. [Anticoagulant Agents]. [ASA Grade].                            After  reviewing the risks and benefits, the patient                            was deemed in satisfactory condition to undergo the                            procedure.                           After obtaining informed consent, the endoscope was                            passed under direct vision. Throughout the                            procedure, the patient's blood pressure, pulse, and                             oxygen saturations were monitored continuously. The                            [id number] ([id number] scope was introduced through the                            mouth, and advanced to the gastric cardia. The                            upper GI endoscopy was accomplished with ease. The                            patient tolerated the procedure well. The patient                            tolerated the procedure well. The upper GI                            endoscopy was accomplished without difficulty. The                            patient tolerated the procedure well. Scope In: 1:13:58 PM Scope Out: 1:23:14 PM Total Procedure Duration: 0 hours 9 minutes 16 seconds  Findings:      The examined esophagus was normal. Stomach surgically altered. Residual       gastric mucosa markedly abnormal with vascular congestion and prominent       snake skinning or fish scale appearance. Patient with Billroth II       configuration/gastrojejunostomy. erosions and scar formation on the       gastric mucosa at the anastomosis. Gastric mucosa markedly friable. With       the trivial scope trauma, the anastomosis in this area of breath bled       fairly briskly. I did not see gastric varices, ulcer or infiltrating       process. Scar likely indicative of prior gastric ulcer disease. I       intubated the efferent and afferent limbs and good 15-20  cm. This       segment the GI tract appear normal. Because of the friable friability       and oozing of the anastomosis in the area of the erosions, I was       circumferentially injected 2 mL 100,000 epinephrine in 0.5 mL aliquots.       This provided good hemostasis. Subsequently, (2) very small biopsies for       histology taken. Surprisingly, this maneuver was associated with very       minimal bleeding. Impression:               - Normal esophagus.                           - Surgically altered stomach. 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 Marked friability. Suspect spontaneous                            bleeding coming from stomach intermittently has a                            cause of patient's GI bleed/drop in hemoglobin.                            Status post gastric biopsy. Status post injection                            therapy for hemostasis.                           . Moderate Sedation:      none      Per Anesthesia Care Recommendation:           - Patient has a contact number available for                            emergencies. The signs and symptoms of potential                            delayed complications were discussed with the                            patient. Return to normal activities tomorrow.                            Written discharge instructions were provided to the                            patient                           -  Continue present medications. Avoid                            aspirin/NSAIDs going forward. Would also avoid                            anticoagulation therapy if at all possible.                            Continue twice a day PPI therapy. Add Carafate                            suspension 4 times a day. Ideally, patient should                            have a colonoscopy to complete the GI evaluation.                            However, that is not needed urgently and, in fact,                            she is really not fit for such a procedure at this                            time. Consider repeat EGD for variceal screening in                            12-18 months.                           follow hemoglobin. Overall prognosis guarded.                           - Clear liquid diet today. Procedure Code(s):        --- Professional ---                           43200, Esophagoscopy, flexible,  transoral;                            diagnostic, including collection of specimen(s) by                            brushing or washing, when performed (separate                            procedure) Diagnosis Code(s):        --- Professional ---                           K92.1, Melena (includes Hematochezia) CPT copyright 2016 [employer]. All rights reserved. The codes documented in this report are preliminary and upon coder review may  be revised to meet current compliance requirements. Gerrit Friends. Yomaira Solar, MD Gennette Pac, MD 10/12/2015 1:59:01 PM This report has been signed  electronically. Number of Addenda: 0

## 2015-10-12 NOTE — Progress Notes (Signed)
PROGRESS NOTE  Jennifer [name] ZOX:[id number] DOB: [birth year] DOA: 09/28/2015 PCP: Miguel Aschoff [facility] Summary  42 yof with hx of EtOH abuse presented with LE cellulitis. She was treated with abx and cellulitis has since improved. Pt does have significant (2-3+) edema in BLE. Currently we are trying to diuresis her. Her H/H has been low but not low enough to require transfusion until 10/11/15.   Assessment/Plan: Bilateral lower extremity cellulitis Patient was started on broad-spectrum antibiotics with Zosyn and vancomycin for several days. As her cellulitis regressed, Zosyn and vancomycin were discontinued in favor of doxycycline. The course of doxycycline was completed. -There still appears to be some mild erythema of both legs, but this is likely related to the edema. -Continue to provide supportive treatment.   Early sepsis considered on admission with elevated lactic acid and hypotension.  Elevated lactic acid was likely more related to dehydration, malnutrition and poor oral intake. Blood cultures and urine culture were negative. Antibiotics as above were given and discontinued.  Macrocytic-normocytic anemia. Patient's hemoglobin was 9.3 on admission. It has drifted down gradually and was 6.4 on 10/11/15. Patient has a history of intermittent bright red blood per rectum and melena, but denied it during the hospital course. -Anemia panel ordered. Total iron was 54, ferritin 45, B12 greater than 3000. Patient was transfused 2 units of packed red blood cells with improvement in her hemoglobin to 8.7; slightly drift downward to 8.1. IV heparin discontinued on 10/11/15 in light of the decrease in H/H. SCDs started for DVT prophylaxis. Protonix increased to twice a day. -Gastroenterology was reconsulted. Dr. Jena Gauss performed EGD on 10/12/15. 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 He suspected  spontaneous bleeding coming from the stomach intermittently; status post injection therapy for hemostasis; status post gastric biopsy.  Thrombocytopenia  Patient's platelet count was 123 on admission. 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 Her platelet count has drifted down to 109. -Vitamin B12 and TSH were ordered for further evaluation. Vitamin B12 was greater than 3000. TSH was mildly elevated at 6.5.  Elevated LFTs with hyperbilirubinemia secondary to acute on chronic alcoholic hepatitis in underlying severe diffuse hepatic steatosis  seen on CT 08/2015.  Gastroenterology was consulted. Steroids contraindicated secondary to infection per GI. Probiotic recommended daily. -Patient's LFTs have drifted down, but she still has hyperbilirubinemia. -Continue supportive treatment.  Anasarca. Secondary to poor nutrition, hypoalbuminemia.  Patient presented with global anasarca and 3-4+ bilateral lower extremity pitting edema.  2-D echocardiogram ordered and revealed preserved left ventricular EF and no significant diastolic dysfunction. -She was started on IV Lasix and fluid restriction. Albumin was ordered to aid with diuresis and to attempt to replace blood albumin for oncotic pressure. -Metolazone was recently started. -There have been some decrease in her peripheral edema per patient. SCDs ordered to help with mobilization of fluid out of her legs.   Alcohol dependence/abuse Patient drinks 1 pint of vodka daily. CIWA protocol and vitamin therapy were ordered. She was strongly advised to stop drinking completely. There has been no evidence of alcohol withdrawal.   Mildly elevated TSH. Not clinically hypothyroidism. Recommend follow-up TSH and added free T4 in 3-6 months.  Tobacco use disorder. Counseled on cessation.  Hypokalemia, secondary to diuretic therapy. Potassium chloride started. The dose was increased due to the addition of IV Lasix between packed  red blood cells and the addition of metolazone.    Code Status: Full DVT  prophylaxis: Heparin-discontinued 10/11/15 and started SCDs. Family Communication: No family bedside  Disposition Plan: Discharge home once improved  Consultants:  Gastroenterology, signed off; reconsulted for anemia 10/12/15.  Procedures: 1.) EGD by Dr. Jena Gaussourk 10/12/15: Normal esophagus. Surgically altered, with Billroth II configuration. 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 Marcated friability. Suspect spontaneous bleeding coming from the stomach intermittently. Status post gastric biopsy. Status post injection therapy for hemostasis.  2.) 2-D echo-Study Conclusions - Left ventricle: The cavity size was normal. Wall thickness was  normal. Systolic function was vigorous. The estimated ejection  fraction was in the range of 65% to 70%. Left ventricular  diastolic function parameters were normal. - Aortic valve: Valve area (VTI): 2.55 cm^2. Valve area (Vmax):  2.44 cm^2. - Technically difficult study.  Antibiotics:  Zosyn 3/16>>3/20  Vanc 3/16 >>3/20  Doxycycline 3/20>>3/26  HPI/Subjective: Patient has no complaints of abdominal pain, nausea, or vomiting. She reports a light brown stool last night.  Objective: Filed Vitals:   10/12/15 1300 10/12/15 1345 10/12/15 1400 10/12/15 1556  BP: 93/56 97/54 94/48  104/60  Pulse:  95 86 100  Temp:  98.5 F (36.9 C)  98.7 F (37.1 C)  TempSrc:    Oral  Resp: 12 23 9 16   Height:      Weight:      SpO2: 100% 99% 98% 97%    Intake/Output Summary (Last 24 hours) at 10/12/15 1813 Last data filed at 10/12/15 1700  Gross per 24 hour  Intake    750 ml  Output   1300 ml  Net   -550 ml     Filed Weights   10/11/15 0609 10/12/15 0500 10/12/15 1137  Weight: 78.835 kg (173 lb 12.8 oz) 78.472 kg (173 lb) 78.472 kg (173 lb)    Exam  General:45 year old Caucasian woman who appears to be older than her  stated age.   Cardiovascular: S1, S2, no murmurs rubs or gallops.   Respiratory: Clear anteriorly with decreased breath sounds in the bases.  Abdomen: Positive bowel sounds, soft, nontender, nondistended.  Musculoskeletal: 2-3+ bilateral lower extremity edema, pitting.  Neurologic: She is alert and oriented 3. Cranial nerves II through XII are grossly intact.   Scheduled Meds: . sodium chloride   Intravenous Once  . acidophilus  2 capsule Oral Q breakfast  . folic acid  1 mg Oral Daily  . furosemide  40 mg Intravenous BID  . metolazone  5 mg Oral Daily  . midodrine  5 mg Oral TID WC  . multivitamin with minerals  1 tablet Oral Daily  . nystatin   Topical TID  . ondansetron  4 mg Oral TID AC & HS  . pantoprazole  40 mg Oral BID AC  . potassium chloride  40 mEq Oral TID  . sodium chloride flush  10-40 mL Intracatheter Q12H  . sodium chloride flush  3 mL Intravenous Q12H  . sucralfate  1 g Oral TID WC & HS  . thiamine  100 mg Oral Daily   Or  . thiamine  100 mg Intravenous Daily   Continuous Infusions:    Principal Problem:   Lower extremity cellulitis Active Problems:   Acute blood loss anemia   Mucosal abnormality of stomach   Gastric erosion   Alcoholic hepatitis without ascites   Alcohol dependence (HCC)   Depression   Gastric bypass status for obesity   Elevated LFTs   Tobacco use disorder   Alcoholic hepatitis   Hypotension  Thrombocytopenia (HCC)   Time spent 35 minutes    Elliot Cousin, M.D.  [facility]  Pager : [phone]  If 7PM-7AM, please contact night-coverage at [email or web address], password [facility] 10/08/2015, 7:23 AM  LOS: 10 days

## 2015-10-12 NOTE — Progress Notes (Signed)
Subjective:  We were reconsulted for drop in Hgb. Patient has been in the hospital since 09/28/2015 when she presented with lower extremity cellulitis and decompensated liver disease in the setting of alcoholic hepatitis. Also with history of gastric bypass for obesity, 3 or 4 episodes of pancreatitis in the past likely related alcohol.  Objective: Vital signs in last 24 hours: Temp:  [98.1 F (36.7 C)-98.6 F (37 C)] 98.6 F (37 C) (03/30 0500) Pulse Rate:  [75-100] 97 (03/30 0500) Resp:  [18] 18 (03/30 0500) BP: (92-110)/(47-65) 104/47 mmHg (03/30 0500) SpO2:  [97 %-100 %] 97 % (03/30 0500) Weight:  [173 lb (78.472 kg)] 173 lb (78.472 kg) (03/30 0500) Last BM Date: 10/11/15 General:   Alert,  Well-developed, well-nourished, pleasant and cooperative in NAD Head:  Normocephalic and atraumatic. Eyes:  Sclera clear, no icterus.  Chest: CTA bilaterally without rales, rhonchi, crackles.    Heart:  Regular rate and rhythm; no murmurs, clicks, rubs,  or gallops. Abdomen:  Soft, nontender and nondistended. No masses, hepatosplenomegaly or hernias noted. Normal bowel sounds, without guarding, and without rebound.   Extremities:  Without clubbing, deformity or edema. Neurologic:  Alert and  oriented x4;  grossly normal neurologically. Skin:  Intact without significant lesions or rashes. Psych:  Alert and cooperative. Normal mood and affect.  Intake/Output from previous day: 03/29 0701 - 03/30 0700 In: 1406 [P.O.:720; I.V.:6; Blood:680] Out: 2550 [Urine:2550] Intake/Output this shift:    Lab Results: CBC  Recent Labs  10/11/15 0724 10/11/15 2131  WBC 5.9  --   HGB 6.4* 8.7*  HCT 18.1* 25.3*  MCV 97.8  --   PLT 109*  --    BMET  Recent Labs  10/10/15 0543 10/11/15 0724 10/12/15 0606  NA 138 139 140  K 3.0* 3.1* 3.1*  CL 99* 99* 99*  CO2 GLUCOSE 94 94 75  BUN <5* <5* 5*  CREATININE 1.14* 1.12* 1.09*  CALCIUM 7.9* 7.9* 7.9*   LFTs  Recent Labs   10/11/15 0724  BILITOT 8.3*  ALKPHOS 101  AST 73*  ALT 26  PROT 5.1*  ALBUMIN 3.0*   No results for input(s): LIPASE in the last 72 hours. PT/INR No results for input(s): LABPROT, INR in the last 72 hours.    Imaging Studies: Dg Chest 2 View  09/28/2015  CLINICAL DATA:  Bilateral leg swelling for a while. History of pancreatitis and smoking. EXAM: CHEST  2 VIEW COMPARISON:  Radiographs 09/01/2015 and 09/18/2015. FINDINGS: Stable right hemidiaphragm elevation. The heart size and mediastinal contours are stable with a recurrent moderate-sized hiatal hernia. Linear right perihilar atelectasis noted on the most recent examination has improved. No confluent airspace opacity, pleural effusion or pneumothorax. IMPRESSION: No acute cardiopulmonary process. Interval improved right perihilar aeration. Electronically Signed   By: Carey Bullocks M.D.   On: 09/28/2015 15:51   Dg Chest 2 View  09/18/2015  CLINICAL DATA:  Productive cough with congestion and shortness of breath. Smoker. EXAM: CHEST  2 VIEW COMPARISON:  Portable chest 09/01/2015.  Abdominal CT 09/01/2015. FINDINGS: The heart size and mediastinal contours are stable. There is a moderate size hiatal hernia. Surgical clips are present within the upper abdomen. There is mild linear scarring or atelectasis in the right perihilar region. No edema, confluent airspace opacity or significant pleural effusion. The bones appear unremarkable. IMPRESSION: No acute cardiopulmonary process. Postsurgical changes from previous gastric bypass with stable moderate size hiatal hernia. Electronically Signed   By: Carey Bullocks  M.D.   On: 09/18/2015 13:47  [2 weeks]   Assessment: 45 year old female admitted with cellulitis and decompensated liver disease in setting of ETOH hepatitis/sepsis. Discriminant function 30 on admission. Most recent imaging Feb 2017 with severe hepatic steatosis. Not a candidate for steroids due to sepsis and DF less than 32 on  admission. Patient with prolonged admission for cellulitis and edema. Plan on discharge when significantly diuresed. We were consult again today for drop in hemoglobin requiring blood transfusion. Patient states she had black stool about 3 days ago. Hemoglobin was 9.3 on admission. Has drifted down to 6.4 yesterday. She received 2 units of packed red blood cells. IV heparin was discontinued and she was started on SCDs. Her protonic was increased to twice a day. No prior EGD/TCS.  Etoh hepatitis: Total bilirubin continues to climb, AP improved as well as albumin. Not a candidate for steroids due to infection. Will recheck PT/INR and LFTs today. Platelet count is mildly low but stable.    Plan: 1. EGD in OR today. Discussed with Dr. Jena Gaussourk. Patient is NPO. Heparin stopped yesterday.  2. Agree with PPI BID.  3. LFTs, PT/INR.   Leanna BattlesLeslie S. Dixon BoosLewis, PA-C [facility] [phone] 3/30/20179:18 AM     LOS: 14 days

## 2015-10-12 NOTE — Anesthesia Preprocedure Evaluation (Signed)
Anesthesia Evaluation  Patient identified by MRN, date of birth, ID band Patient awake    Reviewed: Allergy & Precautions, NPO status , Patient's Chart, lab work & pertinent test results  Airway Mallampati: I  TM Distance: >3 FB     Dental  (+) Poor Dentition, Missing, Chipped   Pulmonary Current Smoker,    breath sounds clear to auscultation       Cardiovascular negative cardio ROS   Rhythm:Regular Rate:Normal     Neuro/Psych PSYCHIATRIC DISORDERS (slow mentation) Depression    GI/Hepatic (+)     substance abuse  alcohol use, Hepatitis -ETOH panreatitis, hepatitis GI bleed Gastric bypass    Endo/Other    Renal/GU      Musculoskeletal   Abdominal   Peds  Hematology  (+) anemia ,   Anesthesia Other Findings   Reproductive/Obstetrics                             Anesthesia Physical Anesthesia Plan  ASA: III  Anesthesia Plan: MAC   Post-op Pain Management:    Induction: Intravenous  Airway Management Planned: Simple Face Mask  Additional Equipment:   Intra-op Plan:   Post-operative Plan:   Informed Consent: I have reviewed the patients History and Physical, chart, labs and discussed the procedure including the risks, benefits and alternatives for the proposed anesthesia with the patient or authorized representative who has indicated his/her understanding and acceptance.     Plan Discussed with:   Anesthesia Plan Comments:         Anesthesia Quick Evaluation

## 2015-10-12 NOTE — Anesthesia Postprocedure Evaluation (Signed)
Anesthesia Post Note  Patient: Jennifer [name]  Procedure(s) Performed: Procedure(s) (LRB): ESOPHAGOGASTRODUODENOSCOPY (EGD) WITH PROPOFOL (N/A) ESOPHAGEAL BANDING (N/A)  Patient location during evaluation: PACU Anesthesia Type: MAC Level of consciousness: awake, awake and alert, oriented and patient cooperative Pain management: satisfactory to patient Vital Signs Assessment: post-procedure vital signs reviewed and stable Respiratory status: spontaneous breathing, respiratory function stable and patient connected to nasal cannula oxygen Cardiovascular status: blood pressure returned to baseline and stable Anesthetic complications: no    Last Vitals:  Filed Vitals:   10/12/15 1345 10/12/15 1400  BP: 97/54 94/48  Pulse: 95 86  Temp: 36.9 C   Resp: 23 9    Last Pain:  Filed Vitals:   10/12/15 1404  PainSc: 5                  Chiron Campione

## 2015-10-12 NOTE — Transfer of Care (Signed)
Immediate Anesthesia Transfer of Care Note  Patient: Jennifer [name]  Procedure(s) Performed: Procedure(s): ESOPHAGOGASTRODUODENOSCOPY (EGD) WITH PROPOFOL (N/A) ESOPHAGEAL BANDING (N/A)  Patient Location: PACU  Anesthesia Type:MAC  Level of Consciousness: awake, alert , oriented and patient cooperative  Airway & Oxygen Therapy: Patient Spontanous Breathing and Patient connected to nasal cannula oxygen  Post-op Assessment: Report given to RN, Post -op Vital signs reviewed and stable and Patient moving all extremities X 4  Post vital signs: Reviewed and stable  Last Vitals:  Filed Vitals:   10/12/15 1257 10/12/15 1258  BP:    Pulse:    Temp:    Resp: 10 13    Complications: No apparent anesthesia complications

## 2015-10-12 NOTE — OR Nursing (Signed)
Skin assessment done on arrival to preop , patient has sores , skin scabs and tears to arms , legs. Buttocks.  Lower legs swollen and red with multiple  sores, scratches  to lower legs,  Buttock noted to have large area of skin break down. Scabs that looked moistened and looked like the skin was coming off.  All over jaundice and complaints of itching.

## 2015-10-12 NOTE — OR Nursing (Signed)
Rechecked perineum while patiet was lying down, entire perineum back to front red with skin break down.

## 2015-10-13 DIAGNOSIS — F1029 Alcohol dependence with unspecified alcohol-induced disorder: Secondary | ICD-10-CM

## 2015-10-13 DIAGNOSIS — E440 Moderate protein-calorie malnutrition: Secondary | ICD-10-CM | POA: Diagnosis present

## 2015-10-13 DIAGNOSIS — D62 Acute posthemorrhagic anemia: Secondary | ICD-10-CM

## 2015-10-13 DIAGNOSIS — I959 Hypotension, unspecified: Secondary | ICD-10-CM

## 2015-10-13 DIAGNOSIS — K253 Acute gastric ulcer without hemorrhage or perforation: Secondary | ICD-10-CM

## 2015-10-13 DIAGNOSIS — K852 Alcohol induced acute pancreatitis without necrosis or infection: Secondary | ICD-10-CM

## 2015-10-13 DIAGNOSIS — D696 Thrombocytopenia, unspecified: Secondary | ICD-10-CM

## 2015-10-13 LAB — CBC
HEMATOCRIT: 22.9 % — AB (ref 36.0–46.0)
HEMOGLOBIN: 7.8 g/dL — AB (ref 12.0–15.0)
MCH: 31.7 pg (ref 26.0–34.0)
MCHC: 34.1 g/dL (ref 30.0–36.0)
MCV: 93.1 fL (ref 78.0–100.0)
Platelets: 122 10*3/uL — ABNORMAL LOW (ref 150–400)
RBC: 2.46 MIL/uL — AB (ref 3.87–5.11)
RDW: 22.2 % — ABNORMAL HIGH (ref 11.5–15.5)
WBC: 6 10*3/uL (ref 4.0–10.5)

## 2015-10-13 LAB — BASIC METABOLIC PANEL
ANION GAP: 10 (ref 5–15)
BUN: 5 mg/dL — ABNORMAL LOW (ref 6–20)
CHLORIDE: 99 mmol/L — AB (ref 101–111)
CO2: 30 mmol/L (ref 22–32)
CREATININE: 1.24 mg/dL — AB (ref 0.44–1.00)
Calcium: 8 mg/dL — ABNORMAL LOW (ref 8.9–10.3)
GFR calc non Af Amer: 52 mL/min — ABNORMAL LOW (ref >60)
Glucose, Bld: 70 mg/dL (ref 65–99)
Potassium: 3.4 mmol/L — ABNORMAL LOW (ref 3.5–5.1)
SODIUM: 139 mmol/L (ref 135–145)

## 2015-10-13 LAB — HEPATIC FUNCTION PANEL
ALBUMIN: 2.7 g/dL — AB (ref 3.5–5.0)
ALK PHOS: 80 U/L (ref 38–126)
ALT: 24 U/L (ref 14–54)
AST: 66 U/L — AB (ref 15–41)
BILIRUBIN INDIRECT: 5.8 mg/dL — AB (ref 0.3–0.9)
Bilirubin, Direct: 6.2 mg/dL — ABNORMAL HIGH (ref 0.1–0.5)
TOTAL PROTEIN: 4.8 g/dL — AB (ref 6.5–8.1)
Total Bilirubin: 12 mg/dL — ABNORMAL HIGH (ref 0.3–1.2)

## 2015-10-13 LAB — PREPARE RBC (CROSSMATCH)

## 2015-10-13 MED ORDER — POTASSIUM CHLORIDE CRYS ER 20 MEQ PO TBCR
30.0000 meq | EXTENDED_RELEASE_TABLET | Freq: Three times a day (TID) | ORAL | Status: DC
  Administered 2015-10-13 – 2015-10-14 (×2): 30 meq via ORAL
  Filled 2015-10-13 (×3): qty 1

## 2015-10-13 MED ORDER — BOOST / RESOURCE BREEZE PO LIQD
1.0000 | Freq: Three times a day (TID) | ORAL | Status: DC
  Administered 2015-10-13 – 2015-10-17 (×10): 1 via ORAL

## 2015-10-13 MED ORDER — FUROSEMIDE 10 MG/ML IJ SOLN
20.0000 mg | Freq: Once | INTRAMUSCULAR | Status: AC
  Administered 2015-10-13: 20 mg via INTRAVENOUS
  Filled 2015-10-13: qty 2

## 2015-10-13 MED ORDER — SODIUM CHLORIDE 0.9 % IV SOLN
Freq: Once | INTRAVENOUS | Status: AC
  Administered 2015-10-13: 11:00:00 via INTRAVENOUS

## 2015-10-13 NOTE — Addendum Note (Signed)
Addendum  created 10/13/15 1444 by Earleen NewportAmy A Adams, CRNA   Modules edited: Clinical Notes   Clinical Notes:  File: [id number]

## 2015-10-13 NOTE — Progress Notes (Signed)
Subjective: Today she states she's doing ok. Feels swelling is improved. No nausea so hasn't asked for nausea medicine. Denies abdominal pain. Last BM this morning which was soft and normal color, no hematochezia or melena. Tolerating diet. No further GI complaints.  Objective: Vital signs in last 24 hours: Temp:  [98 F (36.7 C)-98.7 F (37.1 C)] 98 F (36.7 C) (03/31 0706) Pulse Rate:  [77-100] 95 (03/31 0800) Resp:  [9-25] 16 (03/31 0800) BP: (84-104)/(31-60) 100/54 mmHg (03/31 0800) SpO2:  [97 %-100 %] 97 % (03/31 0706) Weight:  [173 lb (78.472 kg)] 173 lb (78.472 kg) (03/30 1137) Last BM Date: 10/12/15 General:   Alert and oriented, pleasant. Was sleeping when I entered the room, awoke easily to voice. Head:  Normocephalic and atraumatic. Heart:  S1, S2 present, no murmurs noted.  Lungs: Clear to auscultation bilaterally, without wheezing, rales, or rhonchi.  Abdomen:  Bowel sounds present, soft, non-tender, non-distended. No rebound or guarding. Possible mild jaundice. Msk:  Symmetrical without gross deformities. Pulses:  Normal pulses noted. Neurologic:  Grossly normal neurologically. Skin:  Warm and dry, intact without significant lesions.  Psych:  Alert and cooperative. Normal mood and affect.  Intake/Output from previous day: 03/30 0701 - 03/31 0700 In: 180 [P.O.:180] Out: 1350 [Urine:1350] Intake/Output this shift: Total I/O In: -  Out: 300 [Urine:300]  Lab Results:  Recent Labs  10/11/15 0724 10/11/15 2131 10/12/15 0606 10/13/15 0637  WBC 5.9  --  7.0 6.0  HGB 6.4* 8.7* 8.1* 7.8*  HCT 18.1* 25.3* 23.4* 22.9*  PLT 109*  --  112* 122*   BMET  Recent Labs  10/11/15 0724 10/12/15 0606 10/13/15 0637  NA 139 140 139  K 3.1* 3.1* 3.4*  CL 99* 99* 99*  CO2 GLUCOSE 94 75 70  BUN <5* 5* 5*  CREATININE 1.12* 1.09* 1.24*  CALCIUM 7.9* 7.9* 8.0*   LFT  Recent Labs  10/11/15 0724 10/12/15 0951  PROT 5.1* 5.4*  ALBUMIN 3.0* 3.1*   AST 73* 69*  ALT 26 29  ALKPHOS 101 100  BILITOT 8.3* 12.6*  BILIDIR  --  6.7*  IBILI  --  5.9*   PT/INR  Recent Labs  10/12/15 0951  LABPROT 22.1*  INR 1.95*   Hepatitis Panel No results for input(s): HEPBSAG, HCVAB, HEPAIGM, HEPBIGM in the last 72 hours.   Studies/Results: No results found.  Assessment: 45 year old female admitted with cellulitis and decompensated liver disease in setting of ETOH hepatitis/sepsis. Discriminant function 30 on admission. Most recent imaging Feb 2017 with severe hepatic steatosis. Not a candidate for steroids due to sepsis and DF less than 32 on admission. Patient with prolonged admission for cellulitis and edema. Plan on discharge when significantly diuresed. We were consulted again yesterday for drop in hemoglobin requiring blood transfusion. Patient states she had black stool about 3 days ago. Hemoglobin was 9.3 on admission. Has drifted down to 6.4 yesterday. She received 2 units of packed red blood cells. IV heparin was discontinued and she was started on SCDs. Her protonix was increased to twice a day. No prior EGD/TCS.  Etoh hepatitis: Total bilirubin continues to climb, AP improved as well as albumin. Not a candidate for steroids due to infection. Platelet count is mildly low but stable.   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 GI bleed  likely spontaneous stomach bleeding in the setting of gastropathy/friability. 2 mL 100,000 epi injected for hemostasis. Recommended avoid all NSAIDs, continue bid PPI, avoid anticoagulants if at all possible, Carafate qid. Consider outpatient colonoscopy if/when patient is stable for it, consider EGD 12-18 months for variceal screening.  Today her hgb has drifted mildly from 8.1 to 7.8. Platelets 122. BMP showed  improved hypokalemia at 3.4, Cr elevated 1.24, calcium stable. BP remains soft but improved overall. Other vitals stable. Overal seems clinically stable today, possible mild jaundice in setting of climbing bili.  Calculations from yesterday's labs: MELD 24 (19.6% 3030-month mortality) Child-Pugh B    Plan: 1. Continue supportive measures 2. Transfuse as necessary 3. Continue PPI 4. Monitor for any further GI bleed 5. Follow H/H 6. Check HFP today for bili    Wynne DustEric Gill, AGNP-C Adult & Gerontological Nurse Practitioner [facility]    LOS: 15 days    10/13/2015, 8:40 AM

## 2015-10-13 NOTE — Addendum Note (Signed)
Addendum  created 10/13/15 1037 by Earleen NewportAmy A Adams, CRNA   Modules edited: Anesthesia Events

## 2015-10-13 NOTE — Progress Notes (Signed)
Initial Nutrition Assessment  DOCUMENTATION CODES:  Non-severe (moderate) malnutrition in context of chronic illness   Pt meets criteria for MODERATE MALNUTRITION in the context of Chronic Illness as evidenced by Moderate muscle loss and mild fat loss.  INTERVENTION:  Already on Thiamin, Folate, MVI with minerals.   Other Gastric Bypass recommendations are  2000 iu Vit D3, Iron 18 mg (in additional to iron in mvi with min) and calcium citrate 1000-1500 mg/day\  Boost Breeze po TID, each supplement provides 250 kcal and 9 grams of protein  NUTRITION DIAGNOSIS:  Increased nutrient needs related to altered GI function, chronic illness, wound healing as evidenced by estimated by the combined nutritional requirements for these conditions  GOAL:  Patient will meet greater than or equal to 90% of their needs  MONITOR:  PO intake, Supplement acceptance, Diet advancement, Labs, Skin  REASON FOR ASSESSMENT:  LOS    ASSESSMENT:  45 y/o female Pmhx etoh abuse, tobacco abuse, pancreatitis, depression, gastric bypass who initially presented after she developed a rash on her legs. Admitted for early sepsis.   Pt being seen due to LOS. Admission hx shows suspected cirrhosis secondary to her alcohol abuse (drinks 1 pint vodka each day). Main reason for prolonged admission is difficulty diuresing. She also had drop in Hemoglobin. EGD yesterday revealed abnormal gastric mucosa, gastropathy, erosions.  Pt reports that her Gastric Bypass (vs billroth 2?) was in April 2007. Her weight at that time was 314 lbs. Her weight had stabilized ~180 lbs fort some time and states this is where she is comfortable. She states that she had been taking supplements for some time. She ran out quite a few months ago and simply stopped taking them. She reports at one point she had labs on b1 and b12. One was very low and one was very high. She is uncertain which was low and which was high. RD emphasized the importance of  taking multivitamins given her history of Bypass including  2x multivitamins, 1000 Vit B12, 2000 iu Vit D3, Iron 18 mg (in additional to iron in mvi with min) and calcium citrate 1000-1500 mg/day.  She states she eats well. She eats small amounts throughout the day and denies any N/V. Her documented meal intake while admitted has been 25-75% for the last week.   NFPE: Did not attempt, pt was slightly agitated. Visually, jaundiced, mild orbital/temporal wasting.   Labs reviewed: Hypokalemic, hypochloremic, Anemic,Hypoalbuminemia, Elevated Bili/Liver enzymes, low total pro   Diet Order:  Diet clear liquid Room service appropriate?: Yes; Fluid consistency:: Thin  Skin: Generalized abrasions, Rash/Cellulitis to buttocks, perineum  Last BM:  3/30  Height:  Ht Readings from Last 1 Encounters:  10/12/15  (1.727 m)   Weight:  Wt Readings from Last 1 Encounters:  10/12/15 173 lb (78.472 kg)   Wt Readings from Last 10 Encounters:  10/12/15 173 lb (78.472 kg)  09/18/15 172 lb (78.019 kg)  09/01/15 180 lb (81.647 kg)  10/15/14 169 lb 15.6 oz (77.1 kg)  01/23/14 157 lb 4.8 oz (71.351 kg)  05/05/13 171 lb (77.565 kg)  05/02/13 170 lb (77.111 kg)  04/20/13 180 lb (81.647 kg)  03/10/13 162 lb (73.483 kg)   Ideal Body Weight:  63.64 kg  BMI:  Body mass index is 26.31 kg/(m^2).  Estimated Nutritional Needs:  Kcal:  1800-2050 (23-26 kcal/kg bw) Protein:  83-95 g Pro (1.3-1.5 g/kg ibw) Fluid:  2 liters  EDUCATION NEEDS:  No education needs identified at this time  Christophe LouisNathan Sedra Morfin RD, LDN Clinical Nutrition Pager: [phone] 10/13/2015 11:33 AM

## 2015-10-13 NOTE — Anesthesia Postprocedure Evaluation (Signed)
Anesthesia Post Note  Patient: Jennifer [name]  Procedure(s) Performed: Procedure(s) (LRB): ESOPHAGOGASTRODUODENOSCOPY (EGD) WITH PROPOFOL (N/A) ESOPHAGEAL BANDING (N/A)  Patient location during evaluation: Nursing Unit Anesthesia Type: MAC Level of consciousness: awake and alert and oriented Pain management: pain level controlled Vital Signs Assessment: post-procedure vital signs reviewed and stable Respiratory status: spontaneous breathing Cardiovascular status: stable Postop Assessment: no signs of nausea or vomiting Anesthetic complications: no    Last Vitals:  Filed Vitals:   10/13/15 1312 10/13/15 1338  BP: 94/52 92/51  Pulse: 94 87  Temp: 37 C 37.1 C  Resp: 16 16    Last Pain:  Filed Vitals:   10/13/15 1342  PainSc: 7                  Melody Savidge A

## 2015-10-13 NOTE — Progress Notes (Signed)
PROGRESS NOTE  Jennifer [name] L Goings ZOX:[id number] DOB: 1970-12-14 DOA: 09/28/2015 [facility]  45 yof with hx of EtOH abuse presented with LE cellulitis. She was treated with abx and cellulitis has since improved. Pt does have significant (2-3+) edema in BLE. Currently we are trying to diuresis her. Her H/H has been low but not low enough to require transfusion until 10/11/15.   Assessment/Plan: Bilateral lower extremity cellulitis and edema Patient was started on broad-spectrum antibiotics with Zosyn and vancomycin for several days. As her cellulitis regressed, Zosyn and vancomycin were discontinued in favor of doxycycline. The course of doxycycline was completed. -There still appears to be some mild erythema of both legs, but this is likely related to the edema. -Continue SCDs for compression purposes and supportive treatment.   Early sepsis considered on admission with elevated lactic acid and hypotension.  Elevated lactic acid was likely more related to dehydration, malnutrition and poor oral intake. Blood cultures and urine culture were negative. Antibiotics as above were given and discontinued.  Chronically low blood pressure/hypotension. Due to malnutrition and hepatopathy. Continue Midrin. -Hold diuretics for a day or 2. Transfusing pbcss which will help.  Macrocytic-normocytic-acute blood loss anemia; secondary to gastric hemorrhage. Patient's hemoglobin was 9.3 on admission. It has drifted down gradually and was 6.4 on 10/11/15. Patient has a history of intermittent bright red blood per rectum and melena, but denied it during the hospital course. -Anemia panel ordered. Total iron was 54, ferritin 45, B12 greater than 3000. - IV heparin discontinued on 10/11/15 in light of the decrease in H/H. SCDs started for DVT prophylaxis. Protonix increased to twice a day. -Patient transfused 2 units of packed red blood cells with improvement in her hemoglobin to 8.7. It has  drifted down to 7.8. In light of her relative hypotension and decrease in H/H, will transfuse another 2 units today-10/13/15 -Gastroenterology was reconsulted. Dr. Jena Gaussourk performed EGD on 10/12/15. 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 He suspected spontaneous bleeding coming from the stomach intermittently; status post injection therapy for hemostasis; status post gastric biopsy. -PPI increase to twice a day. Carafate added by GI. -Patient wants her diet advanced, but this will be deferred to GI.  Thrombocytopenia  Patient's platelet count was 123 on admission. 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 Her platelet count has drifted down to 109. -Vitamin B12 and TSH were ordered for further evaluation. Vitamin B12 was greater than 3000. TSH was mildly elevated at 6.5. -Subcutaneous heparin was discontinued. Her platelet count has improved.  Elevated LFTs with hyperbilirubinemia secondary to acute on chronic alcoholic hepatitis in underlying severe diffuse hepatic steatosis  seen on CT 08/2015.  Gastroenterology was consulted. Steroids contraindicated secondary to infection per GI. Probiotic recommended daily. -Patient's LFTs have drifted down, but she still has significant hyperbilirubinemia. -Continue supportive treatment; will hold diuretics.  Anasarca. Secondary to poor nutrition, hypoalbuminemia.  Patient presented with global anasarca and 3-4+ bilateral lower extremity pitting edema.  2-D echocardiogram ordered and revealed preserved left ventricular EF and no significant diastolic dysfunction. -She was started on IV Lasix and fluid restriction. Albumin was ordered to aid with diuresis and to attempt to replace blood albumin for oncotic pressure. -Metolazone was recently added. -There have been some decrease in her peripheral edema per patient. SCDs ordered to  help with mobilization of fluid out of her legs has helped. She has -8.8 L. -Due to her  soft blood pressures and increase in hyperbilirubinemia, will hold Lasix and metolazone except for giving it between units of packed red blood cells.   Alcohol dependence/abuse Patient drinks 1 pint of vodka daily. CIWA protocol and vitamin therapy were ordered. She was strongly advised to stop drinking completely. There has been no evidence of alcohol withdrawal.   Mildly elevated TSH. Not clinically hypothyroidism. Recommend follow-up TSH and added free T4 in 3-6 months.  Tobacco use disorder. Counseled on cessation.  Hypokalemia, secondary to diuretic therapy. Potassium chloride started. The dose was increased due to the addition of IV Lasix between packed red blood cells and the addition of metolazone. Serum potassium improving. Consider adding spironolactone.   Code Status: Full DVT prophylaxis: Heparin-discontinued 10/11/15 and started SCDs. Family Communication: No family bedside  Disposition Plan: Discharge home once improved  Consultants:  Gastroenterology, signed off; reconsulted for anemia 10/12/15.  Procedures: 1.) EGD by Dr. Jena Gauss 10/12/15: Normal esophagus. Surgically altered, with Billroth II configuration. 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 Marcated friability. Suspect spontaneous bleeding coming from the stomach intermittently. Status post gastric biopsy. Status post injection therapy for hemostasis.  2.) 2-D echo-Study Conclusions - Left ventricle: The cavity size was normal. Wall thickness was  normal. Systolic function was vigorous. The estimated ejection  fraction was in the range of 65% to 70%. Left ventricular  diastolic function parameters were normal. - Aortic valve: Valve area (VTI): 2.55 cm^2. Valve area (Vmax):  2.44 cm^2. - Technically difficult study.  Antibiotics:  Zosyn 3/16>>3/20  Vanc 3/16  >>3/20  Doxycycline 3/20>>3/26  HPI/Subjective: Patient complains of hunger and wants to solid foods. I explained that the decision to restart solids will be deferred to gastroenterology.  Objective: Filed Vitals:   10/13/15 0800 10/13/15 1300 10/13/15 1312 10/13/15 1338  BP: 100/54 94/52 94/52  92/51  Pulse: 95 94 94 87  Temp:  98.6 F (37 C) 98.6 F (37 C) 98.7 F (37.1 C)  TempSrc:  Oral Oral Oral  Resp: Height:      Weight:      SpO2:  98% 98% 99%    Intake/Output Summary (Last 24 hours) at 10/13/15 1424 Last data filed at 10/13/15 1338  Gross per 24 hour  Intake    635 ml  Output   1400 ml  Net   -765 ml     Filed Weights   10/11/15 0609 10/12/15 0500 10/12/15 1137  Weight: 78.835 kg (173 lb 12.8 oz) 78.472 kg (173 lb) 78.472 kg (173 lb)    Exam  General:45 year old Caucasian woman who appears to be older than her stated age.   Skin diffusely jaundiced.  Cardiovascular: S1, S2, no murmurs rubs or gallops.   Respiratory: Clear anteriorly with decreased breath sounds in the bases.  Abdomen: Positive bowel sounds, soft, nontender, nondistended.  Musculoskeletal: 2+ bilateral lower extremity edema, pitting; progressive decrease in lower extremity edema.  Neurologic: She is alert and oriented 3. Cranial nerves II through XII are grossly intact.   Scheduled Meds: . sodium chloride   Intravenous Once  . acidophilus  2 capsule Oral Q breakfast  . feeding supplement  1 Container Oral TID BM  . folic acid  1 mg Oral Daily  . furosemide  20 mg Intravenous Once  . metolazone  5 mg Oral Daily  . midodrine  5 mg Oral TID WC  . multivitamin with minerals  1 tablet Oral Daily  . nystatin   Topical  TID  . ondansetron  4 mg Oral TID AC & HS  . pantoprazole  40 mg Oral BID AC  . potassium chloride  40 mEq Oral TID  . sodium chloride flush  10-40 mL Intracatheter Q12H  . sodium chloride flush  3 mL Intravenous Q12H  . sucralfate  1 g Oral TID WC & HS   . thiamine  100 mg Oral Daily   Or  . thiamine  100 mg Intravenous Daily   Continuous Infusions:    Principal Problem:   Lower extremity cellulitis Active Problems:   Acute blood loss anemia   Mucosal abnormality of stomach   Gastric erosion   Alcoholic hepatitis without ascites   Alcohol dependence (HCC)   Depression   Gastric bypass status for obesity   Elevated LFTs   Tobacco use disorder   Alcoholic hepatitis   Hypotension   Thrombocytopenia (HCC)   Malnutrition of moderate degree   Time spent 35 minutes    Elliot Cousin, M.D.  [facility]  Pager : [phone]  If 7PM-7AM, please contact night-coverage at [email or web address], password [facility] 10/08/2015, 7:23 AM  LOS: 10 days

## 2015-10-14 DIAGNOSIS — L03119 Cellulitis of unspecified part of limb: Secondary | ICD-10-CM

## 2015-10-14 DIAGNOSIS — K7010 Alcoholic hepatitis without ascites: Secondary | ICD-10-CM

## 2015-10-14 DIAGNOSIS — N179 Acute kidney failure, unspecified: Secondary | ICD-10-CM | POA: Diagnosis not present

## 2015-10-14 DIAGNOSIS — E440 Moderate protein-calorie malnutrition: Secondary | ICD-10-CM

## 2015-10-14 DIAGNOSIS — E872 Acidosis: Secondary | ICD-10-CM

## 2015-10-14 DIAGNOSIS — K7689 Other specified diseases of liver: Secondary | ICD-10-CM

## 2015-10-14 LAB — TYPE AND SCREEN
ABO/RH(D): A POS
Antibody Screen: NEGATIVE
UNIT DIVISION: 0
Unit division: 0
Unit division: 0
Unit division: 0

## 2015-10-14 LAB — CBC
HEMATOCRIT: 32.1 % — AB (ref 36.0–46.0)
HEMOGLOBIN: 11 g/dL — AB (ref 12.0–15.0)
MCH: 31.3 pg (ref 26.0–34.0)
MCHC: 34.3 g/dL (ref 30.0–36.0)
MCV: 91.2 fL (ref 78.0–100.0)
Platelets: 133 10*3/uL — ABNORMAL LOW (ref 150–400)
RBC: 3.52 MIL/uL — AB (ref 3.87–5.11)
RDW: 20.4 % — ABNORMAL HIGH (ref 11.5–15.5)
WBC: 6.3 10*3/uL (ref 4.0–10.5)

## 2015-10-14 LAB — BASIC METABOLIC PANEL
Anion gap: 12 (ref 5–15)
BUN: 5 mg/dL — AB (ref 6–20)
CHLORIDE: 100 mmol/L — AB (ref 101–111)
CO2: 28 mmol/L (ref 22–32)
Calcium: 8.4 mg/dL — ABNORMAL LOW (ref 8.9–10.3)
Creatinine, Ser: 1.34 mg/dL — ABNORMAL HIGH (ref 0.44–1.00)
GFR calc non Af Amer: 48 mL/min — ABNORMAL LOW (ref >60)
GFR, EST AFRICAN AMERICAN: 55 mL/min — AB (ref >60)
Glucose, Bld: 64 mg/dL — ABNORMAL LOW (ref 65–99)
POTASSIUM: 3.9 mmol/L (ref 3.5–5.1)
SODIUM: 140 mmol/L (ref 135–145)

## 2015-10-14 MED ORDER — KCL IN DEXTROSE-NACL 20-5-0.9 MEQ/L-%-% IV SOLN
INTRAVENOUS | Status: DC
  Administered 2015-10-14 – 2015-10-16 (×3): via INTRAVENOUS

## 2015-10-14 MED ORDER — POTASSIUM CHLORIDE CRYS ER 10 MEQ PO TBCR
10.0000 meq | EXTENDED_RELEASE_TABLET | Freq: Every day | ORAL | Status: DC
  Administered 2015-10-15: 10 meq via ORAL
  Filled 2015-10-14: qty 1

## 2015-10-14 NOTE — Progress Notes (Signed)
  Subjective:  Patient states she is not getting enough food. She wants to have regular meals. She is agreeable to having 6 small meals since she has had bariatric surgery. She denies nausea dysphagia or abdominal pain. She also denies melena. She states slow her extremity edema is not going down. She states she has not had any alcohol in 6 weeks.   Objective: Blood pressure 106/63, pulse 91, temperature 98.4 F (36.9 C), temperature source Oral, resp. rate 20, height 5\' 8"  (1.727 m), weight 176 lb 1.6 oz (79.878 kg), last menstrual period 08/29/2015, SpO2 99 %. Patient is alert and in no acute distress. She does not have asterixis. Conjunctiva is pink. Sclera is icteric Abdomen is full but soft and nontender without organomegaly or masses. She has 3+ pitting edema to both legs will focal erythema and ecchymoses.  Labs/studies Results:   Recent Labs  10/12/15 0606 10/13/15 0637 10/14/15 0649  WBC 7.0 6.0 6.3  HGB 8.1* 7.8* 11.0*  HCT 23.4* 22.9* 32.1*  PLT 112* 122* 133*    BMET   Recent Labs  10/12/15 0606 10/13/15 0637 10/14/15 0649  NA 140 139 140  K 3.1* 3.4* 3.9  CL 99* 99* 100*  CO2 30 30 28   GLUCOSE 75 70 64*  BUN 5* 5* 5*  CREATININE 1.09* 1.24* 1.34*  CALCIUM 7.9* 8.0* 8.4*    LFT   Recent Labs  10/12/15 0951 10/13/15 0630  PROT 5.4* 4.8*  ALBUMIN 3.1* 2.7*  AST 69* 66*  ALT 29 24  ALKPHOS 100 80  BILITOT 12.6* 12.0*  BILIDIR 6.7* 6.2*  IBILI 5.9* 5.8*    PT/INR   Recent Labs  10/12/15 0951  LABPROT 22.1*  INR 1.95*     Assessment:  #1. Anemia secondary to upper GI bleed from gastritis. Status post EGD 2 days ago. Patient has received 4 units of PRBCs. No evidence of active GI bleed. #2. Alcohol-induced cholestatic liver disease. No parameters of recovery so far. Will reassess discriminant function tomorrow.   Recommendations:  Six small meals daily. Patient will have LFTs and INR in a.m.

## 2015-10-14 NOTE — Progress Notes (Signed)
Pt ambulated two laps around unit w/standard walker and NT at her side.  Tolerated well.

## 2015-10-14 NOTE — Progress Notes (Signed)
PROGRESS NOTE  Albesa Seenarry L Yiu ZOX:[id number]:[id number] DOB: [birth year] DOA: 09/28/2015 [facility]  [age] yof with hx of EtOH abuse presented with LE cellulitis. She was treated with abx and cellulitis has since improved. Pt does have significant (2-3+) edema in BLE. Currently we are trying to diuresis her. Her H/H has been low but not low enough to require transfusion until 10/11/15.   Assessment/Plan: Bilateral lower extremity cellulitis and edema Patient was started on broad-spectrum antibiotics with Zosyn and vancomycin for several days. As her cellulitis regressed, Zosyn and vancomycin were discontinued in favor of doxycycline. The course of doxycycline was completed. -There still appears to be some mild erythema of both legs, but this is likely related to the edema. -Continue SCDs for compression purposes and supportive treatment.   Early sepsis considered on admission with elevated lactic acid and hypotension.  Elevated lactic acid was likely more related to dehydration, malnutrition and poor oral intake. Blood cultures and urine culture were negative. Antibiotics as above were given and discontinued.  Chronically low blood pressure/hypotension. Due to malnutrition and hepatopathy. Midodrine was started. Her blood pressure still trended down , which was likely the consequence of GI bleeding. She was transfused.  -We'll hold diuretics for another day or 2.  Macrocytic-normocytic-acute blood loss anemia; secondary to gastric hemorrhage. Patient's hemoglobin was 9.3 on admission. It has drifted down gradually and was 6.4 on 10/11/15. Patient has a history of intermittent bright red blood per rectum and melena, but denied it during the hospital course. -Anemia panel ordered. Total iron was 54, ferritin 45, B12 greater than 3000. - IV heparin discontinued on 10/11/15 in light of the decrease in H/H. SCDs started for DVT prophylaxis. Protonix increased to twice a day. -Patient  transfused 2 units of packed red blood cells with improvement in her hemoglobin to 8.7. It has drifted down to 7.8. In light of her relative hypotension and decrease in H/H, she was transfused another 2 units of packed red blood cells on 10/13/15. Her hemoglobin improved to 11.0. -Gastroenterology was reconsulted. Dr. Jena Gaussourk performed EGD on 10/12/15. 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 He suspected spontaneous bleeding coming from the stomach intermittently; status post injection therapy for hemostasis; status post gastric biopsy. -PPI increased to twice a day. Carafate added by GI. -Her diet has been advanced. We'll continue to monitor her over the next day or 2 for decompensation.  Thrombocytopenia  Patient's platelet count was 123 on admission. Thrombocytopenia likely from her chronic liver disease and alcohol abuse. Her platelet count drifted down to 109. -Vitamin B12 and TSH were ordered for further evaluation. Vitamin B12 was greater than 3000. TSH was mildly elevated at 6.5. -Subcutaneous heparin was discontinued. Her platelet count has improved progressively.  Elevated LFTs with hyperbilirubinemia secondary to acute on chronic alcoholic hepatitis in underlying severe diffuse hepatic steatosis  seen on CT 08/2015.  Gastroenterology was consulted. Steroids contraindicated secondary to infection on admission per GI. Probiotic recommended daily ; she was started on acidophilus capsules. -Patient's LFTs have drifted down, but she still has significant hyperbilirubinemia. -Continue supportive treatment; will hold diuretics.  Anasarca, secondary to poor nutrition, hypoalbuminemia.  Patient presented with global anasarca and 3-4+ bilateral lower extremity pitting edema.  2-D echocardiogram ordered and revealed preserved left ventricular EF and no significant diastolic dysfunction. -She was  started on IV Lasix and fluid restriction. Albumin was ordered to aid with diuresis and to  attempt to replace blood albumin for oncotic pressure. -Metolazone  Had also been added. -There have been some decrease in her peripheral edema per patient. SCDs ordered to help with mobilization of fluid out of her legs has helped. She has -9.2 L. -Due to her soft blood pressures and increase in hyperbilirubinemia and increase in her creatinine, Lasix and metolazone are being held, starting 10/13/15.    Acute kidney injury. Patient's renal function was within normal limits on admission. With diuresis, her creatinine has progressively increased. -Metolazone and Lasix were held yesterday with exception of Lasix being given between units of blood. -We'll start gentle IV fluids for the next 24-48 hours and monitor her renal function and her urine output.  Milld hypoglycemia.  Patient's venous glucose was 64 on 10/14/15. She was asymptomatic. She had been on clear liquids for the past 24-36 hours which may have contributed to her hypoglycemia. Her diet was advanced per GI. We'll add dextrose to gentle IV fluids.  Alcohol dependence/abuse Patient drinks 1 pint of vodka daily. CIWA protocol and vitamin therapy were ordered. She was strongly advised to stop drinking completely. There has been no evidence of alcohol withdrawal.   Mildly elevated TSH. Not clinically hypothyroidism. Recommend follow-up TSH and added free T4 in 3-6 months.  Tobacco use disorder. Counseled on cessation.  Hypokalemia, secondary to diuretic therapy. Potassium chloride started. The dose was increased. Serum potassium improving. Consider adding spironolactone.  -we'll decrease the dose of potassium while diuretics are held.   Code Status: Full DVT prophylaxis: Heparin-discontinued 10/11/15 and started SCDs. Family Communication:  Discussed with patient; family not available. Disposition Plan: Discharge  To home when clinically  improved, hopefully over the next couple days.  Consultants:  Gastroenterology, signed off; reconsulted for blood loss anemia workup on 10/12/15.  Procedures: 1.) EGD by Dr. Jena Gauss 10/12/15: Normal esophagus. Surgically altered, with Billroth II configuration. 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 Marcated friability. Suspect spontaneous bleeding coming from the stomach intermittently. Status post gastric biopsy. Status post injection therapy for hemostasis.  2.) 2-D echo-Study Conclusions - Left ventricle: The cavity size was normal. Wall thickness was  normal. Systolic function was vigorous. The estimated ejection  fraction was in the range of 65% to 70%. Left ventricular  diastolic function parameters were normal. - Aortic valve: Valve area (VTI): 2.55 cm^2. Valve area (Vmax):  2.44 cm^2. - Technically difficult study.  Antibiotics:  Zosyn 3/16>>3/20  Vanc 3/16 >>3/20  Doxycycline 3/20>>3/26  HPI/Subjective: Patient denies symptoms of low blood sugar. She was witnessed to eat all of her lunch.  Objective: Filed Vitals:   10/13/15 1827 10/13/15 1859 10/13/15 2130 10/14/15 0547  BP: 112/59 96/54 98/55  106/63  Pulse: 98 84 82 91  Temp: 98.5 F (36.9 C) 98.4 F (36.9 C) 98.4 F (36.9 C) 98.4 F (36.9 C)  TempSrc: Oral Oral Oral Oral  Resp: Height:      Weight:    79.878 kg (176 lb 1.6 oz)  SpO2: 98% 96% 97% 99%    Intake/Output Summary (Last 24 hours) at 10/14/15 1419 Last data filed at 10/14/15 0650  Gross per 24 hour  Intake    575 ml  Output    900 ml  Net   -325 ml     Filed Weights   10/12/15 0500 10/12/15 1137 10/14/15 0547  Weight: 78.472 kg (173 lb) 78.472 kg (173 lb) 79.878 kg (176 lb 1.6 oz)    Exam  General:45 year old Caucasian woman who appears to be older than her stated age.   Skin diffusely jaundiced.  Cardiovascular: S1, S2, no murmurs rubs or gallops.   Respiratory:  Clear anteriorly with decreased breath sounds in the bases.  Abdomen: Positive bowel sounds, soft, nontender, nondistended.  Musculoskeletal: 2+ bilateral lower extremity edema, pitting; progressive decrease in lower extremity edema.  Neurologic: She is alert and oriented 3. Cranial nerves II through XII are grossly intact.   Scheduled Meds: . sodium chloride   Intravenous Once  . acidophilus  2 capsule Oral Q breakfast  . feeding supplement  1 Container Oral TID BM  . folic acid  1 mg Oral Daily  . midodrine  5 mg Oral TID WC  . multivitamin with minerals  1 tablet Oral Daily  . nystatin   Topical TID  . ondansetron  4 mg Oral TID AC & HS  . pantoprazole  40 mg Oral BID AC  . [START ON 10/15/2015] potassium chloride  10 mEq Oral Daily  . sodium chloride flush  10-40 mL Intracatheter Q12H  . sodium chloride flush  3 mL Intravenous Q12H  . sucralfate  1 g Oral TID WC & HS  . thiamine  100 mg Oral Daily   Or  . thiamine  100 mg Intravenous Daily   Continuous Infusions: . dextrose 5 % and 0.9 % NaCl with KCl 20 mEq/L      Principal Problem:   Lower extremity cellulitis Active Problems:   Acute blood loss anemia   Mucosal abnormality of stomach   Gastric erosion   Alcoholic hepatitis without ascites   AKI (acute kidney injury) (HCC)   Alcohol dependence (HCC)   Depression   Gastric bypass status for obesity   Elevated LFTs   Tobacco use disorder   Alcoholic hepatitis   Hypotension   Thrombocytopenia (HCC)   Malnutrition of moderate degree   Time spent 30 minutes    Elliot Cousin, M.D.  [facility]  Pager : [phone]  If 7PM-7AM, please contact night-coverage at [email or web address], password [facility] 10/08/2015, 7:23 AM  LOS: 10 days

## 2015-10-15 DIAGNOSIS — K7689 Other specified diseases of liver: Secondary | ICD-10-CM

## 2015-10-15 HISTORY — DX: Other specified diseases of liver: K76.89

## 2015-10-15 LAB — COMPREHENSIVE METABOLIC PANEL
ALBUMIN: 2.5 g/dL — AB (ref 3.5–5.0)
ALK PHOS: 78 U/L (ref 38–126)
ALT: 28 U/L (ref 14–54)
ANION GAP: 7 (ref 5–15)
AST: 81 U/L — ABNORMAL HIGH (ref 15–41)
BUN: 5 mg/dL — ABNORMAL LOW (ref 6–20)
CALCIUM: 8.2 mg/dL — AB (ref 8.9–10.3)
CO2: 28 mmol/L (ref 22–32)
Chloride: 104 mmol/L (ref 101–111)
Creatinine, Ser: 1.18 mg/dL — ABNORMAL HIGH (ref 0.44–1.00)
GFR calc Af Amer: >60 mL/min (ref >60)
GFR calc non Af Amer: 56 mL/min — ABNORMAL LOW (ref >60)
GLUCOSE: 109 mg/dL — AB (ref 65–99)
Potassium: 3.5 mmol/L (ref 3.5–5.1)
SODIUM: 139 mmol/L (ref 135–145)
Total Bilirubin: 15.4 mg/dL — ABNORMAL HIGH (ref 0.3–1.2)
Total Protein: 5 g/dL — ABNORMAL LOW (ref 6.5–8.1)

## 2015-10-15 LAB — CBC
HCT: 29.9 % — ABNORMAL LOW (ref 36.0–46.0)
HEMOGLOBIN: 10.5 g/dL — AB (ref 12.0–15.0)
MCH: 32.3 pg (ref 26.0–34.0)
MCHC: 35.1 g/dL (ref 30.0–36.0)
MCV: 92 fL (ref 78.0–100.0)
Platelets: 98 10*3/uL — ABNORMAL LOW (ref 150–400)
RBC: 3.25 MIL/uL — AB (ref 3.87–5.11)
RDW: 19.8 % — ABNORMAL HIGH (ref 11.5–15.5)
WBC: 6 10*3/uL (ref 4.0–10.5)

## 2015-10-15 LAB — PROTIME-INR
INR: 2.07 — ABNORMAL HIGH (ref 0.00–1.49)
Prothrombin Time: 23.2 seconds — ABNORMAL HIGH (ref 11.6–15.2)

## 2015-10-15 MED ORDER — POTASSIUM CHLORIDE CRYS ER 20 MEQ PO TBCR: 20.0000 meq | EXTENDED_RELEASE_TABLET | Freq: Two times a day (BID) | ORAL | Status: DC

## 2015-10-15 MED ORDER — PREDNISOLONE 5 MG PO TABS
30.0000 mg | ORAL_TABLET | Freq: Every day | ORAL | Status: DC
Start: 2015-10-15 — End: 2015-10-15
  Filled 2015-10-15 (×2): qty 6

## 2015-10-15 MED ORDER — PREDNISOLONE SODIUM PHOSPHATE 15 MG/5ML PO SOLN
30.0000 mg | Freq: Every day | ORAL | Status: DC
  Administered 2015-10-15 – 2015-10-17 (×3): 30 mg via ORAL
  Filled 2015-10-15 (×4): qty 10

## 2015-10-15 MED ORDER — POTASSIUM CHLORIDE CRYS ER 20 MEQ PO TBCR
20.0000 meq | EXTENDED_RELEASE_TABLET | Freq: Two times a day (BID) | ORAL | Status: DC
  Administered 2015-10-15 – 2015-10-17 (×4): 20 meq via ORAL
  Filled 2015-10-15 (×5): qty 1

## 2015-10-15 MED ORDER — VITAMIN K1 10 MG/ML IJ SOLN
10.0000 mg | Freq: Once | INTRAMUSCULAR | Status: AC
  Administered 2015-10-15: 10 mg via SUBCUTANEOUS
  Filled 2015-10-15: qty 1

## 2015-10-15 NOTE — Progress Notes (Signed)
PROGRESS NOTE  Jennifer [name] ZOX:[id number] DOB: [birth year] DOA: 09/28/2015 PCP: [facility]  45 yof with hx of EtOH abuse presented with LE cellulitis. She was treated with abx and cellulitis has since improved. Pt does have significant (2-3+) edema in BLE. Currently we are trying to diuresis her. Her H/H has been low but not low enough to require transfusion until 10/11/15.   Assessment/Plan: Bilateral lower extremity cellulitis and edema Patient was started on broad-spectrum antibiotics with Zosyn and vancomycin for several days. As her cellulitis regressed, Zosyn and vancomycin were discontinued in favor of doxycycline. The course of doxycycline was completed. -There still appears to be some mild erythema of both legs, but this is likely related to the edema. -Continue SCDs for compression purposes and supportive treatment.   Early sepsis considered on admission with elevated lactic acid and hypotension.  Elevated lactic acid was likely more related to dehydration, malnutrition and poor oral intake. Blood cultures and urine culture were negative. Antibiotics as above were given and discontinued.  Chronically low blood pressure/hypotension. Due to malnutrition and hepatopathy. Midodrine was started. Her blood pressure still trended down , which was likely the consequence of GI bleeding. She was transfused. -Lasix is being held for now; gentle IV fluids started.  Macrocytic-normocytic-acute blood loss anemia; secondary to gastric hemorrhage. Patient's hemoglobin was 9.3 on admission. It has drifted down gradually and was 6.4 on 10/11/15. Patient has a history of intermittent bright red blood per rectum and melena, but denied it during the hospital course. -Anemia panel ordered. Total iron was 54, ferritin 45, B12 greater than 3000. - IV heparin discontinued on 10/11/15 in light of the decrease in H/H. SCDs started for DVT prophylaxis. Protonix increased to twice a  day. -Patient transfused a total of 4 units of packed red blood cells when her hemoglobin dropped to 6.4. -Gastroenterology was reconsulted. Dr. Jena Gauss performed EGD on 10/12/15. 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 He suspected spontaneous bleeding coming from the stomach intermittently; status post injection therapy for hemostasis; status post gastric biopsy. -PPI increased to twice a day. Carafate added by GI. -Her diet has been advanced. We'll continue to monitor her over the next day or 2 for decompensation.  Thrombocytopenia  Patient's platelet count was 123 on admission. Thrombocytopenia likely from her chronic liver disease and alcohol abuse. Her platelet count drifted down to 109. -Vitamin B12 and TSH were ordered for further evaluation. Vitamin B12 was greater than 3000. TSH was mildly elevated at 6.5. -Subcutaneous heparin was discontinued. Her platelet count has improved progressively.  Elevated LFTs with hyperbilirubinemia secondary to acute on chronic alcoholic hepatitis in setting of underlying severe diffuse hepatic steatosis seen on CT 08/2015.  Gastroenterology was consulted. Steroids initially contraindicated secondary to infection on admission per GI. Probiotic recommended daily ; she was started on acidophilus capsules. -Patient's LFTs have drifted down, but she still has significant hyperbilirubinemia. -Dr. Karilyn Cota recommended starting steroid therapy for progressive cholestasis an for discriminate score of 66.9. -Ammonia level ordered and pending.   Coagulopathy secondary to alcoholic hepatitis. -Vitamin K 1 ordered per  Dr. Dionicia Abler on 10/15/15.  Anasarca, secondary to poor nutrition, hypoalbuminemia.  Patient presented with global anasarca and 3-4+ bilateral lower extremity pitting edema.  2-D echocardiogram ordered and revealed preserved left ventricular EF and no  significant diastolic dysfunction. -She was started on IV Lasix and fluid restriction. Albumin was ordered to aid with diuresis  and to attempt to replace blood albumin for oncotic pressure. -Metolazone  Had also been added. -There have been some decrease in her peripheral edema per patient. SCDs ordered to help with mobilization of fluid out of her legs has helped. She has -9.2 L. -Due to her soft blood pressures and increase in hyperbilirubinemia and increase in her creatinine, Lasix and metolazone are being held, starting 10/13/15.    Acute kidney injury. Patient's renal function was within normal limits on admission. With diuresis, her creatinine has progressively increased. -Metolazone and Lasix were held yesterday with exception of Lasix being given between units of blood. -Gentle IV fluids started on 10/14/15. There has been some improvement in her renal function. We'll continue gentle IV fluids for another 24 hour-48 hours.  Milld hypoglycemia.  Patient's venous glucose was 64 on 10/14/15. She was asymptomatic. She had been on clear liquids for the past 24-36 hours which may have contributed to her hypoglycemia. Her diet was advanced per GI. Dextrose was added to gentle IV fluids.  Alcohol dependence/abuse Patient drinks 1 pint of vodka daily. CIWA protocol and vitamin therapy were ordered. She was strongly advised to stop drinking completely. There has been no evidence of alcohol withdrawal.   Mildly elevated TSH. Not clinically hypothyroidism. Recommend follow-up TSH and added free T4 in 3-6 months.  Tobacco use disorder. Counseled on cessation.  Hypokalemia, secondary to diuretic therapy. Potassium chloride started.  Serum potassium is better. Continue monitoring.   Code Status: Full DVT prophylaxis: Heparin-discontinued 10/11/15 and started SCDs. Family Communication:  Discussed with patient; family not available. Disposition Plan: Discharge  To home when clinically improved,  hopefully over the next couple days.  Consultants:  Gastroenterology, signed off; reconsulted for blood loss anemia workup on 10/12/15.  Procedures: 1.) EGD by Dr. Jena Gauss 10/12/15: Normal esophagus. Surgically altered, with Billroth II configuration. 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 Marcated friability. Suspect spontaneous bleeding coming from the stomach intermittently. Status post gastric biopsy. Status post injection therapy for hemostasis.  2.) 2-D echo-Study Conclusions - Left ventricle: The cavity size was normal. Wall thickness was  normal. Systolic function was vigorous. The estimated ejection  fraction was in the range of 65% to 70%. Left ventricular  diastolic function parameters were normal. - Aortic valve: Valve area (VTI): 2.55 cm^2. Valve area (Vmax):  2.44 cm^2. - Technically difficult study.  Antibiotics:  Zosyn 3/16>>3/20  Vanc 3/16 >>3/20  Doxycycline 3/20>>3/26  HPI/Subjective: Patient denies chest pain, shortness of breath, or abdominal pain.  Objective: Filed Vitals:   10/14/15 0547 10/14/15 1600 10/14/15 2200 10/15/15 0541  BP: 106/63 101/62 120/50 102/67  Pulse: 91 92 100 91  Temp: 98.4 F (36.9 C)  98.7 F (37.1 C) 98 F (36.7 C)  TempSrc: Oral  Oral Oral  Resp: 20   20  Height:      Weight: 79.878 kg (176 lb 1.6 oz)   80.468 kg (177 lb 6.4 oz)  SpO2: 99% 100% 100% 100%    Intake/Output Summary (Last 24 hours) at 10/15/15 1300 Last data filed at 10/15/15 0541  Gross per 24 hour  Intake    240 ml  Output    800 ml  Net   -560 ml     Filed Weights   10/12/15 1137 10/14/15 0547 10/15/15 0541  Weight: 78.472 kg (173 lb) 79.878 kg (176 lb 1.6 oz) 80.468 kg (177 lb 6.4 oz)    Exam  General:45 year old Caucasian woman who appears to be  older than her stated age.    Pupils: Scleral icterus bilaterally.  Skin diffusely jaundiced; With some progression.  Cardiovascular: S1, S2,  no murmurs rubs or gallops.   Respiratory: Clear anteriorly with decreased breath sounds in the bases.  Abdomen: Positive bowel sounds, soft, nontender, nondistended.  Musculoskeletal: 2+ bilateral lower extremity edema, pitting; progressive decrease in lower extremity edema.  Neurologic:  Nonfocal; no evidence of hepatic encephalopathy.   Scheduled Meds: . acidophilus  2 capsule Oral Q breakfast  . feeding supplement  1 Container Oral TID BM  . folic acid  1 mg Oral Daily  . midodrine  5 mg Oral TID WC  . multivitamin with minerals  1 tablet Oral Daily  . nystatin   Topical TID  . ondansetron  4 mg Oral TID AC & HS  . pantoprazole  40 mg Oral BID AC  . potassium chloride  10 mEq Oral Daily  . prednisoLONE  30 mg Oral QAC breakfast  . sodium chloride flush  10-40 mL Intracatheter Q12H  . sodium chloride flush  3 mL Intravenous Q12H  . sucralfate  1 g Oral TID WC & HS  . thiamine  100 mg Oral Daily   Or  . thiamine  100 mg Intravenous Daily   Continuous Infusions: . dextrose 5 % and 0.9 % NaCl with KCl 20 mEq/L 65 mL/hr at 10/14/15 1508    Principal Problem:   Lower extremity cellulitis Active Problems:   Acute blood loss anemia   Mucosal abnormality of stomach   Gastric erosion   Alcoholic hepatitis without ascites   AKI (acute kidney injury) (HCC)   Alcohol dependence (HCC)   Depression   Gastric bypass status for obesity   Elevated LFTs   Tobacco use disorder   Alcoholic hepatitis   Hypotension   Thrombocytopenia (HCC)   Malnutrition of moderate degree   Time spent 25 minutes    Elliot Cousinenise Hedy Garro, M.D.  [facility]  Pager : [phone]  If 7PM-7AM, please contact night-coverage at [email or web address], password [email or web address] 10/08/2015, 7:23 AM  LOS: 10 days

## 2015-10-15 NOTE — Progress Notes (Signed)
  Subjective:  Patient has no complaints. She states she has good appetite. She is eating most of her meals. She denies melena or rectal bleeding. She feels lower extremity edema has decreased since admission but not in the last 2 days.  Objective: Blood pressure 102/67, pulse 91, temperature 98 F (36.7 C), temperature source Oral, resp. rate 20, height 5\' 8"  (1.727 m), weight 177 lb 6.4 oz (80.468 kg), last menstrual period 08/29/2015, SpO2 100 %. Patient is alert and does not have asterixis. She is jaundiced. Abdomen is symmetrical and soft. She has from enlarged liver which is mildly tender. Liver edge is 10 cm below RCM. Spleen is not palpable. Abdomen;  She has 3+ pitting edema involving both legs. She has some areas focal erythema and small ecchymosis. He has scratch marks over both arms.  Labs/studies Results:   Recent Labs  10/13/15 0637 10/14/15 0649 10/15/15 0655  WBC 6.0 6.3 6.0  HGB 7.8* 11.0* 10.5*  HCT 22.9* 32.1* 29.9*  PLT 122* 133* 98*    BMET   Recent Labs  10/13/15 0637 10/14/15 0649 10/15/15 0655  NA 139 140 139  K 3.4* 3.9 3.5  CL 99* 100* 104  CO2 30 28 28   GLUCOSE 70 64* 109*  BUN 5* 5* 5*  CREATININE 1.24* 1.34* 1.18*  CALCIUM 8.0* 8.4* 8.2*    LFT   Recent Labs  10/13/15 0630 10/15/15 0655  PROT 4.8* 5.0*  ALBUMIN 2.7* 2.5*  AST 66* 81*  ALT 24 28  ALKPHOS 80 78  BILITOT 12.0* 15.4*  BILIDIR 6.2*  --   IBILI 5.8*  --     PT/INR   Recent Labs  10/15/15 0655  LABPROT 23.2*  INR 2.07*     Assessment:  #1.Alcoholic hepatitis with progressive cholestasis. Hepatitis discriminant function now is 66.9. #2. Anemia. I'll decrease in H&H. No evidence of active bleeding. Asian has received 4 units of PRBCs during this admission. Bleeding source felt to be gastritis.  Plan:  Willis check serum ammonia. Vit K 10 mg subcutaneous 1. Begin prednisone 30 mg by mouth every morning.

## 2015-10-16 ENCOUNTER — Encounter (HOSPITAL_COMMUNITY): Payer: Self-pay | Admitting: Internal Medicine

## 2015-10-16 ENCOUNTER — Telehealth: Payer: Self-pay | Admitting: Gastroenterology

## 2015-10-16 LAB — PROTIME-INR
INR: 1.76 — AB (ref 0.00–1.49)
PROTHROMBIN TIME: 20.5 s — AB (ref 11.6–15.2)

## 2015-10-16 LAB — COMPREHENSIVE METABOLIC PANEL
ALBUMIN: 2.6 g/dL — AB (ref 3.5–5.0)
ALK PHOS: 82 U/L (ref 38–126)
ALT: 30 U/L (ref 14–54)
ANION GAP: 10 (ref 5–15)
AST: 80 U/L — ABNORMAL HIGH (ref 15–41)
BILIRUBIN TOTAL: 17.3 mg/dL — AB (ref 0.3–1.2)
BUN: <5 mg/dL — ABNORMAL LOW (ref 6–20)
CALCIUM: 8.3 mg/dL — AB (ref 8.9–10.3)
CO2: 25 mmol/L (ref 22–32)
CREATININE: 1.03 mg/dL — AB (ref 0.44–1.00)
Chloride: 103 mmol/L (ref 101–111)
GFR calc non Af Amer: >60 mL/min (ref >60)
GLUCOSE: 141 mg/dL — AB (ref 65–99)
Potassium: 4.3 mmol/L (ref 3.5–5.1)
SODIUM: 138 mmol/L (ref 135–145)
TOTAL PROTEIN: 5.5 g/dL — AB (ref 6.5–8.1)

## 2015-10-16 LAB — CBC
HEMATOCRIT: 34.3 % — AB (ref 36.0–46.0)
HEMOGLOBIN: 11.8 g/dL — AB (ref 12.0–15.0)
MCH: 32.1 pg (ref 26.0–34.0)
MCHC: 34.4 g/dL (ref 30.0–36.0)
MCV: 93.2 fL (ref 78.0–100.0)
Platelets: 124 10*3/uL — ABNORMAL LOW (ref 150–400)
RBC: 3.68 MIL/uL — ABNORMAL LOW (ref 3.87–5.11)
RDW: 19.5 % — ABNORMAL HIGH (ref 11.5–15.5)
WBC: 8.2 10*3/uL (ref 4.0–10.5)

## 2015-10-16 LAB — AMMONIA: Ammonia: 74 umol/L — ABNORMAL HIGH (ref 9–35)

## 2015-10-16 MED ORDER — LACTULOSE 10 GM/15ML PO SOLN
10.0000 g | Freq: Every day | ORAL | Status: DC
  Administered 2015-10-16 – 2015-10-17 (×2): 10 g via ORAL
  Filled 2015-10-16 (×2): qty 30

## 2015-10-16 NOTE — Telephone Encounter (Signed)
Routing to DS. This is an [facility] pt. 

## 2015-10-16 NOTE — Progress Notes (Signed)
    Subjective: No abdominal pain. No overt GI bleeding. Denies confusion.   Objective: Vital signs in last 24 hours: Temp:  [97.7 F (36.5 C)-98.2 F (36.8 C)] 97.7 F (36.5 C) (04/03 0542) Pulse Rate:  [82-95] 82 (04/03 0542) Resp:  [18-20] 20 (04/03 0542) BP: (83-104)/(52-65) 104/62 mmHg (04/03 0600) SpO2:  [98 %-100 %] 100 % (04/03 0542) Weight:  [177 lb 0.5 oz (80.3 kg)] 177 lb 0.5 oz (80.3 kg) (04/03 0500) Last BM Date: 10/14/15 General:   Alert and oriented, pleasant Eyes:  +scleral icterus  Abdomen:  Bowel sounds present, soft, non-tender, non-distended. +hepatomegaly  Extremities:  With 2-3+ pitting edema, cellulitis improved.  Neurologic:  Alert and  oriented x4; negative asterixis Psych:  Alert and cooperative. Normal mood and affect.  Intake/Output from previous day: 04/02 0701 - 04/03 0700 In: 2175.8 [I.V.:2175.8] Out: 550 [Urine:550] Intake/Output this shift:    Lab Results:  Recent Labs  10/14/15 0649 10/15/15 0655 10/16/15 0604  WBC 6.3 6.0 8.2  HGB 11.0* 10.5* 11.8*  HCT 32.1* 29.9* 34.3*  PLT 133* 98* 124*   BMET  Recent Labs  10/14/15 0649 10/15/15 0655 10/16/15 0604  NA 140 139 138  K 3.9 3.5 4.3  CL 100* 104 103  CO2 28 28 25   GLUCOSE 64* 109* 141*  BUN 5* 5* <5*  CREATININE 1.34* 1.18* 1.03*  CALCIUM 8.4* 8.2* 8.3*   LFT  Recent Labs  10/15/15 0655 10/16/15 0604  PROT 5.0* 5.5*  ALBUMIN 2.5* 2.6*  AST 81* 80*  ALT 28 30  ALKPHOS 78 82  BILITOT 15.4* 17.3*   PT/INR  Recent Labs  10/15/15 0655 10/16/15 0604  LABPROT 23.2* 20.5*  INR 2.07* 1.76*    Assessment: 45 year old female admitted with cellulitis and decompensated liver disease in setting of ETOH hepatitis/sepsis. Discriminant function 30 on admission. Most recent imaging Feb 2017 with severe hepatic steatosis. Steroids were not started on admission due to presence of sepsis and DF less than 32. However, most recent DF greater than 50, with prednisolone  started on 4/2 per Dr. Karilyn Cotaehman.   Anemia: requiring blood transfusion this admission, with EGD noting normal esophagus, surgically altered stomach BIllroth II configuration, portal gastropathy and marked friability, and oozing erosions in the area of the anastomosis. Will need outpatient colonoscopy in near future possibly and surveillance EGD in 1 year.   ETOH hepatitis: increasing bilirubin, now 17.3. May have a lag effect. Patient asked if she was "going to die". Discussed alcohol cessation and mortality rate with ETOH hepatitis. Counseled on complete avoidance of alcohol in the future and close follow-up with our practice. INR improved slightly with one dose of Vit K.   Anticipate discharge in near future; we can recheck her LFTs as an outpatient to ensure she is improving.   Plan: Continue Prednisolone for 30 days with rapid taper thereafter Continue PPI Recheck LFTs as outpatient once discharged Follow-up on surgical pathology once available ETOH cessation   Nira RetortAnna W. Sams, ANP-BC [facility]    LOS: 18 days    10/16/2015, 8:16 AM

## 2015-10-16 NOTE — Progress Notes (Signed)
PROGRESS NOTE  Jennifer [name] NGE:[id number] DOB: 09-30-1970 DOA: 09/28/2015 PCP: Miguel Aschoff [facility] Summary  45 yof with hx of EtOH abuse presented with LE cellulitis. She was treated with abx and cellulitis has since improved. Pt does have significant (2-3+) edema in BLE. Currently we are trying to diuresis her. Her H/H has been low but not low enough to require transfusion until 10/11/15.   Assessment/Plan: Bilateral lower extremity cellulitis and edema Patient was started on broad-spectrum antibiotics with Zosyn and vancomycin for several days. As her cellulitis regressed, Zosyn and vancomycin were discontinued in favor of doxycycline. The course of doxycycline was completed. -She continues to have some mild erythema of both legs, but this is likely related to the edema. -Continue SCDs for compression purposes and supportive treatment.   Early sepsis considered on admission with elevated lactic acid and hypotension.  Elevated lactic acid was likely more related to dehydration, malnutrition and poor oral intake. Blood cultures and urine culture were negative. Antibiotics as above were given and discontinued.  Chronically low blood pressure/hypotension. Due to malnutrition and hepatopathy. Midodrine was started. Her blood pressure still trended down , which was likely the consequence of GI bleeding. She was transfused. -Lasix is being held for now; gentle IV fluids started.  Macrocytic-normocytic-acute blood loss anemia; secondary to gastric hemorrhage. Patient's hemoglobin was 9.3 on admission. It has drifted down gradually and was 6.4 on 10/11/15. Patient has a history of intermittent bright red blood per rectum and melena, but denied it during the hospital course. -Anemia panel ordered. Total iron was 54, ferritin 45, B12 greater than 3000. - IV heparin discontinued on 10/11/15 in light of the decrease in H/H. SCDs started for DVT prophylaxis. Protonix increased to twice a  day. -Patient transfused a total of 4 units of packed red blood cells when her hemoglobin dropped to 6.4. -Gastroenterology was reconsulted. Dr. Jena Gauss performed EGD on 10/12/15. 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 He suspected spontaneous bleeding coming from the stomach intermittently; status post injection therapy for hemostasis; status post gastric biopsy. -PPI increased to twice a day. Carafate added by GI. -Her diet has been advanced. We'll continue to monitor her over the next day or 2 for decompensation.  Thrombocytopenia  Patient's platelet count was 123 on admission. Thrombocytopenia likely from her chronic liver disease and alcohol abuse. Her platelet count drifted down to 109. -Vitamin B12 and TSH were ordered for further evaluation. Vitamin B12 was greater than 3000. TSH was mildly elevated at 6.5. -Subcutaneous heparin was discontinued. Her platelet count has improved progressively.  Elevated LFTs with hyperbilirubinemia/cholestasis secondary to acute on chronic alcoholic hepatitis in setting of underlying severe diffuse hepatic steatosis seen on CT 08/2015.  Gastroenterology was consulted. Steroids initially contraindicated secondary to infection on admission per GI. Probiotic recommended daily ; she was started on acidophilus capsules. -Patient's LFTs have drifted down, but she still has significant hyperbilirubinemia. -Dr. Karilyn Cota recommended starting steroid therapy for progressive cholestasis an for discriminate score of 66.9. -Ammonia level was modestly elevated at 74. She reports hard stools, so will order lactulose.   Coagulopathy secondary to alcoholic hepatitis. -Vitamin K 1 ordered per  Dr. Karilyn Cota on 10/15/15.  Anasarca, secondary to poor nutrition, hypoalbuminemia.  Patient presented with global anasarca and 3-4+ bilateral lower extremity pitting edema.  2-D  echocardiogram ordered and revealed preserved left ventricular EF and no significant diastolic dysfunction. -She was started on IV Lasix and  fluid restriction. Albumin was ordered to aid with diuresis and to attempt to replace blood albumin for oncotic pressure. -Metolazone  Had also been added. -There have been some decrease in her peripheral edema per patient. SCDs ordered to help with mobilization of fluid out of her legs has helped. She has -9.2 L. -Due to her soft blood pressures and increase in hyperbilirubinemia and increase in her creatinine, Lasix and metolazone are being held, starting 10/13/15.    Acute kidney injury. Patient's renal function was within normal limits on admission. With diuresis, her creatinine has progressively increased. -Metolazone and Lasix were held yesterday with exception of Lasix being given between units of blood. -Gentle IV fluids started on 10/14/15. There has been some improvement in her renal function. She was started on gentle IV fluids for approximately 48 hours. Her renal function has improved. Her oral intake has improved. Will discontinue IV fluids.  Milld hypoglycemia.  Patient's venous glucose was 64 on 10/14/15. She was asymptomatic. She had been on clear liquids for the past 24-36 hours which may have contributed to her hypoglycemia. Her diet was advanced per GI. Dextrose was added to gentle IV fluids. Venous glucose has improved.  Alcohol dependence/abuse Patient drinks 1 pint of vodka daily. CIWA protocol and vitamin therapy were ordered. She was strongly advised to stop drinking completely. There has been no evidence of alcohol withdrawal.   Mildly elevated TSH. Not clinically hypothyroidism. Recommend follow-up TSH and added free T4 in 3-6 months.  Tobacco use disorder. Counseled on cessation.  Hypokalemia, secondary to diuretic therapy. Potassium chloride started.  Serum potassium is better. Continue monitoring.   Code Status: Full DVT  prophylaxis: Heparin-discontinued 10/11/15 and started SCDs. Family Communication:  Discussed with patient; family not available. Disposition Plan: Discharge  To home when clinically improved, hopefully over the next couple days.  Consultants:  Gastroenterology, signed off; reconsulted for blood loss anemia workup on 10/12/15.  Procedures: 1.) EGD by Dr. Jena Gaussourk 10/12/15: Normal esophagus. Surgically altered, with Billroth II configuration. 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 Marcated friability. Suspect spontaneous bleeding coming from the stomach intermittently. Status post gastric biopsy. Status post injection therapy for hemostasis.  2.) 2-D echo-Study Conclusions - Left ventricle: The cavity size was normal. Wall thickness was  normal. Systolic function was vigorous. The estimated ejection  fraction was in the range of 65% to 70%. Left ventricular  diastolic function parameters were normal. - Aortic valve: Valve area (VTI): 2.55 cm^2. Valve area (Vmax):  2.44 cm^2. - Technically difficult study.  Antibiotics:  Zosyn 3/16>>3/20  Vanc 3/16 >>3/20  Doxycycline 3/20>>3/26  HPI/Subjective: Patient denies chest pain, shortness of breath, or abdominal pain.  Objective: Filed Vitals:   10/16/15 0542 10/16/15 0600 10/16/15 0927 10/16/15 1528  BP: 83/52 104/62 104/60 100/61  Pulse: 82   80  Temp: 97.7 F (36.5 C)   98.2 F (36.8 C)  TempSrc: Oral   Oral  Resp: 20   20  Height:      Weight:      SpO2: 100%   98%    Intake/Output Summary (Last 24 hours) at 10/16/15 1552 Last data filed at 10/16/15 0618  Gross per 24 hour  Intake 2175.83 ml  Output    550 ml  Net 1625.83 ml     Filed Weights   10/14/15 0547 10/15/15 0541 10/16/15 0500  Weight: 79.878 kg (176 lb 1.6 oz) 80.468 kg (177 lb 6.4 oz) 80.3 kg (177 lb 0.5 oz)  Exam  General:45 year old Caucasian woman who appears to be older than her stated age.     Pupils: Scleral icterus bilaterally.  Skin diffusely jaundiced; With some progression.  Cardiovascular: S1, S2, no murmurs rubs or gallops.   Respiratory: Clear anteriorly with decreased breath sounds in the bases.  Abdomen: Positive bowel sounds, soft, nontender, nondistended.  Musculoskeletal: 2+ bilateral lower extremity edema, pitting; progressive decrease in lower extremity edema.  Neurologic:  Nonfocal; no evidence of hepatic encephalopathy.   Scheduled Meds: . acidophilus  2 capsule Oral Q breakfast  . feeding supplement  1 Container Oral TID BM  . folic acid  1 mg Oral Daily  . midodrine  5 mg Oral TID WC  . multivitamin with minerals  1 tablet Oral Daily  . nystatin   Topical TID  . ondansetron  4 mg Oral TID AC & HS  . pantoprazole  40 mg Oral BID AC  . potassium chloride  20 mEq Oral BID  . prednisoLONE  30 mg Oral QAC breakfast  . sodium chloride flush  10-40 mL Intracatheter Q12H  . sodium chloride flush  3 mL Intravenous Q12H  . sucralfate  1 g Oral TID WC & HS  . thiamine  100 mg Oral Daily   Or  . thiamine  100 mg Intravenous Daily   Continuous Infusions:    Principal Problem:   Lower extremity cellulitis Active Problems:   Alcohol abuse   Alcoholic hepatitis   Acute blood loss anemia   Mucosal abnormality of stomach   Gastric erosion   Intrahepatic cholestasis   Alcoholic hepatitis without ascites   AKI (acute kidney injury) (HCC)   Depression   Gastric bypass status for obesity   Tobacco use disorder   Hypotension   Thrombocytopenia (HCC)   Malnutrition of moderate degree   Time spent 25 minutes    Elliot Cousin, M.D.  [facility]  Pager : [phone]  If 7PM-7AM, please contact night-coverage at [email or web address], password [facility] 10/08/2015, 7:23 AM  LOS: 10 days

## 2015-10-16 NOTE — Telephone Encounter (Signed)
Likely to be discharged on 4/4. Let's have repeat LFTs on Friday.   Please arrange outpatient follow-up in 4-6 weeks.

## 2015-10-17 ENCOUNTER — Other Ambulatory Visit: Payer: Self-pay

## 2015-10-17 ENCOUNTER — Encounter: Payer: Self-pay | Admitting: Internal Medicine

## 2015-10-17 ENCOUNTER — Encounter (HOSPITAL_COMMUNITY): Payer: Self-pay | Admitting: Internal Medicine

## 2015-10-17 DIAGNOSIS — K7030 Alcoholic cirrhosis of liver without ascites: Secondary | ICD-10-CM

## 2015-10-17 DIAGNOSIS — K7689 Other specified diseases of liver: Secondary | ICD-10-CM

## 2015-10-17 DIAGNOSIS — I9589 Other hypotension: Secondary | ICD-10-CM

## 2015-10-17 DIAGNOSIS — B372 Candidiasis of skin and nail: Secondary | ICD-10-CM | POA: Diagnosis present

## 2015-10-17 DIAGNOSIS — E872 Acidosis: Secondary | ICD-10-CM

## 2015-10-17 DIAGNOSIS — F1010 Alcohol abuse, uncomplicated: Secondary | ICD-10-CM

## 2015-10-17 LAB — COMPREHENSIVE METABOLIC PANEL
ALK PHOS: 80 U/L (ref 38–126)
ALT: 31 U/L (ref 14–54)
ANION GAP: 11 (ref 5–15)
AST: 77 U/L — ABNORMAL HIGH (ref 15–41)
Albumin: 2.4 g/dL — ABNORMAL LOW (ref 3.5–5.0)
BILIRUBIN TOTAL: 14.6 mg/dL — AB (ref 0.3–1.2)
BUN: 5 mg/dL — ABNORMAL LOW (ref 6–20)
CALCIUM: 8.6 mg/dL — AB (ref 8.9–10.3)
CO2: 24 mmol/L (ref 22–32)
Chloride: 105 mmol/L (ref 101–111)
Creatinine, Ser: 1.15 mg/dL — ABNORMAL HIGH (ref 0.44–1.00)
GFR calc non Af Amer: 57 mL/min — ABNORMAL LOW (ref >60)
Glucose, Bld: 87 mg/dL (ref 65–99)
Potassium: 4 mmol/L (ref 3.5–5.1)
SODIUM: 140 mmol/L (ref 135–145)
TOTAL PROTEIN: 5 g/dL — AB (ref 6.5–8.1)

## 2015-10-17 MED ORDER — NYSTATIN 100000 UNIT/GM EX OINT: TOPICAL_OINTMENT | Freq: Two times a day (BID) | CUTANEOUS | Status: DC

## 2015-10-17 MED ORDER — CAMPHOR-MENTHOL 0.5-0.5 % EX LOTN: TOPICAL_LOTION | CUTANEOUS | Status: AC | PRN

## 2015-10-17 MED ORDER — PREDNISOLONE SODIUM PHOSPHATE 15 MG/5ML PO SOLN: 30.0000 mg | Freq: Every day | ORAL | Status: DC

## 2015-10-17 MED ORDER — MIDODRINE HCL 5 MG PO TABS: 5.0000 mg | ORAL_TABLET | Freq: Two times a day (BID) | ORAL | Status: DC

## 2015-10-17 MED ORDER — HYDROXYZINE HCL 25 MG PO TABS: 25.0000 mg | ORAL_TABLET | Freq: Three times a day (TID) | ORAL | Status: DC | PRN

## 2015-10-17 MED ORDER — FLUCONAZOLE 100 MG PO TABS
100.0000 mg | ORAL_TABLET | Freq: Every day | ORAL | Status: DC
  Administered 2015-10-17: 100 mg via ORAL
  Filled 2015-10-17: qty 1

## 2015-10-17 MED ORDER — SUCRALFATE 1 GM/10ML PO SUSP: 1.0000 g | Freq: Three times a day (TID) | ORAL | Status: DC

## 2015-10-17 MED ORDER — LACTULOSE 10 GM/15ML PO SOLN: 10.0000 g | Freq: Every day | ORAL | Status: DC

## 2015-10-17 MED ORDER — NYSTATIN 100000 UNIT/GM EX OINT
TOPICAL_OINTMENT | Freq: Two times a day (BID) | CUTANEOUS | Status: DC
  Administered 2015-10-17: 14:00:00 via TOPICAL
  Filled 2015-10-17: qty 15

## 2015-10-17 MED ORDER — THIAMINE HCL 100 MG PO TABS: 100.0000 mg | ORAL_TABLET | Freq: Every day | ORAL | Status: DC

## 2015-10-17 MED ORDER — FLUCONAZOLE 100 MG PO TABS: 100.0000 mg | ORAL_TABLET | Freq: Every day | ORAL | Status: DC

## 2015-10-17 MED ORDER — PANTOPRAZOLE SODIUM 40 MG PO TBEC: 40.0000 mg | DELAYED_RELEASE_TABLET | Freq: Two times a day (BID) | ORAL | Status: DC

## 2015-10-17 NOTE — Telephone Encounter (Signed)
Called and pt is not home yet.

## 2015-10-17 NOTE — Progress Notes (Signed)
Patient refused to ambulate this morning.  States she, "Will walk this afternoon."

## 2015-10-17 NOTE — Discharge Summary (Signed)
Physician Discharge Summary  Albesa Seenarry L Mcartor ZOX:[id number] DOB: September 06, 1970 DOA: 09/28/2015  [facility]  Admit date: 09/28/2015 Discharge date: 10/17/2015  Time spent: Greater than 30 minutes  Recommendations for Outpatient Follow-up:  1. Patient will follow-up with GI, per their schedule and recommendation.  2. Patient was instructed to apply for Medicaid. 3. Recommend follow-up of TSH and free T4 in 3-6 months.   Discharge Diagnoses:  1. Bilateral lower extremity cellulitis with edema. 2. Early sepsis on admission with elevated lactic acid and hypotension. 3. 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 4. Alcoholic hepatitis with severe hepatic steatosis seen on CT 08/2015. 5. Hyperbilirubinemia secondary to cholestasis 6. Mildly elevated ammonia level, no evidence of hepatic encephalopathy. 7. 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 8. Gastric erosion per EGD, status post injection therapy for hemostasis. 9. Coagulopathy secondary to alcoholic hepatitis. 10. Thrombocytopenia secondary to alcoholic hepatitis. 11. Acute kidney injury secondary to prerenal azotemia. 12. Alcohol abuse. 13. Tobacco abuse. 14. Hypokalemia secondary to diuretic therapy. 15. Candidal and suture ago 16. Moderate degree malnutrition. 17. History of gastric bypass surgery for obesity.   Discharge Condition: Stable but chronically ill.   Diet recommendation: diet as tolerated.  Filed Weights   10/15/15 0541 10/16/15 0500 10/17/15 0608  Weight: 80.468 kg (177 lb 6.4 oz) 80.3 kg (177 lb 0.5 oz) 80.4 kg (177 lb 4 oz)    History of present illness:  Patient is a 45 year old woman with a history of alcohol abuse and alcoholic hepatitis and pancreatitis, who presented to the ED on 09/28/2015 with a chief complaint of bilateral leg pain and swelling and redness.  Hospital Course:   Bilateral lower extremity cellulitis  and edema Patient was started on broad-spectrum antibiotics with Zosyn and vancomycin for several days. As her cellulitis regressed, Zosyn and vancomycin were discontinued in favor of doxycycline. The course of doxycycline was completed. -She continued to have some mild erythema of both legs, but this was likely related to the edema. - SCDs for compression purposes and DVT prophylaxis was started.  Early sepsis considered on admission with elevated lactic acid and hypotension.  Elevated lactic acid was likely more related to dehydration, malnutrition and poor oral intake. Blood cultures and urine culture were negative. Antibiotics as above were given and discontinued.  Chronically low blood pressure/hypotension. Due to malnutrition and hepatopathy. Midodrine was started. Her blood pressure still trended down , which was likely the consequence of GI bleeding. She was transfused. Lasix was eventually discontinued.  Macrocytic-normocytic-acute blood loss anemia; secondary to gastric hemorrhage. Patient's hemoglobin was 9.3 on admission. It has drifted down gradually and was 6.4 on 10/11/15. Patient has a history of intermittent bright red blood per rectum and melena, but denied it during the hospital course. -Anemia panel ordered. Total iron was 54, ferritin 45, B12 greater than 3000. - IV heparin was discontinued on 10/11/15 in light of the decrease in H/H. SCDs started for DVT prophylaxis. Protonix increased to twice a day. -Patient transfused a total of 4 units of packed red blood cells. -Gastroenterology was reconsulted. Dr. Jena Gaussourk performed EGD on 10/12/15. 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 He suspected spontaneous bleeding coming from the stomach intermittently; status post injection therapy for hemostasis; status post gastric biopsy. -Carafate added by GI. Patient's diet remained stable  and it was 11.8 at the time of discharge.  Thrombocytopenia  Patient's  platelet count was 123 on admission. Thrombocytopenia was likely from her chronic liver disease and alcohol abuse. Her platelet count drifted down to 109. Vitamin B12 and TSH were ordered for further evaluation. Vitamin B12 was greater than 3000. TSH was mildly elevated at 6.5. Subcutaneous heparin was discontinued. Her platelet count has improved to 124.  Elevated LFTs with hyperbilirubinemia/cholestasis secondary to acute on chronic alcoholic hepatitis in setting of underlying severe diffuse hepatic steatosis seen on CT 08/2015.  Gastroenterology was consulted. Steroids initially contraindicated secondary to infection on admission per GI. Probiotic recommended daily ; she was started on acidophilus capsules. -Patient's LFTs have drifted down, but she still had a significant hyperbilirubinemia. -Dr. Karilyn Cota recommended starting steroid therapy for progressive cholestasis and for discriminate score of 66.9. -Ammonia level was modestly elevated at 74. She reported hard stools, so will order lactulose. -She will follow-up with [facility] with Dr. Jena Gauss per their recommendation.  Coagulopathy secondary to alcoholic hepatitis. -Vitamin K 1 ordered with slight improvement in her INR.  Anasarca, secondary to poor nutrition, hypoalbuminemia.  Patient presented with global anasarca and 3-4+ bilateral lower extremity pitting edema.  2-D echocardiogram ordered and revealed preserved left ventricular EF and no significant diastolic dysfunction. -She was started on IV Lasix and fluid restriction. Albumin was ordered to aid with diuresis and to attempt to replace blood albumin for oncotic pressure. Metolazonewas eventually added. -There have been some decrease in her peripheral edema per patient. SCDs ordered to help with mobilization of fluid out of her legs has helped. She was -7.1 L at discharge. -Due to her soft  blood pressures and increase in hyperbilirubinemia and increase in her creatinine, Lasix and metolazone were discontinued.  Acute kidney injury. Patient's renal function was within normal limits on admission. With diuresis, her creatinine has progressively increased. -Metolazone and Lasix were held yesterday with exception of Lasix being given between units of blood. -Gentle IV fluids started and given for 48 hours. Her creatinine improved.  Milld hypoglycemia. Patient's venous glucose was 64 on 10/14/15. She was asymptomatic. She had been on clear liquids for the past 24-36 hours which may have contributed to her hypoglycemia. Her diet was advanced per GI. Dextrose was added to gentle IV fluids. Venous glucose has improved.  Alcohol dependence/abuse Patient reported drinking 1 pint of vodka daily. CIWA protocol and vitamin therapy were ordered. She was strongly advised to stop drinking completely. There has been no evidence of alcohol withdrawal.   Mildly elevated TSH. Not clinically hypothyroidism. Recommend follow-up TSH and added free T4 in 3-6 months.  Tobacco use disorder. Counseled on cessation.  Hypokalemia, secondary to diuretic therapy. Potassium chloride started. Her serum potassium improved.    Procedures: 1.) EGD by Dr. Jena Gauss 10/12/15: Normal esophagus. Surgically altered, with Billroth II configuration. 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 Marcated friability. Suspect spontaneous bleeding coming from the stomach intermittently. Status post gastric biopsy. Status post injection therapy for hemostasis.  2.) 2-D echo-Study Conclusions - Left ventricle: The cavity size was normal. Wall thickness was  normal. Systolic function was vigorous. The estimated ejection  fraction was in the range of 65% to 70%. Left ventricular  diastolic function parameters were normal. - Aortic valve: Valve area (VTI): 2.55 cm^2. Valve area  (Vmax):  2.44 cm^2. - Technically difficult study.  Consultations:  Gastroenterology  Discharge Exam: Filed Vitals:   10/17/15 1152 10/17/15 1400  BP: 111/64 105/62  Pulse: 89 90  Temp:  98.2 F (36.8 C)  Resp:  20    General: Jaundiced 45 year old Caucasian woman in no acute distress. Cardiovascular: S1, S2, no murmurs rubs or gallops. Respiratory: clear to auscultation bilaterally. Abdomen: Positive bowel sounds, soft, nontender, nondistended. No evidence of ascites. Extremities: Stable 2+ bilateral lower extremity pitting edema with scant erythema. Skin: Over the sacrum and abdominal pannus, there is diffuse excoriation with erythema, consistent with yeast colonization.  Discharge Instructions   Discharge Instructions    Diet general    Complete by:  As directed      Discharge instructions    Complete by:  As directed   DO NOT DRINK.*FAMILY AND FRIENDS FOR HELP WITH YOUR MEDICATIONS.     Increase activity slowly    Complete by:  As directed           Discharge Medication List as of 10/17/2015  4:58 PM    START taking these medications   Details  camphor-menthol (SARNA) lotion Apply topically as needed for itching., Starting 10/17/2015, Until Discontinued, Print    fluconazole (DIFLUCAN) 100 MG tablet Take 1 tablet (100 mg total) by mouth daily. For yeast skin infection., Starting 10/17/2015, Until Discontinued, Print    hydrOXYzine (ATARAX/VISTARIL) 25 MG tablet Take 1 tablet (25 mg total) by mouth 3 (three) times daily as needed for itching., Starting 10/17/2015, Until Discontinued, Print    lactulose (CHRONULAC) 10 GM/15ML solution Take 15 mLs (10 g total) by mouth daily. Medication to keep your ammonia level down. It is also a laxative., Starting 10/17/2015, Until Discontinued, Print    midodrine (PROAMATINE) 5 MG tablet Take 1 tablet (5 mg total) by mouth 2 (two) times daily with a meal. Medication to keep your blood pressure from dropping too low., Starting 10/17/2015,  Until Discontinued, Print    nystatin ointment (MYCOSTATIN) Apply topically 2 (two) times daily. For skin yeast infection., Starting 10/17/2015, Until Discontinued, Print    pantoprazole (PROTONIX) 40 MG tablet Take 1 tablet (40 mg total) by mouth 2 (two) times daily before a meal. For your stomach ulcer., Starting 10/17/2015, Until Discontinued, Print    prednisoLONE (ORAPRED) 15 MG/5ML solution Take 10 mLs (30 mg total) by mouth daily before breakfast. Medication for your liver., Starting 10/17/2015, Until Discontinued, Print    sucralfate (CARAFATE) 1 GM/10ML suspension Take 10 mLs (1 g total) by mouth 4 (four) times daily -  with meals and at bedtime. For your stomach ulcer., Starting 10/17/2015, Until Discontinued, Print    thiamine 100 MG tablet Take 1 tablet (100 mg total) by mouth daily., Starting 10/17/2015, Until Discontinued, No Print      CONTINUE these medications which have NOT CHANGED   Details  Multiple Vitamin (MULTIVITAMIN WITH MINERALS) TABS tablet Take 1 tablet by mouth daily., Starting 10/18/2014, Until Discontinued, OTC      STOP taking these medications     acetaminophen (TYLENOL) 500 MG tablet      cephALEXin (KEFLEX) 500 MG capsule      ibuprofen (ADVIL,MOTRIN) 200 MG tablet      omeprazole (PRILOSEC) 20 MG capsule      promethazine (PHENERGAN) 25 MG tablet      diazepam (VALIUM) 5 MG tablet      ondansetron (ZOFRAN ODT) 4 MG disintegrating tablet      oxyCODONE (OXY IR/ROXICODONE) 5 MG immediate release tablet        Allergies  Allergen Reactions  . Aspirin Other (See Comments)    Cant take due to gastric bypass    Follow-up Information  Follow up with Rondel Baton On 10/26/2015.   Why:  @ 2:15am - take application and requested documentation.    Contact information:   [address] [place] [address] [phone]       Follow up with Eula Listen, MD.   Specialty:  Gastroenterology   Why:  HIS OFFICE WILL CALL YOU WITH THE  APPOINTMENT   Contact information:   [address] [place] [address] [phone]        The results of significant diagnostics from this hospitalization (including imaging, microbiology, ancillary and laboratory) are listed below for reference.    Significant Diagnostic Studies: Dg Chest 2 View  09/28/2015  CLINICAL DATA:  Bilateral leg swelling for a while. History of pancreatitis and smoking. EXAM: CHEST  2 VIEW COMPARISON:  Radiographs 09/01/2015 and 09/18/2015. FINDINGS: Stable right hemidiaphragm elevation. The heart size and mediastinal contours are stable with a recurrent moderate-sized hiatal hernia. Linear right perihilar atelectasis noted on the most recent examination has improved. No confluent airspace opacity, pleural effusion or pneumothorax. IMPRESSION: No acute cardiopulmonary process. Interval improved right perihilar aeration. Electronically Signed   By: Carey Bullocks M.D.   On: 09/28/2015 15:51   Dg Chest 2 View  09/18/2015  CLINICAL DATA:  Productive cough with congestion and shortness of breath. Smoker. EXAM: CHEST  2 VIEW COMPARISON:  Portable chest 09/01/2015.  Abdominal CT 09/01/2015. FINDINGS: The heart size and mediastinal contours are stable. There is a moderate size hiatal hernia. Surgical clips are present within the upper abdomen. There is mild linear scarring or atelectasis in the right perihilar region. No edema, confluent airspace opacity or significant pleural effusion. The bones appear unremarkable. IMPRESSION: No acute cardiopulmonary process. Postsurgical changes from previous gastric bypass with stable moderate size hiatal hernia. Electronically Signed   By: Carey Bullocks M.D.   On: 09/18/2015 13:47    Microbiology: No results found for this or any previous visit (from the past 240 hour(s)).   Labs: Basic Metabolic Panel:  Recent Labs Lab 10/12/15 0606 10/13/15 5784 10/14/15 0649 10/15/15 0655 10/16/15 0604 10/17/15 0530  NA 140 139  140 139 138 140  K 3.1* 3.4* 3.9 3.5 4.3 4.0  CL 99* 99* 100* 104 103 105  CO2 30 30 28 28 25 24   GLUCOSE 75 70 64* 109* 141* 87  BUN 5* 5* 5* 5* <5* 5*  CREATININE 1.09* 1.24* 1.34* 1.18* 1.03* 1.15*  CALCIUM 7.9* 8.0* 8.4* 8.2* 8.3* 8.6*  MG 1.4*  --   --   --   --   --    Liver Function Tests:  Recent Labs Lab 10/12/15 0951 10/13/15 0630 10/15/15 0655 10/16/15 0604 10/17/15 0530  AST 69* 66* 81* 80* 77*  ALT 29 24 28 30 31   ALKPHOS 100 80 78 82 80  BILITOT 12.6* 12.0* 15.4* 17.3* 14.6*  PROT 5.4* 4.8* 5.0* 5.5* 5.0*  ALBUMIN 3.1* 2.7* 2.5* 2.6* 2.4*   No results for input(s): LIPASE, AMYLASE in the last 168 hours.  Recent Labs Lab 10/16/15 0604  AMMONIA 74*   CBC:  Recent Labs Lab 10/12/15 0606 10/13/15 0637 10/14/15 0649 10/15/15 0655 10/16/15 0604  WBC 7.0 6.0 6.3 6.0 8.2  HGB 8.1* 7.8* 11.0* 10.5* 11.8*  HCT 23.4* 22.9* 32.1* 29.9* 34.3*  MCV 92.9 93.1 91.2 92.0 93.2  PLT 112* 122* 133* 98* 124*   Cardiac Enzymes: No results for input(s): CKTOTAL, CKMB, CKMBINDEX, TROPONINI in the last 168 hours. BNP: BNP (last 3  results)  Recent Labs  09/18/15 1454  BNP 126.0*    ProBNP (last 3 results) No results for input(s): PROBNP in the last 8760 hours.  CBG: No results for input(s): GLUCAP in the last 168 hours.     Signed:  Sargon Scouten MD.  [facility] 10/17/2015, 6:28 PM

## 2015-10-17 NOTE — Care Management Note (Signed)
Case Management Note  Patient Details  Name: Jennifer [name] MRN: [id number] Date of Birth: 07/26/1970  Expected Discharge Date:  10/02/15               Expected Discharge Plan:  Home/Self Care  In-House Referral:  NA  Discharge planning Services  CM Consult  Post Acute Care Choice:  NA Choice offered to:  NA  DME Arranged:    DME Agency:     HH Arranged:    HH Agency:     Status of Service:  Completed, signed off  Medicare Important Message Given:    Date Medicare IM Given:    Medicare IM give by:    Date Additional Medicare IM Given:    Additional Medicare Important Message give by:     If discussed at Long Length of Stay Meetings, dates discussed:  10/17/2015  Additional Comments: Pt discharging home today with self care. Pt's walker has been delivered. [facility]. [facility] due to pt's boyfriends non-compliance with providing info about income. Pt understands. Pt not eligible for MATCH as she received a voucher last admission. No further CM needs.   Malcolm Metrohildress, Murrel Bertram Demske, RN 10/17/2015, 3:22 PM

## 2015-10-17 NOTE — Telephone Encounter (Signed)
I spoke to pt's fiance and he said he thinks she will be discharged this afternoon. I told him she needs labs on Friday and I will try to call back tomorrow.

## 2015-10-17 NOTE — Progress Notes (Signed)
Patient's PICC line removed by C. Eliberto IvoryAustin, Charity fundraiserN.  Site WNL.  AVS reviewed with patient.  Patient verbalized understanding of discharge instructions, physician follow-up, medications.  Prescriptions given to patient.  Patient transported by NT via wheelchair to main entrance for discharge.  Patient transported home by significant other.  Patient stable at time of discharge.

## 2015-10-17 NOTE — Progress Notes (Signed)
    Subjective: Denies abdominal pain, N/V. No confusion.   Objective: Vital signs in last 24 hours: Temp:  [97.8 F (36.6 C)-98.2 F (36.8 C)] 98.1 F (36.7 C) (04/04 16100608) Pulse Rate:  [80-89] 89 (04/04 0608) Resp:  [20] 20 (04/04 0608) BP: (100-118)/(60-79) 118/79 mmHg (04/04 0608) SpO2:  [97 %-98 %] 97 % (04/04 0608) Weight:  [177 lb 4 oz (80.4 kg)] 177 lb 4 oz (80.4 kg) (04/04 [id number]) Last BM Date: 10/14/15 General:   Alert and oriented, pleasant. Jaundiced.  Head:  Normocephalic and atraumatic. Eyes:  +scleral icterus  Abdomen:  Bowel sounds present, soft, non-tender, non-distended. +hepatomegaly Psych:  Alert and cooperative. Normal mood and affect.  Intake/Output from previous day: 04/03 0701 - 04/04 0700 In: 1320 [P.O.:720; I.V.:600] Out: 400 [Urine:400] Intake/Output this shift:    Lab Results:  Recent Labs  10/15/15 0655 10/16/15 0604  WBC 6.0 8.2  HGB 10.5* 11.8*  HCT 29.9* 34.3*  PLT 98* 124*   BMET  Recent Labs  10/15/15 0655 10/16/15 0604 10/17/15 0530  NA 139 138 140  K 3.5 4.3 4.0  CL 104 103 105  CO2 28 25 24   GLUCOSE 109* 141* 87  BUN 5* <5* 5*  CREATININE 1.18* 1.03* 1.15*  CALCIUM 8.2* 8.3* 8.6*   LFT  Recent Labs  10/15/15 0655 10/16/15 0604 10/17/15 0530  PROT 5.0* 5.5* 5.0*  ALBUMIN 2.5* 2.6* 2.4*  AST 81* 80* 77*  ALT 28 30 31   ALKPHOS 78 82 80  BILITOT 15.4* 17.3* 14.6*   PT/INR  Recent Labs  10/15/15 0655 10/16/15 0604  LABPROT 23.2* 20.5*  INR 2.07* 1.76*    Assessment: 45 year old female admitted with cellulitis and decompensated liver disease in setting of ETOH hepatitis/sepsis. Discriminant function 30 on admission. Most recent imaging Feb 2017 with severe hepatic steatosis. Steroids were not started on admission due to presence of sepsis and DF less than 32. However, most recent DF greater than 50, with prednisolone started on 4/2.  Anemia: requiring blood transfusion this admission, with EGD noting  normal esophagus, surgically altered stomach BIllroth II configuration, portal gastropathy and marked friability, and oozing erosions in the area of the anastomosis. Path with chronic gastritis, no H.pylori. Will need outpatient colonoscopy in near future possibly and surveillance EGD in 1 year.   ETOH hepatitis:LFTs now improving. Will recheck as outpatient.   Plan: Continue Prednisolone for 30 days total with rapid taper thereafter Continue PPI Recheck LFTs on Friday (our office to arrange) Anticipate discharge today ETOH cessation  Nira RetortAnna W. [name], ANP-BC [facility]     LOS: 19 days    10/17/2015, 7:51 AM

## 2015-10-19 NOTE — Telephone Encounter (Signed)
I called and pt was in bed. I told her fiance to have her go to [facility] tomorrow and he said he would.

## 2015-10-19 NOTE — Telephone Encounter (Signed)
Tried to call. Many rings and no answer.  

## 2015-10-23 NOTE — Telephone Encounter (Signed)
APPT MADE AND LETTER SENT  °

## 2015-10-24 ENCOUNTER — Other Ambulatory Visit: Payer: Self-pay

## 2015-10-24 DIAGNOSIS — K7010 Alcoholic hepatitis without ascites: Secondary | ICD-10-CM

## 2015-10-24 LAB — HEPATIC FUNCTION PANEL
ALBUMIN: 3.1 g/dL — AB (ref 3.6–5.1)
ALK PHOS: 129 U/L — AB (ref 33–115)
ALT: 61 U/L — ABNORMAL HIGH (ref 6–29)
AST: 114 U/L — ABNORMAL HIGH (ref 10–30)
BILIRUBIN TOTAL: 21.8 mg/dL — AB (ref 0.2–1.2)
Bilirubin, Direct: 13.4 mg/dL — ABNORMAL HIGH (ref <=0.2)
Indirect Bilirubin: 8.4 mg/dL — ABNORMAL HIGH (ref 0.2–1.2)
TOTAL PROTEIN: 5.7 g/dL — AB (ref 6.1–8.1)

## 2015-10-24 NOTE — Progress Notes (Signed)
Quick Note:  Pt is aware of test results and to do more labs. ( PT/INR could not be added, so [facility] and the BMP) She said she has not had alcohol for awhile. She is very weak, dizzy and shaking some and said her legs hurt.  ______

## 2015-10-24 NOTE — Progress Notes (Signed)
Quick Note:  Her LFTs have worsened. Is she drinking alcohol??? We need to add a BMP and INR. How is she? ______

## 2015-10-25 LAB — PROTIME-INR
INR: 1.74 — ABNORMAL HIGH (ref <1.50)
PROTHROMBIN TIME: 20.6 s — AB (ref 11.6–15.2)

## 2015-10-25 NOTE — Progress Notes (Signed)
Quick Note:  Noted. We will see what her labs show. ______

## 2015-10-26 ENCOUNTER — Emergency Department (HOSPITAL_COMMUNITY): Payer: Medicaid Other

## 2015-10-26 ENCOUNTER — Encounter (HOSPITAL_COMMUNITY): Payer: Self-pay | Admitting: *Deleted

## 2015-10-26 ENCOUNTER — Inpatient Hospital Stay (HOSPITAL_COMMUNITY)
Admission: EM | Admit: 2015-10-26 | Discharge: 2015-11-01 | DRG: 441 | Disposition: A | Discharge status: Hospice | Payer: Medicaid Other | Attending: Internal Medicine | Admitting: Internal Medicine

## 2015-10-26 DIAGNOSIS — Z79899 Other long term (current) drug therapy: Secondary | ICD-10-CM | POA: Diagnosis not present

## 2015-10-26 DIAGNOSIS — D72829 Elevated white blood cell count, unspecified: Secondary | ICD-10-CM

## 2015-10-26 DIAGNOSIS — K7460 Unspecified cirrhosis of liver: Secondary | ICD-10-CM

## 2015-10-26 DIAGNOSIS — N390 Urinary tract infection, site not specified: Secondary | ICD-10-CM | POA: Diagnosis present

## 2015-10-26 DIAGNOSIS — J181 Lobar pneumonia, unspecified organism: Secondary | ICD-10-CM | POA: Diagnosis present

## 2015-10-26 DIAGNOSIS — Z7952 Long term (current) use of systemic steroids: Secondary | ICD-10-CM

## 2015-10-26 DIAGNOSIS — Z6828 Body mass index (BMI) 28.0-28.9, adult: Secondary | ICD-10-CM

## 2015-10-26 DIAGNOSIS — Z4659 Encounter for fitting and adjustment of other gastrointestinal appliance and device: Secondary | ICD-10-CM

## 2015-10-26 DIAGNOSIS — E860 Dehydration: Secondary | ICD-10-CM | POA: Diagnosis present

## 2015-10-26 DIAGNOSIS — K219 Gastro-esophageal reflux disease without esophagitis: Secondary | ICD-10-CM | POA: Diagnosis present

## 2015-10-26 DIAGNOSIS — K760 Fatty (change of) liver, not elsewhere classified: Secondary | ICD-10-CM | POA: Diagnosis present

## 2015-10-26 DIAGNOSIS — F17210 Nicotine dependence, cigarettes, uncomplicated: Secondary | ICD-10-CM | POA: Diagnosis present

## 2015-10-26 DIAGNOSIS — J189 Pneumonia, unspecified organism: Secondary | ICD-10-CM | POA: Insufficient documentation

## 2015-10-26 DIAGNOSIS — K828 Other specified diseases of gallbladder: Secondary | ICD-10-CM | POA: Diagnosis present

## 2015-10-26 DIAGNOSIS — Z7189 Other specified counseling: Secondary | ICD-10-CM | POA: Insufficient documentation

## 2015-10-26 DIAGNOSIS — K7011 Alcoholic hepatitis with ascites: Secondary | ICD-10-CM | POA: Diagnosis present

## 2015-10-26 DIAGNOSIS — D696 Thrombocytopenia, unspecified: Secondary | ICD-10-CM | POA: Diagnosis present

## 2015-10-26 DIAGNOSIS — Z886 Allergy status to analgesic agent status: Secondary | ICD-10-CM

## 2015-10-26 DIAGNOSIS — K7290 Hepatic failure, unspecified without coma: Secondary | ICD-10-CM | POA: Diagnosis present

## 2015-10-26 DIAGNOSIS — Z9884 Bariatric surgery status: Secondary | ICD-10-CM | POA: Diagnosis not present

## 2015-10-26 DIAGNOSIS — F329 Major depressive disorder, single episode, unspecified: Secondary | ICD-10-CM | POA: Diagnosis present

## 2015-10-26 DIAGNOSIS — Z66 Do not resuscitate: Secondary | ICD-10-CM | POA: Diagnosis not present

## 2015-10-26 DIAGNOSIS — R410 Disorientation, unspecified: Secondary | ICD-10-CM | POA: Diagnosis present

## 2015-10-26 DIAGNOSIS — R748 Abnormal levels of other serum enzymes: Secondary | ICD-10-CM

## 2015-10-26 DIAGNOSIS — K7682 Hepatic encephalopathy: Secondary | ICD-10-CM

## 2015-10-26 DIAGNOSIS — Z515 Encounter for palliative care: Secondary | ICD-10-CM | POA: Insufficient documentation

## 2015-10-26 DIAGNOSIS — Y95 Nosocomial condition: Secondary | ICD-10-CM | POA: Diagnosis present

## 2015-10-26 DIAGNOSIS — F1010 Alcohol abuse, uncomplicated: Secondary | ICD-10-CM | POA: Diagnosis present

## 2015-10-26 DIAGNOSIS — K7469 Other cirrhosis of liver: Secondary | ICD-10-CM

## 2015-10-26 DIAGNOSIS — K861 Other chronic pancreatitis: Secondary | ICD-10-CM | POA: Diagnosis present

## 2015-10-26 DIAGNOSIS — A419 Sepsis, unspecified organism: Secondary | ICD-10-CM | POA: Diagnosis present

## 2015-10-26 DIAGNOSIS — N3000 Acute cystitis without hematuria: Secondary | ICD-10-CM

## 2015-10-26 DIAGNOSIS — R17 Unspecified jaundice: Secondary | ICD-10-CM

## 2015-10-26 LAB — CBC WITH DIFFERENTIAL/PLATELET
BASOS ABS: 0 10*3/uL (ref 0.0–0.1)
Basophils Relative: 0 %
EOS ABS: 0 10*3/uL (ref 0.0–0.7)
EOS PCT: 0 %
HCT: 37.8 % (ref 36.0–46.0)
Hemoglobin: 13.3 g/dL (ref 12.0–15.0)
LYMPHS PCT: 10 %
Lymphs Abs: 2.1 10*3/uL (ref 0.7–4.0)
MCH: 31.9 pg (ref 26.0–34.0)
MCHC: 35.2 g/dL (ref 30.0–36.0)
MCV: 90.6 fL (ref 78.0–100.0)
Monocytes Absolute: 1.4 10*3/uL — ABNORMAL HIGH (ref 0.1–1.0)
Monocytes Relative: 7 %
Neutro Abs: 17.5 10*3/uL — ABNORMAL HIGH (ref 1.7–7.7)
Neutrophils Relative %: 83 %
PLATELETS: 331 10*3/uL (ref 150–400)
RBC: 4.17 MIL/uL (ref 3.87–5.11)
RDW: 20.7 % — ABNORMAL HIGH (ref 11.5–15.5)
WBC: 21 10*3/uL — AB (ref 4.0–10.5)

## 2015-10-26 LAB — URINALYSIS, ROUTINE W REFLEX MICROSCOPIC
GLUCOSE, UA: NEGATIVE mg/dL
Hgb urine dipstick: NEGATIVE
Ketones, ur: 15 mg/dL — AB
NITRITE: POSITIVE — AB
PH: 7.5 (ref 5.0–8.0)
Protein, ur: 100 mg/dL — AB
SPECIFIC GRAVITY, URINE: 1.022 (ref 1.005–1.030)

## 2015-10-26 LAB — COMPREHENSIVE METABOLIC PANEL
ALBUMIN: 3 g/dL — AB (ref 3.5–5.0)
ALT: 77 U/L — AB (ref 14–54)
AST: 98 U/L — AB (ref 15–41)
Alkaline Phosphatase: 147 U/L — ABNORMAL HIGH (ref 38–126)
Anion gap: 12 (ref 5–15)
BUN: 7 mg/dL (ref 6–20)
CALCIUM: 8.9 mg/dL (ref 8.9–10.3)
CHLORIDE: 104 mmol/L (ref 101–111)
CO2: 25 mmol/L (ref 22–32)
CREATININE: 1.02 mg/dL — AB (ref 0.44–1.00)
GFR calc Af Amer: >60 mL/min (ref >60)
GFR calc non Af Amer: >60 mL/min (ref >60)
GLUCOSE: 115 mg/dL — AB (ref 65–99)
Potassium: 4 mmol/L (ref 3.5–5.1)
SODIUM: 141 mmol/L (ref 135–145)
TOTAL PROTEIN: 7 g/dL (ref 6.5–8.1)
Total Bilirubin: 26.5 mg/dL (ref 0.3–1.2)

## 2015-10-26 LAB — I-STAT CG4 LACTIC ACID, ED
LACTIC ACID, VENOUS: 2.59 mmol/L — AB (ref 0.5–2.0)
Lactic Acid, Venous: 2.13 mmol/L (ref 0.5–2.0)

## 2015-10-26 LAB — BASIC METABOLIC PANEL
BUN: 5 mg/dL — AB (ref 7–25)
CHLORIDE: 100 mmol/L (ref 98–110)
CO2: 22 mmol/L (ref 20–31)
Calcium: 8.3 mg/dL — ABNORMAL LOW (ref 8.6–10.2)
Creat: 1.2 mg/dL — ABNORMAL HIGH (ref 0.50–1.10)
GLUCOSE: 93 mg/dL (ref 65–99)
POTASSIUM: 3.9 mmol/L (ref 3.5–5.3)
Sodium: 141 mmol/L (ref 135–146)

## 2015-10-26 LAB — AMMONIA: Ammonia: 100 umol/L — ABNORMAL HIGH (ref 9–35)

## 2015-10-26 LAB — LIPASE, BLOOD: LIPASE: 22 U/L (ref 11–51)

## 2015-10-26 LAB — URINE MICROSCOPIC-ADD ON

## 2015-10-26 LAB — PROTIME-INR
INR: 1.67 — ABNORMAL HIGH (ref 0.00–1.49)
Prothrombin Time: 19.7 seconds — ABNORMAL HIGH (ref 11.6–15.2)

## 2015-10-26 LAB — POC OCCULT BLOOD, ED: FECAL OCCULT BLD: NEGATIVE

## 2015-10-26 LAB — CBG MONITORING, ED
GLUCOSE-CAPILLARY: 92 mg/dL (ref 65–99)
Glucose-Capillary: 102 mg/dL — ABNORMAL HIGH (ref 65–99)

## 2015-10-26 LAB — ETHANOL: Alcohol, Ethyl (B): <5 mg/dL (ref <5)

## 2015-10-26 LAB — APTT: APTT: 40 s — AB (ref 24–37)

## 2015-10-26 MED ORDER — SODIUM CHLORIDE 0.9 % IV BOLUS (SEPSIS)
500.0000 mL | Freq: Once | INTRAVENOUS | Status: AC
  Administered 2015-10-26: 500 mL via INTRAVENOUS

## 2015-10-26 MED ORDER — LACTULOSE ENEMA
300.0000 mL | Freq: Two times a day (BID) | ORAL | Status: DC
  Administered 2015-10-27 (×2): 300 mL via RECTAL
  Filled 2015-10-26 (×5): qty 300

## 2015-10-26 MED ORDER — PIPERACILLIN-TAZOBACTAM 3.375 G IVPB
3.3750 g | Freq: Three times a day (TID) | INTRAVENOUS | Status: DC
  Administered 2015-10-27 – 2015-10-30 (×11): 3.375 g via INTRAVENOUS
  Filled 2015-10-26 (×11): qty 50

## 2015-10-26 MED ORDER — VANCOMYCIN HCL IN DEXTROSE 1-5 GM/200ML-% IV SOLN
1000.0000 mg | Freq: Once | INTRAVENOUS | Status: AC
  Administered 2015-10-26: 1000 mg via INTRAVENOUS
  Filled 2015-10-26: qty 200

## 2015-10-26 MED ORDER — ONDANSETRON HCL 4 MG/2ML IJ SOLN
4.0000 mg | Freq: Four times a day (QID) | INTRAMUSCULAR | Status: DC | PRN
  Filled 2015-10-26: qty 2

## 2015-10-26 MED ORDER — LACTULOSE 10 GM/15ML PO SOLN
20.0000 g | Freq: Once | ORAL | Status: DC
  Filled 2015-10-26: qty 30

## 2015-10-26 MED ORDER — LACTULOSE 10 GM/15ML PO SOLN
30.0000 g | Freq: Once | ORAL | Status: AC
  Administered 2015-10-26: 30 g
  Filled 2015-10-26: qty 45

## 2015-10-26 MED ORDER — HEPARIN SODIUM (PORCINE) 5000 UNIT/ML IJ SOLN
5000.0000 [IU] | Freq: Three times a day (TID) | INTRAMUSCULAR | Status: DC
  Administered 2015-10-27 (×2): 5000 [IU] via SUBCUTANEOUS
  Filled 2015-10-26 (×2): qty 1

## 2015-10-26 MED ORDER — SODIUM CHLORIDE 0.9 % IV BOLUS (SEPSIS)
1000.0000 mL | Freq: Once | INTRAVENOUS | Status: AC
  Administered 2015-10-26: 1000 mL via INTRAVENOUS

## 2015-10-26 MED ORDER — LACTULOSE 10 GM/15ML PO SOLN
30.0000 g | Freq: Three times a day (TID) | ORAL | Status: DC
  Filled 2015-10-26: qty 45

## 2015-10-26 MED ORDER — SODIUM CHLORIDE 0.9% FLUSH
3.0000 mL | Freq: Two times a day (BID) | INTRAVENOUS | Status: DC
  Administered 2015-10-27 – 2015-11-01 (×8): 3 mL via INTRAVENOUS

## 2015-10-26 MED ORDER — SODIUM CHLORIDE 0.9 % IV BOLUS (SEPSIS)
250.0000 mL | Freq: Once | INTRAVENOUS | Status: AC
  Administered 2015-10-26: 250 mL via INTRAVENOUS

## 2015-10-26 MED ORDER — CEFTRIAXONE SODIUM 2 G IJ SOLR: 2.0000 g | Freq: Once | INTRAMUSCULAR | Status: DC

## 2015-10-26 MED ORDER — PIPERACILLIN-TAZOBACTAM 3.375 G IVPB 30 MIN: 3.3750 g | Freq: Four times a day (QID) | INTRAVENOUS | Status: DC

## 2015-10-26 MED ORDER — PIPERACILLIN-TAZOBACTAM 3.375 G IVPB
3.3750 g | Freq: Once | INTRAVENOUS | Status: AC
  Administered 2015-10-26: 3.375 g via INTRAVENOUS
  Filled 2015-10-26: qty 50

## 2015-10-26 MED ORDER — ONDANSETRON HCL 4 MG PO TABS: 4.0000 mg | ORAL_TABLET | Freq: Four times a day (QID) | ORAL | Status: DC | PRN

## 2015-10-26 MED ORDER — SODIUM CHLORIDE 0.9 % IV SOLN
INTRAVENOUS | Status: DC
  Administered 2015-10-26: 19:00:00 via INTRAVENOUS

## 2015-10-26 MED ORDER — VANCOMYCIN HCL IN DEXTROSE 1-5 GM/200ML-% IV SOLN
1000.0000 mg | Freq: Two times a day (BID) | INTRAVENOUS | Status: DC
  Administered 2015-10-27 (×3): 1000 mg via INTRAVENOUS
  Filled 2015-10-26 (×3): qty 200

## 2015-10-26 NOTE — ED Notes (Signed)
Notified primary nurse of patient's critical bilirubin of 26.5

## 2015-10-26 NOTE — ED Notes (Signed)
Jennifer ConroyDAUGHTER- Jennifer [name] [phone].

## 2015-10-26 NOTE — Progress Notes (Signed)
Pharmacy Antibiotic Note  Jennifer [name] is a 45 y.o. female admitted on 10/26/2015 with sepsis/HCAP.  Pharmacy has been consulted for vancomycin/Zosyn dosing. Pt has unclear source of sepsis and was aggressively fluid resuscitated in the ED.  Pt has a past medical history of tobacco abuse, alcohol, history of decompensated liver cirrhosis ,pancreatitis chronic lower extremity edema [facility] for lower extremity cellulitis that is brought in for altered mental status.  Plan: Vancomycin 1000 IV every 12 hours.  Goal trough 15-20 mcg/mL.  Zosyn 3.375 gr IV q8h EI.      Temp (24hrs), Avg:98.2 F (36.8 C), Min:98.1 F (36.7 C), Max:98.2 F (36.8 C)   Recent Labs Lab 10/24/15 1211 10/26/15 1006 10/26/15 1023 10/26/15 1433  WBC  --  21.0*  --   --   CREATININE 1.20* 1.02*  --   --   LATICACIDVEN  --   --  2.59* 2.13*    Estimated Creatinine Clearance: 79.1 mL/min (by C-G formula based on Cr of 1.02).    Allergies  Allergen Reactions  . Aspirin Other (See Comments)    Cant take due to gastric bypass     Antimicrobials this admission: 4/13 Zosyn >>  4/13 vancomycin >>   Dose adjustments this admission: ---  Microbiology results: 4/13 BCx: sent 4/13 UCx: sent    Thank you for allowing pharmacy to be a part of this patient's care.   Adalberto ColeNikola Alonie Gazzola, PharmD, BCPS Pager [phone] 10/26/2015 9:57 PM

## 2015-10-26 NOTE — ED Notes (Signed)
Bed: WA06 Expected date:  Expected time:  Means of arrival:  Comments: EMS- 40s, lethargic/Hx of Hep C

## 2015-10-26 NOTE — ED Notes (Signed)
Per EMS:pt from home with c/o lethargy and AMS; pt's husband reported pt recently diagnosed with hepatitis c, was doing well? Until 2 days ago when he noticed she was becoming more lethargic and not responding well. Upon arrival pt is moaning, she has dried up feces, there are bleeding sores on her entire body, from face to her feet, she is very jaundiced, with abdominal swelling

## 2015-10-26 NOTE — ED Provider Notes (Signed)
CSN: [id number]     Arrival date & time 10/26/15  0902 History   First MD Initiated Contact with Patient 10/26/15 [phone]     Chief Complaint  Patient presents with  . Altered Mental Status     (Consider location/radiation/quality/duration/timing/severity/associated sxs/prior Treatment) HPI  Level V caveat due to AMS. 45 year old female who presents with altered mental status. History of decompensated liver disease secondary to alcohol abuse and hepatitis C. History of gastric bypass and recently discharged from the hospital in early April 2017 after treatment for cellulitis of bilateral lower extremities. Brought in by EMS today for confusion and lethargy since recent diagnosis of HCV. Patient not directable and not answering questions appropriately.  Past Medical History  Diagnosis Date  . Gastric bypass status for obesity   . Pancreatitis   . Depression   . Alcohol abuse   . Shortness of breath dyspnea   . GERD (gastroesophageal reflux disease)   . Gastric erosion 10/12/2015  . Acute blood loss anemia 09/30/2015  . 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 Alcoholic hepatitis without ascites    Past Surgical History  Procedure Laterality Date  . Gastric bypass    . Esophagogastroduodenoscopy (egd) with propofol N/A 10/12/2015    Procedure: ESOPHAGOGASTRODUODENOSCOPY (EGD) WITH PROPOFOL;  Surgeon: Corbin Ade, MD;  [facility];  Service: Endoscopy;  Laterality: N/A;  . Esophageal banding N/A 10/12/2015    Procedure: ESOPHAGEAL BANDING;  Surgeon: Corbin Ade, MD;  [facility];  Service: Endoscopy;  Laterality: N/A;   History reviewed. No pertinent family history. Social History  Substance Use Topics  . Smoking status: Current Every Day Smoker -- 0.50 packs/day    Types: Cigarettes  . Smokeless tobacco: None  . Alcohol Use: Yes     Comment: daily,  vodka  1 pint per day, last time drank alcohol  march 20th 2017   OB History    Gravida Para Term Preterm  AB TAB SAB Ectopic Multiple Living   Review of Systems  Unable to perform ROS: Mental status change      Allergies  Aspirin  Home Medications   Prior to Admission medications   Medication Sig Start Date End Date Taking? Authorizing Provider  camphor-menthol [facility]) lotion Apply topically as needed for itching. 10/17/15   Elliot Cousin, MD  fluconazole (DIFLUCAN) 100 MG tablet Take 1 tablet (100 mg total) by mouth daily. For yeast skin infection. 10/17/15   Elliot Cousin, MD  hydrOXYzine (ATARAX/VISTARIL) 25 MG tablet Take 1 tablet (25 mg total) by mouth 3 (three) times daily as needed for itching. 10/17/15   Elliot Cousin, MD  lactulose (CHRONULAC) 10 GM/15ML solution Take 15 mLs (10 g total) by mouth daily. Medication to keep your ammonia level down. It is also a laxative. 10/17/15   Elliot Cousin, MD  midodrine (PROAMATINE) 5 MG tablet Take 1 tablet (5 mg total) by mouth 2 (two) times daily with a meal. Medication to keep your blood pressure from dropping too low. 10/17/15   Elliot Cousin, MD  Multiple Vitamin (MULTIVITAMIN WITH MINERALS) TABS tablet Take 1 tablet by mouth daily. 10/18/14   Gwenyth Bender, NP  nystatin ointment (MYCOSTATIN) Apply topically 2 (two) times daily. For skin yeast infection. 10/17/15   Elliot Cousin, MD  pantoprazole (PROTONIX) 40 MG tablet Take 1 tablet (40 mg total) by mouth 2 (two) times daily before a meal. For your stomach  ulcer. 10/17/15   Elliot Cousin, MD  prednisoLONE (ORAPRED) 15 MG/5ML solution Take 10 mLs (30 mg total) by mouth daily before breakfast. Medication for your liver. 10/17/15   Elliot Cousin, MD  sucralfate (CARAFATE) 1 GM/10ML suspension Take 10 mLs (1 g total) by mouth 4 (four) times daily -  with meals and at bedtime. For your stomach ulcer. 10/17/15   Elliot Cousin, MD  thiamine 100 MG tablet Take 1 tablet (100 mg total) by mouth daily. 10/17/15   Elliot Cousin, MD   BP 117/81 mmHg  Pulse 111  Temp(Src) 98.2 F (36.8 C) (Rectal)   Resp 26  SpO2 93% Physical Exam Physical Exam  Nursing note and vitals reviewed. Constitutional: Moaning and writhing around in bed, says "what" when name called but not answering questions appropriately Head: Normocephalic and atraumatic.  Mouth/Throat: Oropharynx is clear and moist.  Neck: Normal range of motion. Neck supple.  Eyes: Bilateral scleral icterus Cardiovascular: Normal rate and regular rhythm.   Pulmonary/Chest: Effort normal and breath sounds normal.  Abdominal: Soft. Mild distention There is no tenderness. There is no rebound and no guarding.  Musculoskeletal: Normal range of motion.  Neurological: Alert, no facial droop, moving all extremities but not following commands Skin: Skin is warm and dry.  jaundiced. Erythematous poorly blanching rash over decubitus with excoriation marks. No drainage. Psychiatric: Cooperative  ED Course  Procedures (including critical care time) Labs Review Labs Reviewed  CBC WITH DIFFERENTIAL/PLATELET - Abnormal; Notable for the following:    WBC 21.0 (*)    RDW 20.7 (*)    Neutro Abs 17.5 (*)    Monocytes Absolute 1.4 (*)    All other components within normal limits  COMPREHENSIVE METABOLIC PANEL - Abnormal; Notable for the following:    Glucose, Bld 115 (*)    Creatinine, Ser 1.02 (*)    Albumin 3.0 (*)    AST 98 (*)    ALT 77 (*)    Alkaline Phosphatase 147 (*)    Total Bilirubin 26.5 (*)    All other components within normal limits  PROTIME-INR - Abnormal; Notable for the following:    Prothrombin Time 19.7 (*)    INR 1.67 (*)    All other components within normal limits  APTT - Abnormal; Notable for the following:    aPTT 40 (*)    All other components within normal limits  URINALYSIS, ROUTINE W REFLEX MICROSCOPIC (NOT AT [facility]) - Abnormal; Notable for the following:    Color, Urine RED (*)    APPearance TURBID (*)    Bilirubin Urine LARGE (*)    Ketones, ur 15 (*)    Protein, ur 100 (*)    Nitrite POSITIVE (*)     Leukocytes, UA LARGE (*)    All other components within normal limits  AMMONIA - Abnormal; Notable for the following:    Ammonia 100 (*)    All other components within normal limits  URINE MICROSCOPIC-ADD ON - Abnormal; Notable for the following:    Squamous Epithelial / LPF 0-5 (*)    Bacteria, UA MANY (*)    Crystals BILIRUBIN CRYSTALS (*)    All other components within normal limits  CBG MONITORING, ED - Abnormal; Notable for the following:    Glucose-Capillary 102 (*)    All other components within normal limits  I-STAT CG4 LACTIC ACID, ED - Abnormal; Notable for the following:    Lactic Acid, Venous 2.59 (*)    All other components within normal limits  I-STAT CG4 LACTIC ACID, ED - Abnormal; Notable for the following:    Lactic Acid, Venous 2.13 (*)    All other components within normal limits  CULTURE, BLOOD (ROUTINE X 2)  CULTURE, BLOOD (ROUTINE X 2)  URINE CULTURE  ETHANOL  LIPASE, BLOOD  POC OCCULT BLOOD, ED    Imaging Review Ct Head Wo Contrast  10/26/2015  CLINICAL DATA:  Altered mental status EXAM: CT HEAD WITHOUT CONTRAST TECHNIQUE: Contiguous axial images were obtained from the base of the skull through the vertex without intravenous contrast. COMPARISON:  CT head 03/10/2013 FINDINGS: Mild atrophy given the patient's age.  Negative for hydrocephalus Negative for acute infarct. Negative for intracranial hemorrhage. No mass or edema Left parietal scalp soft tissue thickening has improved since the prior study most likely scarring related to prior infection or trauma. Calvarium intact. IMPRESSION: Mild atrophy.  No acute intracranial abnormality. Electronically Signed   By: Marlan Palau M.D.   On: 10/26/2015 11:51   Dg Chest Portable 1 View  10/26/2015  CLINICAL DATA:  Altered mental status. EXAM: PORTABLE CHEST 1 VIEW COMPARISON:  09/28/2015 FINDINGS: Hypoventilation with decreased lung volume. Elevation right hemidiaphragm again noted. Right lower lobe airspace disease  has progressed and may represent pneumonia or atelectasis. Small right pleural effusion. Mild left lower lobe atelectasis/ infiltrate.  Hiatal hernia. IMPRESSION: Hypoventilation with decreased lung volume Right lower lobe infiltrate suspicious for pneumonia. Small right pleural effusion. Mild left lower lobe atelectasis/ infiltrate also present Hiatal hernia. Electronically Signed   By: Marlan Palau M.D.   On: 10/26/2015 09:50   I have personally reviewed and evaluated these images and lab results as part of my medical decision-making.   EKG Interpretation   Date/Time:  Thursday October 26 2015 09:13:32 EDT Ventricular Rate:  120 PR Interval:  146 QRS Duration: 90 QT Interval:  317 QTC Calculation: 448 R Axis:   72 Text Interpretation:  Age not entered, assumed to be  46 years old for  purpose of ECG interpretation Sinus tachycardia Probable anterior infarct,  age indeterminate Baseline wander in lead(s) V6 No significant change  since last tracing poor wandering baseline  Confirmed by Shanyla Marconi MD, Blair Lundeen  [phone]) on 10/26/2015 12:52:21 PM      CRITICAL CARE Performed by: Lavera Guise  ?  Total critical care time: 30 minutes  Critical care time was exclusive of separately billable procedures and treating other patients.  Critical care was necessary to treat or prevent imminent or life-threatening deterioration.  Critical care was time spent personally by me on the following activities: development of treatment plan with patient and/or surrogate as well as nursing, discussions with consultants, evaluation of patient's response to treatment, examination of patient, obtaining history from patient or surrogate, ordering and performing treatments and interventions, ordering and review of laboratory studies, ordering and review of radiographic studies, pulse oximetry and re-evaluation of patient's condition.   MDM   Final diagnoses:  Hepatic encephalopathy (HCC)  Sepsis, due to unspecified  organism (HCC)  HCAP (healthcare-associated pneumonia)  Lobar pneumonia (HCC)  Acute cystitis without hematuria  Decompensated liver disease (HCC)    45 year old female who presents with altered mental status. She has a history of gastric bypass, alcohol abuse, and hepatitis C. Was recently hospitalized and discharged in early February for bilateral lower extremity cellulitis and new decompensated liver disease. On arrival, she is initially answering simple questions with yes or no answers, but not obeying any commands and is altered. Appears very jaundiced. Is tachycardic,  but afebrile and normotensive. Concern for sepsis with elevated lactate above 2, a leukocytosis of 21,evidence of lobar pneumonia on chest x-ray, and UTI on catheterized urine sample. Received 2 L of IV fluids and vancomycin/Zosyn for antibiotic coverage. CT head negative.  Will place NG tube for lactulose for presumed hepatic encephalopathy. Discussed with Dr. Radonna RickerFeliz who will admit to stepdown given severe AMS.     Lavera Guiseana Duo Dandra Shambaugh, MD 10/26/15 [id number]

## 2015-10-26 NOTE — ED Notes (Addendum)
Garnette CzechSOUDAN BURLINGAME CELL [phone]- MOTHER

## 2015-10-26 NOTE — H&P (Signed)
[facility] History and Physical  Jennifer [name] ZOX:[id number] DOB: 11/09/1970 DOA: 10/26/2015  Referring physician: Dr. Joni Fears PCP: [facility]   Chief Complaint: confusion  HPI: Jennifer [name] is a 45 y.o. female past medical history of tobacco abuse, alcohol, history of decompensated liver cirrhosis ,pancreatitis chronic lower extremity edema recently discharged from [facility] for lower extremity cellulitis that is brought in for altered mental status, there is no family at bedside and I have tried to call her mother and have been unsuccessful so most of the history is obtained from the chart, was brought in by EMS for confusion and lethargy.  In the ED: She was found to have a bilirubin of 26 and ammonia level C, and elevated lactic acid leukocytosis with left shift. We are consulted for evaluation.   Review of Systems:  Constitutional:  No weight loss, night sweats, Fevers, chills, fatigue.  HEENT:  No headaches, Difficulty swallowing,Tooth/dental problems,Sore throat,  No sneezing, itching, ear ache, nasal congestion, post nasal drip,  Cardio-vascular:  No chest pain, Orthopnea, PND, swelling in lower extremities, anasarca, dizziness, palpitations  GI:  No heartburn, indigestion, abdominal pain, nausea, vomiting, diarrhea, change in bowel habits, loss of appetite  Resp:  No shortness of breath with exertion or at rest. No excess mucus, no productive cough, No non-productive cough, No coughing up of blood.No change in color of mucus.No wheezing.No chest wall deformity  Skin:  no rash or lesions.  GU:  no dysuria, change in color of urine, no urgency or frequency. No flank pain.  Musculoskeletal:  No joint pain or swelling. No decreased range of motion. No back pain.  Psych:  No change in mood or affect. No depression or anxiety. No memory loss.   Past Medical History  Diagnosis Date  . Gastric bypass status for obesity   . Pancreatitis   .  Depression   . Alcohol abuse   . Shortness of breath dyspnea   . GERD (gastroesophageal reflux disease)   . Gastric erosion 10/12/2015  . Acute blood loss anemia 09/30/2015  . 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 Alcoholic hepatitis without ascites    Past Surgical History  Procedure Laterality Date  . Gastric bypass    . Esophagogastroduodenoscopy (egd) with propofol N/A 10/12/2015    Procedure: ESOPHAGOGASTRODUODENOSCOPY (EGD) WITH PROPOFOL;  Surgeon: Corbin Ade, MD;  [facility];  Service: Endoscopy;  Laterality: N/A;  . Esophageal banding N/A 10/12/2015    Procedure: ESOPHAGEAL BANDING;  Surgeon: Corbin Ade, MD;  [facility];  Service: Endoscopy;  Laterality: N/A;   Social History:  reports that she has been smoking Cigarettes.  She has been smoking about 0.50 packs per day. She does not have any smokeless tobacco history on file. She reports that she drinks alcohol. She reports that she does not use illicit drugs.  Allergies  Allergen Reactions  . Aspirin Other (See Comments)    Cant take due to gastric bypass      Not obtainable family history of patient is lethargic and cannot answer questions.  Prior to Admission medications   Medication Sig Start Date End Date Taking? Authorizing Provider  camphor-menthol [facility]) lotion Apply topically as needed for itching. 10/17/15   Elliot Cousin, MD  fluconazole (DIFLUCAN) 100 MG tablet Take 1 tablet (100 mg total) by mouth daily. For yeast skin infection. 10/17/15   Elliot Cousin, MD  hydrOXYzine (ATARAX/VISTARIL) 25 MG tablet Take 1 tablet (25 mg total) by  mouth 3 (three) times daily as needed for itching. 10/17/15   Elliot Cousin, MD  lactulose (CHRONULAC) 10 GM/15ML solution Take 15 mLs (10 g total) by mouth daily. Medication to keep your ammonia level down. It is also a laxative. 10/17/15   Elliot Cousin, MD  midodrine (PROAMATINE) 5 MG tablet Take 1 tablet (5 mg total) by mouth 2 (two) times daily with a meal.  Medication to keep your blood pressure from dropping too low. 10/17/15   Elliot Cousin, MD  Multiple Vitamin (MULTIVITAMIN WITH MINERALS) TABS tablet Take 1 tablet by mouth daily. 10/18/14   Gwenyth Bender, NP  nystatin ointment (MYCOSTATIN) Apply topically 2 (two) times daily. For skin yeast infection. 10/17/15   Elliot Cousin, MD  pantoprazole (PROTONIX) 40 MG tablet Take 1 tablet (40 mg total) by mouth 2 (two) times daily before a meal. For your stomach ulcer. 10/17/15   Elliot Cousin, MD  prednisoLONE (ORAPRED) 15 MG/5ML solution Take 10 mLs (30 mg total) by mouth daily before breakfast. Medication for your liver. 10/17/15   Elliot Cousin, MD  sucralfate (CARAFATE) 1 GM/10ML suspension Take 10 mLs (1 g total) by mouth 4 (four) times daily -  with meals and at bedtime. For your stomach ulcer. 10/17/15   Elliot Cousin, MD  thiamine 100 MG tablet Take 1 tablet (100 mg total) by mouth daily. 10/17/15   Elliot Cousin, MD   Physical Exam: Filed Vitals:   10/26/15 1300 10/26/15 1330 10/26/15 1405 10/26/15 1430  BP: 117/81  Pulse: 83 82 86 111  Temp:      TempSrc:      Resp: SpO2: 94% 91% 97% 93%    Wt Readings from Last 3 Encounters:  10/17/15 80.4 kg (177 lb 4 oz)  09/18/15 78.019 kg (172 lb)  09/01/15 81.647 kg (180 lb)    General:  Patient is lethargic able to protect her airways she has a gag reflex. Jaundice Eyes: PERRL, normal lids, irises & conjunctiva ENT: grossly normal hearing, lips & tongue Neck: no LAD, masses or thyromegaly Cardiovascular: RRR, no m/r/g. No LE edema. Telemetry: SR, no arrhythmias  Respiratory: CTA bilaterally, no w/r/r. Normal respiratory effort. Abdomen: soft, ntnd Skin: no rash or induration seen on limited exam Musculoskeletal: grossly normal tone BUE/BLE Psychiatric: grossly normal mood and affect, speech fluent and appropriate Neurologic: Only responsive to painful stimuli.           Labs on Admission:  Basic Metabolic  Panel:  Recent Labs Lab 10/24/15 1211 10/26/15 1006  NA 141 141  K 3.9 4.0  CL 100 104  CO2 22 25  GLUCOSE 93 115*  BUN 5* 7  CREATININE 1.20* 1.02*  CALCIUM 8.3* 8.9   Liver Function Tests:  Recent Labs Lab 10/23/15 1207 10/26/15 1006  AST 114* 98*  ALT 61* 77*  ALKPHOS 129* 147*  BILITOT 21.8* 26.5*  PROT 5.7* 7.0  ALBUMIN 3.1* 3.0*    Recent Labs Lab 10/26/15 1006  LIPASE 22    Recent Labs Lab 10/26/15 1013  AMMONIA 100*   CBC:  Recent Labs Lab 10/26/15 1006  WBC 21.0*  NEUTROABS 17.5*  HGB 13.3  HCT 37.8  MCV 90.6  PLT 331   Cardiac Enzymes: No results for input(s): CKTOTAL, CKMB, CKMBINDEX, TROPONINI in the last 168 hours.  BNP (last 3 results)  Recent Labs  09/18/15 1454  BNP 126.0*    ProBNP (last 3 results) No results for input(s): PROBNP  in the last 8760 hours.  CBG:  Recent Labs Lab 10/26/15 0919  GLUCAP 102*    Radiological Exams on Admission: Ct Head Wo Contrast  10/26/2015  CLINICAL DATA:  Altered mental status EXAM: CT HEAD WITHOUT CONTRAST TECHNIQUE: Contiguous axial images were obtained from the base of the skull through the vertex without intravenous contrast. COMPARISON:  CT head 03/10/2013 FINDINGS: Mild atrophy given the patient's age.  Negative for hydrocephalus Negative for acute infarct. Negative for intracranial hemorrhage. No mass or edema Left parietal scalp soft tissue thickening has improved since the prior study most likely scarring related to prior infection or trauma. Calvarium intact. IMPRESSION: Mild atrophy.  No acute intracranial abnormality. Electronically Signed   By: Marlan Palauharles  Clark M.D.   On: 10/26/2015 11:51   Dg Chest Portable 1 View  10/26/2015  CLINICAL DATA:  Altered mental status. EXAM: PORTABLE CHEST 1 VIEW COMPARISON:  09/28/2015 FINDINGS: Hypoventilation with decreased lung volume. Elevation right hemidiaphragm again noted. Right lower lobe airspace disease has progressed and may represent  pneumonia or atelectasis. Small right pleural effusion. Mild left lower lobe atelectasis/ infiltrate.  Hiatal hernia. IMPRESSION: Hypoventilation with decreased lung volume Right lower lobe infiltrate suspicious for pneumonia. Small right pleural effusion. Mild left lower lobe atelectasis/ infiltrate also present Hiatal hernia. Electronically Signed   By: Marlan Palauharles  Clark M.D.   On: 10/26/2015 09:50    EKG: Independently reviewed. *Sinus tachycardia  Assessment/Plan Sepsis (HCC)/  Leukocytosis: - Unclear source of sepsis, she has been aggressively fluid resuscitated in the ED, and her tachycardia has resolved. - I agree with empiric antibiotics and will continue vancomycin and Zosyn, blood cultures and urine cultures have been sent. - He has a chest x-ray that shows a possible infiltrate and UA shows too many to count white blood cells positive for nitrates and bacteria. - She was released recently in the hospital so we'll have to treat for healthcare associated pneumonia - Leukocytosis is likely due to infectious etiology. She was started empirically on antibiotics.  Alcohol abuse Monitor with CIWA, but due to her sedation will try to avoid benzodiazepines.  Hepatic encephalopathy [facility](HCC): A CT scan of the head was done that showed no acute bleedings, but did show significant atrophy. We'll place an NG tube and start her on lactulose through the NG tube and lactulose enemas.  Elevated bilirubin/cirrhosis: Likely due to her cirrhosis, check a be T and INR are liver enzymes including her bilirubin remain high and no change from previous. We'll have to consult palliative care to assess end-of-life on this patient.  Thrombocytopenia (HCC): Probably mildly elevated due to significant dehydration, will rehydrate and recheck in the morning.    Code Status: full DVT Prophylaxis:heparin Family Communication: none Disposition Plan: inpatient  Time spent: 75 min  Marinda ElkFELIZ ORTIZ, Megham Dwyer [facility] Pager [phone]

## 2015-10-26 NOTE — ED Notes (Signed)
ASSUMED CARE OF PT. PT NONVERBAL, BUT LOCALIZES TO PAIN. PT IN NO APPARENT DISTRESS. AWAITING FURTHER ORDERS.

## 2015-10-26 NOTE — ED Notes (Signed)
Jennifer [name]' [phone]. PLEASE CALL IF ANY UPDATES OR EMERGENCIES.

## 2015-10-27 ENCOUNTER — Inpatient Hospital Stay (HOSPITAL_COMMUNITY): Payer: Medicaid Other

## 2015-10-27 DIAGNOSIS — J189 Pneumonia, unspecified organism: Secondary | ICD-10-CM | POA: Insufficient documentation

## 2015-10-27 DIAGNOSIS — K7469 Other cirrhosis of liver: Secondary | ICD-10-CM

## 2015-10-27 DIAGNOSIS — Z515 Encounter for palliative care: Secondary | ICD-10-CM

## 2015-10-27 DIAGNOSIS — Z7189 Other specified counseling: Secondary | ICD-10-CM

## 2015-10-27 LAB — CBC WITH DIFFERENTIAL/PLATELET
BASOS PCT: 0 %
Basophils Absolute: 0 10*3/uL (ref 0.0–0.1)
EOS ABS: 0 10*3/uL (ref 0.0–0.7)
Eosinophils Relative: 0 %
HCT: 31 % — ABNORMAL LOW (ref 36.0–46.0)
Hemoglobin: 10.8 g/dL — ABNORMAL LOW (ref 12.0–15.0)
LYMPHS PCT: 8 %
Lymphs Abs: 1.4 10*3/uL (ref 0.7–4.0)
MCH: 31.4 pg (ref 26.0–34.0)
MCHC: 34.8 g/dL (ref 30.0–36.0)
MCV: 90.1 fL (ref 78.0–100.0)
Monocytes Absolute: 1.1 10*3/uL — ABNORMAL HIGH (ref 0.1–1.0)
Monocytes Relative: 7 %
NEUTROS ABS: 14.1 10*3/uL — AB (ref 1.7–7.7)
NEUTROS PCT: 85 %
Platelets: 247 10*3/uL (ref 150–400)
RBC: 3.44 MIL/uL — ABNORMAL LOW (ref 3.87–5.11)
RDW: 21 % — AB (ref 11.5–15.5)
WBC: 16.7 10*3/uL — ABNORMAL HIGH (ref 4.0–10.5)

## 2015-10-27 LAB — COMPREHENSIVE METABOLIC PANEL
ALT: 50 U/L (ref 14–54)
AST: 71 U/L — AB (ref 15–41)
Albumin: 2.1 g/dL — ABNORMAL LOW (ref 3.5–5.0)
Alkaline Phosphatase: 102 U/L (ref 38–126)
Anion gap: 14 (ref 5–15)
BILIRUBIN TOTAL: 18.9 mg/dL — AB (ref 0.3–1.2)
BUN: 6 mg/dL (ref 6–20)
CO2: 21 mmol/L — ABNORMAL LOW (ref 22–32)
Calcium: 7.6 mg/dL — ABNORMAL LOW (ref 8.9–10.3)
Chloride: 108 mmol/L (ref 101–111)
Creatinine, Ser: 0.66 mg/dL (ref 0.44–1.00)
GFR calc Af Amer: >60 mL/min (ref >60)
Glucose, Bld: 110 mg/dL — ABNORMAL HIGH (ref 65–99)
POTASSIUM: 3.8 mmol/L (ref 3.5–5.1)
Sodium: 143 mmol/L (ref 135–145)
TOTAL PROTEIN: 5.2 g/dL — AB (ref 6.5–8.1)

## 2015-10-27 LAB — MRSA PCR SCREENING: MRSA BY PCR: POSITIVE — AB

## 2015-10-27 LAB — PROTIME-INR
INR: 1.84 — AB (ref 0.00–1.49)
PROTHROMBIN TIME: 21.2 s — AB (ref 11.6–15.2)

## 2015-10-27 LAB — LACTIC ACID, PLASMA
LACTIC ACID, VENOUS: 1.7 mmol/L (ref 0.5–2.0)
Lactic Acid, Venous: 1.9 mmol/L (ref 0.5–2.0)

## 2015-10-27 LAB — PHOSPHORUS: Phosphorus: 3.2 mg/dL (ref 2.5–4.6)

## 2015-10-27 LAB — PROCALCITONIN: PROCALCITONIN: 2.42 ng/mL

## 2015-10-27 LAB — APTT: aPTT: 43 seconds — ABNORMAL HIGH (ref 24–37)

## 2015-10-27 MED ORDER — PANTOPRAZOLE SODIUM 40 MG IV SOLR
40.0000 mg | Freq: Two times a day (BID) | INTRAVENOUS | Status: DC
  Administered 2015-10-27 – 2015-10-29 (×6): 40 mg via INTRAVENOUS
  Filled 2015-10-27 (×6): qty 40

## 2015-10-27 MED ORDER — MUPIROCIN 2 % EX OINT
1.0000 "application " | TOPICAL_OINTMENT | Freq: Two times a day (BID) | CUTANEOUS | Status: DC
  Administered 2015-10-27 – 2015-10-29 (×6): 1 via NASAL
  Filled 2015-10-27: qty 22

## 2015-10-27 MED ORDER — SODIUM CHLORIDE 0.9 % IV SOLN
INTRAVENOUS | Status: DC
Start: 2015-10-27 — End: 2015-10-27
  Administered 2015-10-27: 16:00:00 via INTRAVENOUS

## 2015-10-27 MED ORDER — VITAMINS A & D EX OINT
TOPICAL_OINTMENT | CUTANEOUS | Status: AC
  Filled 2015-10-27: qty 5

## 2015-10-27 MED ORDER — CETYLPYRIDINIUM CHLORIDE 0.05 % MT LIQD
7.0000 mL | Freq: Two times a day (BID) | OROMUCOSAL | Status: DC
  Administered 2015-10-27 – 2015-10-31 (×9): 7 mL via OROMUCOSAL

## 2015-10-27 MED ORDER — LACTULOSE ENEMA
300.0000 mL | Freq: Once | ORAL | Status: AC
  Administered 2015-10-27: 300 mL via RECTAL
  Filled 2015-10-27: qty 300

## 2015-10-27 MED ORDER — CHLORHEXIDINE GLUCONATE CLOTH 2 % EX PADS
6.0000 | MEDICATED_PAD | Freq: Every morning | CUTANEOUS | Status: DC
  Administered 2015-10-27 – 2015-10-29 (×3): 6 via TOPICAL

## 2015-10-27 MED ORDER — SODIUM CHLORIDE 0.9 % IV BOLUS (SEPSIS)
500.0000 mL | Freq: Once | INTRAVENOUS | Status: AC
  Administered 2015-10-27: 500 mL via INTRAVENOUS

## 2015-10-27 MED ORDER — DEXTROSE-NACL 5-0.9 % IV SOLN
INTRAVENOUS | Status: DC
  Administered 2015-10-28: 03:00:00 via INTRAVENOUS

## 2015-10-27 MED ORDER — LACTULOSE ENEMA
300.0000 mL | Freq: Three times a day (TID) | ORAL | Status: DC
  Filled 2015-10-27: qty 300

## 2015-10-27 MED ORDER — CHLORHEXIDINE GLUCONATE 0.12 % MT SOLN
15.0000 mL | Freq: Two times a day (BID) | OROMUCOSAL | Status: DC
  Administered 2015-10-27 – 2015-11-01 (×11): 15 mL via OROMUCOSAL
  Filled 2015-10-27 (×11): qty 15

## 2015-10-27 MED ORDER — CHLORHEXIDINE GLUCONATE CLOTH 2 % EX PADS: 6.0000 | MEDICATED_PAD | Freq: Every day | CUTANEOUS | Status: DC

## 2015-10-27 MED ORDER — LACTULOSE ENEMA
300.0000 mL | Freq: Three times a day (TID) | RECTAL | Status: DC
Start: 2015-10-27 — End: 2015-10-28
  Administered 2015-10-27 – 2015-10-28 (×2): 300 mL via RECTAL
  Filled 2015-10-27 (×4): qty 300

## 2015-10-27 NOTE — Progress Notes (Signed)
Family came in from [place]. They are adament about transplant options, and upset that they missed rounding. They requested MD come by to update them, MD paged.

## 2015-10-27 NOTE — Progress Notes (Signed)
Consultation Note Date: 10/27/2015   Patient Name: Jennifer [name]  DOB: Aug 28, 1970  MRN: [id number]  Age / Sex: 45 y.o., female  PCP: [facility] Referring Physician: Alba Cory, MD  Reason for Consultation: Establishing goals of care  Clinical Assessment/Narrative: 45 y.o. female past medical history of tobacco abuse, alcohol abuse, history of decompensated liver cirrhosis, pancreatitis chronic lower extremity edema recently discharged from [facility] for lower extremity cellulitis admitted with altered mental status, per report patient came increasingly lethargic for 5 days prior to admit, and no significant change in her responsiveness since day of admission. She is unable to participate in conversation.  Contacts/Participants in Discussion: Patient's parents, fianc Gaynelle Adu, patient's daughter, and a friend. Patient is also noted to have 2 other children who are not present today.  Her family reports that the most important thing to the patient is her family. She has always had a love for racing, particularly during track racing.  In the past she has worked as a Chartered loss adjuster.  Her family reports she has struggled with depression for a long time and this is what led to her increase in alcohol use. They state that she was 45 years old when she started drinking. Her fianc reports that she was fired from her job at the end of last year and "stayed drunk for 3 months straight."  They report that physicians have been doing a good job explaining things to them, and they understand the severity of her condition.  I expressed to them my concern about her current clinical condition.  I expressed concern that, if her condition were to continue to worsen, she would likely never be well enough to enjoy the things they report being most important to her if increasingly aggressive care, such as  intubation, CPR, or pressors were initiated.  SUMMARY OF RECOMMENDATIONS - She has multiple family members present today. They seem to understand the extent and seriousness of her condition. - She does not have any advanced directives naming surrogate decision maker. She is reported to have 3 children. One of them is present today. Based upon the fact that she has not named another surrogate, decision making would fall to her daughter children. Her other children are trying to make it in town tomorrow. - We discussed CODE STATUS and family is in agreement that she would like to pursue all aggressive interventions and remain a full code at this time. - I left copies of hard choices for loving people for the family to review - We have set up a follow-up meeting for tomorrow at 1 PM when her other children can be present in order to continue conversation about long and short-term goals of care.  If they are unable to make it into town, we will use conference calling in order to make sure they are part of conversation.  Code Status/Advance Care Planning: Full code    Code Status Orders        Start     Ordered   10/26/15 2128  Full code   Continuous     10/26/15 2128    Code Status History    Date Active Date Inactive Code Status Order ID Comments User Context   09/28/2015  9:30 PM 10/17/2015  8:33 PM Full Code [id number]  Gery Pray, MD Inpatient   09/01/2015  5:54 PM 09/05/2015  6:55 PM Full Code [id number]  Levie Heritage, DO Inpatient   10/15/2014  1:15 AM 10/18/2014  6:16  PM Full Code [id number]  Houston Siren, MD Inpatient   01/23/2014  3:37 AM 01/25/2014  9:45 PM Full Code [id number]  Meredeth Ide, MD Inpatient      Other Directives:None  Symptom Management:   Unresponsive.  No signs of distress  Palliative Prophylaxis:   Aspiration, Bowel Regimen and Frequent Pain Assessment  Psycho-social/Spiritual:  Support System: Fair   Prognosis: Unable to determine, however she remains critically  ill and I expressed to family my concern that this may be a terminal admission  Discharge Planning: To be determined   Chief Complaint/ Primary Diagnoses: Present on Admission:  . Alcohol abuse . Elevated bilirubin . Sepsis (HCC) . Thrombocytopenia (HCC)  I have reviewed the medical record, interviewed the patient and family, and examined the patient. The following aspects are pertinent.  Past Medical History  Diagnosis Date  . Gastric bypass status for obesity   . Pancreatitis   . Depression   . Alcohol abuse   . Shortness of breath dyspnea   . GERD (gastroesophageal reflux disease)   . Gastric erosion 10/12/2015  . Acute blood loss anemia 09/30/2015  . 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 Alcoholic hepatitis without ascites    Social History   Social History  . Marital Status: Divorced    Spouse Name: N/A  . Number of Children: N/A  . Years of Education: N/A   Social History Main Topics  . Smoking status: Current Every Day Smoker -- 0.50 packs/day    Types: Cigarettes  . Smokeless tobacco: None  . Alcohol Use: Yes     Comment: daily,  vodka  1 pint per day, last time drank alcohol  march 20th 2017  . Drug Use: No  . Sexual Activity: Yes    Birth Control/ Protection: None   Other Topics Concern  . 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   History reviewed. No pertinent family history. Scheduled Meds: . antiseptic oral rinse  7 mL Mouth Rinse q12n4p  . chlorhexidine  15 mL Mouth Rinse BID  . Chlorhexidine Gluconate Cloth  6 each Topical q morning - 10a  . lactulose  300 mL Rectal TID  . mupirocin ointment  1 application Nasal BID  . pantoprazole (PROTONIX) IV  40 mg Intravenous Q12H  . piperacillin-tazobactam (ZOSYN)  IV  3.375 g Intravenous Q8H  . sodium chloride flush  3 mL Intravenous Q12H  . vancomycin  1,000 mg Intravenous Q12H  . vitamin A & D       Continuous Infusions: . dextrose 5 %  and 0.9% NaCl 125 mL/hr at 10/27/15 1739   PRN Meds:.ondansetron **OR** ondansetron (ZOFRAN) IV Medications Prior to Admission:  Prior to Admission medications   Medication Sig Start Date End Date Taking? Authorizing Provider  camphor-[facility]) lotion Apply topically as needed for itching. 10/17/15  Yes Elliot Cousin, MD  fluconazole (DIFLUCAN) 100 MG tablet Take 1 tablet (100 mg total) by mouth daily. For yeast skin infection. 10/17/15  Yes Elliot Cousin, MD  hydrOXYzine (ATARAX/VISTARIL) 25 MG tablet Take 1 tablet (25 mg total) by mouth 3 (three) times daily as needed for itching. 10/17/15  Yes Elliot Cousin, MD  lactulose (CHRONULAC) 10 GM/15ML solution Take 15 mLs (10 g total) by mouth daily. Medication to keep your ammonia level down. It is also a laxative. 10/17/15  Yes Elliot Cousin, MD  midodrine (PROAMATINE) 5 MG tablet Take 1 tablet (5 mg  total) by mouth 2 (two) times daily with a meal. Medication to keep your blood pressure from dropping too low. 10/17/15  Yes Elliot Cousin, MD  Multiple Vitamin (MULTIVITAMIN WITH MINERALS) TABS tablet Take 1 tablet by mouth daily. 10/18/14  Yes Lesle Chris Black, NP  pantoprazole (PROTONIX) 40 MG tablet Take 1 tablet (40 mg total) by mouth 2 (two) times daily before a meal. For your stomach ulcer. 10/17/15  Yes Elliot Cousin, MD  prednisoLONE (ORAPRED) 15 MG/5ML solution Take 10 mLs (30 mg total) by mouth daily before breakfast. Medication for your liver. 10/17/15  Yes Elliot Cousin, MD  thiamine 100 MG tablet Take 1 tablet (100 mg total) by mouth daily. 10/17/15  Yes Elliot Cousin, MD  nystatin ointment (MYCOSTATIN) Apply topically 2 (two) times daily. For skin yeast infection. Patient not taking: Reported on 10/26/2015 10/17/15   Elliot Cousin, MD  sucralfate (CARAFATE) 1 GM/10ML suspension Take 10 mLs (1 g total) by mouth 4 (four) times daily -  with meals and at bedtime. For your stomach ulcer. Patient not taking: Reported on 10/26/2015 10/17/15   Elliot Cousin, MD    Allergies  Allergen Reactions  . Aspirin Other (See Comments)    Cant take due to gastric bypass     Review of Systems  Unable to obtain  Physical Exam   General: Lethargic, not responsive to verbal or tactile stimulation  Cardiovascular: S 1, S 2 RRR  Respiratory: Crackles bilaterally.   Abdomen: Bs present, soft mild distended  Musculoskeletal: bilateral edema  Vital Signs: BP 127/57 mmHg  Pulse 82  Temp(Src) 97.5 F (36.4 C) (Axillary)  Resp 22  Ht 5\' 8"  (1.727 m)  Wt 84.3 kg (185 lb 13.6 oz)  BMI 28.26 kg/m2  SpO2 94%  SpO2: SpO2: 94 % O2 Device:SpO2: 94 % O2 Flow Rate: .O2 Flow Rate (L/min): 3 L/min  IO: Intake/output summary:  Intake/Output Summary (Last 24 hours) at 10/27/15 1841 Last data filed at 10/27/15 1600  Gross per 24 hour  Intake 2213.33 ml  Output    670 ml  Net 1543.33 ml    LBM: Last BM Date: 10/27/15 Baseline Weight: Weight: 84.3 kg (185 lb 13.6 oz) Most recent weight: Weight: 84.3 kg (185 lb 13.6 oz)      Palliative Assessment/Data:    Additional Data Reviewed:  CBC:    Component Value Date/Time   WBC 16.7* 10/26/2015 2357   HGB 10.8* 10/26/2015 2357   HCT 31.0* 10/26/2015 2357   PLT 247 10/26/2015 2357   MCV 90.1 10/26/2015 2357   NEUTROABS 14.1* 10/26/2015 2357   LYMPHSABS 1.4 10/26/2015 2357   MONOABS 1.1* 10/26/2015 2357   EOSABS 0.0 10/26/2015 2357   BASOSABS 0.0 10/26/2015 2357   Comprehensive Metabolic Panel:    Component Value Date/Time   NA 143 10/27/2015 0001   K 3.8 10/27/2015 0001   CL 108 10/27/2015 0001   CO2 21* 10/27/2015 0001   BUN 6 10/27/2015 0001   CREATININE 0.66 10/27/2015 0001   CREATININE 1.20* 10/24/2015 1211   GLUCOSE 110* 10/27/2015 0001   CALCIUM 7.6* 10/27/2015 0001   AST 71* 10/27/2015 0001   ALT 50 10/27/2015 0001   ALKPHOS 102 10/27/2015 0001   BILITOT 18.9* 10/27/2015 0001   PROT 5.2* 10/27/2015 0001   ALBUMIN 2.1* 10/27/2015 0001     Time In: 1615 Time Out: 1735 Time  Total: 80 Greater than 50%  of this time was spent counseling and coordinating care related to the above assessment  and plan.  Signed by: Romie MinusGene Lum Stillinger, MD  Romie MinusGene Artemisia Auvil, MD  10/27/2015, 6:41 PM  Please contact Palliative Medicine Team phone at [phone] for questions and concerns.

## 2015-10-27 NOTE — Progress Notes (Signed)
Family had a meeting with both medical and palliative MDs, family schedueled another meeting with palliative tomorow afternoon (10-28-15) to discuss goals of care, [place]. Family reasonable and realistic about patients condition. All are supportive and cooperative.

## 2015-10-27 NOTE — Progress Notes (Signed)
[facility] PROGRESS NOTE  Jennifer Seenarry L Revak RUE:[id number] DOB: 04/02/1971 DOA: 10/26/2015 [facility]CP: [facility]  Assessment/Plan: Jennifer [name] is a 45 y.o. female past medical history of tobacco abuse, alcohol, history of decompensated liver cirrhosis ,pancreatitis chronic lower extremity edema recently discharged from [facility] for lower extremity cellulitis that is brought in for altered mental status, per report patient has been lethargic for last 5 days, and no responsive since day of admission.  Sepsis (HCC)/ Leukocytosis: PNA. UTI ?  He has a chest x-ray that shows a possible infiltrate and UA shows too many to count white blood cells positive for nitrates and bacteria. Follow cultures.  Continue with IV antibiotics.   Acute encephalopathy; hepatic encephalopathy;  Continue with lactulose per rectum.  NG tube was not able to be place.  Repeat ammonia level in am.  Liver failure, cirrhosis. Acute decompensation.  bilirubin trending down for 26 to 18 US; Increased echotexture throughout the liver compatible with fatty infiltration. Small amount of perihepatic ascites. Gallbladder distention with gallbladder sludge. No wall thickening or sonographic Murphy sign. GI consulted.  Poor prognosis, Palliative care consulted for goals of care.  Lactulose. IV antibiotics.  IV protonix.   Alcohol abuse Monitor with CIWA, but due to her sedation will try to avoid benzodiazepines.    Code Status: Full Code.  Family Communication: mother at bedside. Mother was aware that patient had some problems with liver. I have consulted palliative care for goals of care.   Disposition Plan: Remain in the step down.    Consultants:  GI  Palliative care   Procedures:  US  Antibiotics:  Vancomycin   Zosyn   HPI/Subjective: Patient is lethargic, no responsive. She is not following command.   Objective: Filed Vitals:   10/27/15 0400 10/27/15 0500  BP: 126/75  106/63  Pulse: 108 76  Temp: 97.5 F (36.4 C)   Resp: 16 25    Intake/Output Summary (Last 24 hours) at 10/27/15 0744 Last data filed at 10/27/15 0500  Gross per 24 hour  Intake 1213.33 ml  Output    470 ml  Net 743.33 ml   Filed Weights   10/26/15 2300  Weight: 84.3 kg (185 lb 13.6 oz)    Exam:   General:  Lethargic, no responsive  Cardiovascular: S 1, S 2 RRR  Respiratory: Crackle bilateral.   Abdomen: Bs present, soft mild distended  Musculoskeletal: bilateral edema  Data Reviewed: Basic Metabolic Panel:  Recent Labs Lab 10/24/15 1211 10/26/15 1006 10/27/15 0001  NA 141 141 143  K 3.9 4.0 3.8  CL 100 104 108  CO2 22 25 21*  GLUCOSE 93 115* 110*  BUN 5* 7 6  CREATININE 1.20* 1.02* 0.66  CALCIUM 8.3* 8.9 7.6*  PHOS  --   --  3.2   Liver Function Tests:  Recent Labs Lab 10/23/15 1207 10/26/15 1006 10/27/15 0001  AST 114* 98* 71*  ALT 61* 77* 50  ALKPHOS 129* 147* 102  BILITOT 21.8* 26.5* 18.9*  PROT 5.7* 7.0 5.2*  ALBUMIN 3.1* 3.0* 2.1*    Recent Labs Lab 10/26/15 1006  LIPASE 22    Recent Labs Lab 10/26/15 1013  AMMONIA 100*   CBC:  Recent Labs Lab 10/26/15 1006 10/26/15 2357  WBC 21.0* 16.7*  NEUTROABS 17.5* 14.1*  HGB 13.3 10.8*  HCT 37.8 31.0*  MCV 90.6 90.1  PLT 331 247   Cardiac Enzymes: No results for input(s): CKTOTAL, CKMB, CKMBINDEX, TROPONINI in the last 168  hours. BNP (last 3 results)  Recent Labs  09/18/15 1454  BNP 126.0*    ProBNP (last 3 results) No results for input(s): PROBNP in the last 8760 hours.  CBG:  Recent Labs Lab 10/26/15 0919 10/26/15 1548  GLUCAP 102* 92    Recent Results (from the past 240 hour(s))  MRSA PCR Screening     Status: Abnormal   Collection Time: 10/27/15  1:00 AM  Result Value Ref Range Status   MRSA by PCR POSITIVE (A) NEGATIVE Final    Comment:        The GeneXpert MRSA Assay (FDA approved for NASAL specimens only), is one component of a comprehensive  MRSA colonization surveillance program. It is not intended to diagnose MRSA infection nor to guide or monitor treatment for MRSA infections. RESULT CALLED TO, READ BACK BY AND VERIFIED WITH: AWebb Silversmith RN AT [phone] ON 04.14.17 BY SHUEA      Studies: Dg Abd 1 View  10/27/2015  CLINICAL DATA:  NG tube placement. EXAM: ABDOMEN - 1 VIEW COMPARISON:  CT 09/01/2015. FINDINGS: NG tube noted. The tube appears to be seen in the hiatal hernia. Repositioning should be considered. Surgical clips sutures in the upper abdomen. No bowel distention. Bibasilar atelectasis and/or infiltrates. Right pleural effusion. IMPRESSION: 1. NG tube noted. The tip appears to be kinked and the patient's hiatal hernia. Repositioning should be considered. Surgical clips sutures in the upper abdomen. 2. Bibasilar atelectasis and/or infiltrates. Small right pleural effusion . Electronically Signed   By: Maisie Fus  Register   On: 10/27/2015 07:12   Ct Head Wo Contrast  10/26/2015  CLINICAL DATA:  Altered mental status EXAM: CT HEAD WITHOUT CONTRAST TECHNIQUE: Contiguous axial images were obtained from the base of the skull through the vertex without intravenous contrast. COMPARISON:  CT head 03/10/2013 FINDINGS: Mild atrophy given the patient's age.  Negative for hydrocephalus Negative for acute infarct. Negative for intracranial hemorrhage. No mass or edema Left parietal scalp soft tissue thickening has improved since the prior study most likely scarring related to prior infection or trauma. Calvarium intact. IMPRESSION: Mild atrophy.  No acute intracranial abnormality. Electronically Signed   By: Marlan Palau M.D.   On: 10/26/2015 11:51   Dg Chest Portable 1 View  10/26/2015  CLINICAL DATA:  Altered mental status. EXAM: PORTABLE CHEST 1 VIEW COMPARISON:  09/28/2015 FINDINGS: Hypoventilation with decreased lung volume. Elevation right hemidiaphragm again noted. Right lower lobe airspace disease has progressed and may represent  pneumonia or atelectasis. Small right pleural effusion. Mild left lower lobe atelectasis/ infiltrate.  Hiatal hernia. IMPRESSION: Hypoventilation with decreased lung volume Right lower lobe infiltrate suspicious for pneumonia. Small right pleural effusion. Mild left lower lobe atelectasis/ infiltrate also present Hiatal hernia. Electronically Signed   By: Marlan Palau M.D.   On: 10/26/2015 09:50    Scheduled Meds: . antiseptic oral rinse  7 mL Mouth Rinse q12n4p  . chlorhexidine  15 mL Mouth Rinse BID  . Chlorhexidine Gluconate Cloth  6 each Topical q morning - 10a  . heparin  5,000 Units Subcutaneous 3 times per day  . lactulose  30 g Oral TID  . lactulose  300 mL Rectal BID  . mupirocin ointment  1 application Nasal BID  . piperacillin-tazobactam (ZOSYN)  IV  3.375 g Intravenous Q8H  . sodium chloride flush  3 mL Intravenous Q12H  . vancomycin  1,000 mg Intravenous Q12H   Continuous Infusions: . sodium chloride 100 mL/hr at 10/26/15 1922    Active  Problems:   Alcohol abuse   Elevated bilirubin   Thrombocytopenia (HCC)   Hepatic encephalopathy (HCC)   Leukocytosis   Sepsis (HCC)    Time spent: 35 minutes.     Hartley Barefoot A  [facility] Pager [phone]. If 7PM-7AM, please contact night-coverage at [email or web address], password [facility] 10/27/2015, 7:44 AM  LOS: 1 day

## 2015-10-27 NOTE — Progress Notes (Signed)
Restraints were never applied, discontinued order, patient not violent or interfereing with care MD notified.

## 2015-10-27 NOTE — Care Management Note (Signed)
Case Management Note  Patient Details  Name: Albesa Seenarry L Robart MRN: [id number] Date of Birth: July 14, 1971  Subjective/Objective:               abdpain and pancreatitis by history     Action/Plan:Date:  October 27, 2015 Chart reviewed for concurrent status and case management needs. Will continue to follow patient for changes and needs: Marcelle Smilinghonda Rolen Conger, BSN, RN, [place]   [phone]   Expected Discharge Date:                  Expected Discharge Plan:  Home/Self Care  In-House Referral:  NA  Discharge planning Services  CM Consult  Post Acute Care Choice:  NA Choice offered to:  NA  DME Arranged:    DME Agency:     HH Arranged:    HH Agency:     Status of Service:  Completed, signed off  Medicare Important Message Given:    Date Medicare IM Given:    Medicare IM give by:    Date Additional Medicare IM Given:    Additional Medicare Important Message give by:     If discussed at Long Length of Stay Meetings, dates discussed:    Additional Comments:  Golda AcreDavis, Graeson Nouri Lynn, RN 10/27/2015, 8:40 AM

## 2015-10-28 ENCOUNTER — Inpatient Hospital Stay (HOSPITAL_COMMUNITY): Payer: Medicaid Other

## 2015-10-28 ENCOUNTER — Encounter (HOSPITAL_COMMUNITY): Payer: Self-pay | Admitting: Gastroenterology

## 2015-10-28 LAB — URINE CULTURE: Culture: >=100000 — AB

## 2015-10-28 LAB — COMPREHENSIVE METABOLIC PANEL
ALBUMIN: 2.1 g/dL — AB (ref 3.5–5.0)
ALK PHOS: 102 U/L (ref 38–126)
ALT: 58 U/L — AB (ref 14–54)
AST: 88 U/L — AB (ref 15–41)
Anion gap: 11 (ref 5–15)
BILIRUBIN TOTAL: 20.3 mg/dL — AB (ref 0.3–1.2)
BUN: 6 mg/dL (ref 6–20)
CALCIUM: 7.8 mg/dL — AB (ref 8.9–10.3)
CHLORIDE: 115 mmol/L — AB (ref 101–111)
CO2: 22 mmol/L (ref 22–32)
CREATININE: 0.81 mg/dL (ref 0.44–1.00)
GFR calc Af Amer: >60 mL/min (ref >60)
GFR calc non Af Amer: >60 mL/min (ref >60)
Glucose, Bld: 155 mg/dL — ABNORMAL HIGH (ref 65–99)
Potassium: 3.5 mmol/L (ref 3.5–5.1)
Sodium: 148 mmol/L — ABNORMAL HIGH (ref 135–145)
Total Protein: 5.3 g/dL — ABNORMAL LOW (ref 6.5–8.1)

## 2015-10-28 LAB — PROTIME-INR
INR: 1.93 — ABNORMAL HIGH (ref 0.00–1.49)
Prothrombin Time: 22 seconds — ABNORMAL HIGH (ref 11.6–15.2)

## 2015-10-28 LAB — PREGNANCY, URINE: PREG TEST UR: NEGATIVE

## 2015-10-28 LAB — AMMONIA: AMMONIA: 163 umol/L — AB (ref 9–35)

## 2015-10-28 MED ORDER — SODIUM CHLORIDE 0.9 % IV BOLUS (SEPSIS)
500.0000 mL | Freq: Once | INTRAVENOUS | Status: AC
  Administered 2015-10-28: 500 mL via INTRAVENOUS

## 2015-10-28 MED ORDER — METHYLPREDNISOLONE SODIUM SUCC 40 MG IJ SOLR
40.0000 mg | Freq: Two times a day (BID) | INTRAMUSCULAR | Status: DC
  Administered 2015-10-28 – 2015-10-29 (×4): 40 mg via INTRAVENOUS
  Filled 2015-10-28 (×4): qty 1

## 2015-10-28 MED ORDER — DEXTROSE 5 % IV SOLN
INTRAVENOUS | Status: DC
  Administered 2015-10-28 – 2015-10-30 (×6): via INTRAVENOUS

## 2015-10-28 MED ORDER — LACTULOSE 10 GM/15ML PO SOLN
30.0000 g | Freq: Three times a day (TID) | ORAL | Status: DC
  Administered 2015-10-28 (×2): 30 g via ORAL
  Filled 2015-10-28 (×2): qty 45

## 2015-10-28 NOTE — Consult Note (Signed)
Reason for Consult: Liver failure encephalopathy Referring Physician: Hospital team  Jennifer [name] is an 45 y.o. female.  HPI: Patient seen and examined and her hospital computer chart was reviewed particularly her Forestine Na recent admission to include her endoscopy a recent CT and ultrasound and being started on steroids on April 2 and I had a long conversation with her parents and she has a long alcohol history and currently she is encephalopathic and no history is obtained from her and I discussed her case with the nurse and they've been unable to place feeding tube and her case was discussed with the hospital team as well  Past Medical History  Diagnosis Date  . Gastric bypass status for obesity   . Pancreatitis   . Depression   . Alcohol abuse   . Shortness of breath dyspnea   . GERD (gastroesophageal reflux disease)   . Gastric erosion 10/12/2015  . Acute blood loss anemia 09/30/2015  . 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 Alcoholic hepatitis without ascites     Past Surgical History  Procedure Laterality Date  . Gastric bypass    . Esophagogastroduodenoscopy (egd) with propofol N/A 10/12/2015    Procedure: ESOPHAGOGASTRODUODENOSCOPY (EGD) WITH PROPOFOL;  Surgeon: Daneil Dolin, MD;  Location: [facility];  Service: Endoscopy;  Laterality: N/A;  . Esophageal banding N/A 10/12/2015    Procedure: ESOPHAGEAL BANDING;  Surgeon: Daneil Dolin, MD;  Location: [facility];  Service: Endoscopy;  Laterality: N/A;    History reviewed. No pertinent family history.  Social History:  reports that she has been smoking Cigarettes.  She has been smoking about 0.50 packs per day. She does not have any smokeless tobacco history on file. She reports that she drinks alcohol. She reports that she does not use illicit drugs.  Allergies:  Allergies  Allergen Reactions  . Aspirin Other (See Comments)    Cant take due to gastric bypass     Medications: I have reviewed the patient's current  medications.  Results for orders placed or performed during the hospital encounter of 10/26/15 (from the past 48 hour(s))  CBC with Differential     Status: Abnormal   Collection Time: 10/26/15 10:06 AM  Result Value Ref Range   WBC 21.0 (H) 4.0 - 10.5 K/uL   RBC 4.17 3.87 - 5.11 MIL/uL   Hemoglobin 13.3 12.0 - 15.0 g/dL   HCT 37.8 36.0 - 46.0 %   MCV 90.6 78.0 - 100.0 fL   MCH 31.9 26.0 - 34.0 pg   MCHC 35.2 30.0 - 36.0 g/dL   RDW 20.7 (H) 11.5 - 15.5 %   Platelets 331 150 - 400 K/uL   Neutrophils Relative % 83 %   Neutro Abs 17.5 (H) 1.7 - 7.7 K/uL   Lymphocytes Relative 10 %   Lymphs Abs 2.1 0.7 - 4.0 K/uL   Monocytes Relative 7 %   Monocytes Absolute 1.4 (H) 0.1 - 1.0 K/uL   Eosinophils Relative 0 %   Eosinophils Absolute 0.0 0.0 - 0.7 K/uL   Basophils Relative 0 %   Basophils Absolute 0.0 0.0 - 0.1 K/uL  Comprehensive metabolic panel     Status: Abnormal   Collection Time: 10/26/15 10:06 AM  Result Value Ref Range   Sodium 141 135 - 145 mmol/L   Potassium 4.0 3.5 - 5.1 mmol/L   Chloride 104 101 - 111 mmol/L   CO2 25 22 - 32 mmol/L   Glucose, Bld 115 (H) 65 - 99 mg/dL  BUN 7 6 - 20 mg/dL   Creatinine, Ser 1.02 (H) 0.44 - 1.00 mg/dL   Calcium 8.9 8.9 - 10.3 mg/dL   Total Protein 7.0 6.5 - 8.1 g/dL   Albumin 3.0 (L) 3.5 - 5.0 g/dL   AST 98 (H) 15 - 41 U/L   ALT 77 (H) 14 - 54 U/L   Alkaline Phosphatase 147 (H) 38 - 126 U/L   Total Bilirubin 26.5 (HH) 0.3 - 1.2 mg/dL    Comment: CRITICAL RESULT CALLED TO, READ BACK BY AND VERIFIED WITH: CAMPBELL,K. RN _0  ON 4.13.17 BY MCCOY,N.    GFR calc non Af Amer >60 >60 mL/min   GFR calc Af Amer >60 >60 mL/min    Comment: (NOTE) The eGFR has been calculated using the CKD EPI equation. This calculation has not been validated in all clinical situations. eGFR's persistently <60 mL/min signify possible Chronic Kidney Disease.    Anion gap 12 5 - 15  Protime-INR     Status: Abnormal   Collection Time: 10/26/15 10:06 AM   Result Value Ref Range   Prothrombin Time 19.7 (H) 11.6 - 15.2 seconds   INR 1.67 (H) 0.00 - 1.49  APTT     Status: Abnormal   Collection Time: 10/26/15 10:06 AM  Result Value Ref Range   aPTT 40 (H) 24 - 37 seconds    Comment:        IF BASELINE aPTT IS ELEVATED, SUGGEST PATIENT RISK ASSESSMENT BE USED TO DETERMINE APPROPRIATE ANTICOAGULANT THERAPY.   Ethanol     Status: None   Collection Time: 10/26/15 10:06 AM  Result Value Ref Range   Alcohol, Ethyl (B) <5 <5 mg/dL    Comment:        LOWEST DETECTABLE LIMIT FOR SERUM ALCOHOL IS 5 mg/dL FOR MEDICAL PURPOSES ONLY   Lipase, blood     Status: None   Collection Time: 10/26/15 10:06 AM  Result Value Ref Range   Lipase 22 11 - 51 U/L  Blood culture (routine x 2)     Status: None (Preliminary result)   Collection Time: 10/26/15 10:09 AM  Result Value Ref Range   Specimen Description BLOOD RIGHT ANTECUBITAL    Special Requests BOTTLES DRAWN AEROBIC AND ANAEROBIC 5CC    Culture  Setup Time      GRAM VARIABLE COCCOBACILLUS ANAEROBIC BOTTLE ONLY CRITICAL RESULT CALLED TO, READ BACK BY AND VERIFIED WITH: Johnnette Litter AT 0832 ON [id number] BY Rhea Bleacher    Culture      TOO YOUNG TO READ Performed at [facility]    Report Status PENDING   Blood culture (routine x 2)     Status: None (Preliminary result)   Collection Time: 10/26/15 10:10 AM  Result Value Ref Range   Specimen Description BLOOD RIGHT ARM    Special Requests BOTTLES DRAWN AEROBIC ONLY 6CC    Culture      NO GROWTH 1 DAY Performed at [facility]    Report Status PENDING   Ammonia     Status: Abnormal   Collection Time: 10/26/15 10:13 AM  Result Value Ref Range   Ammonia 100 (H) 9 - 35 umol/L  I-Stat CG4 Lactic Acid, ED     Status: Abnormal   Collection Time: 10/26/15 10:23 AM  Result Value Ref Range   Lactic Acid, Venous 2.59 (HH) 0.5 - 2.0 mmol/L   Comment NOTIFIED PHYSICIAN   I-Stat CG4 Lactic Acid, ED     Status: Abnormal  Collection  Time: 10/26/15  2:33 PM  Result Value Ref Range   Lactic Acid, Venous 2.13 (HH) 0.5 - 2.0 mmol/L   Comment NOTIFIED PHYSICIAN   CBG monitoring, ED     Status: None   Collection Time: 10/26/15  3:48 PM  Result Value Ref Range   Glucose-Capillary 92 65 - 99 mg/dL  Lactic acid, plasma     Status: None   Collection Time: 10/26/15 11:23 PM  Result Value Ref Range   Lactic Acid, Venous 1.9 0.5 - 2.0 mmol/L  CBC with Differential/Platelet     Status: Abnormal   Collection Time: 10/26/15 11:57 PM  Result Value Ref Range   WBC 16.7 (H) 4.0 - 10.5 K/uL   RBC 3.44 (L) 3.87 - 5.11 MIL/uL   Hemoglobin 10.8 (L) 12.0 - 15.0 g/dL   HCT 31.0 (L) 36.0 - 46.0 %   MCV 90.1 78.0 - 100.0 fL   MCH 31.4 26.0 - 34.0 pg   MCHC 34.8 30.0 - 36.0 g/dL   RDW 21.0 (H) 11.5 - 15.5 %   Platelets 247 150 - 400 K/uL   Neutrophils Relative % 85 %   Neutro Abs 14.1 (H) 1.7 - 7.7 K/uL   Lymphocytes Relative 8 %   Lymphs Abs 1.4 0.7 - 4.0 K/uL   Monocytes Relative 7 %   Monocytes Absolute 1.1 (H) 0.1 - 1.0 K/uL   Eosinophils Relative 0 %   Eosinophils Absolute 0.0 0.0 - 0.7 K/uL   Basophils Relative 0 %   Basophils Absolute 0.0 0.0 - 0.1 K/uL  Comprehensive metabolic panel     Status: Abnormal   Collection Time: 10/27/15 12:01 AM  Result Value Ref Range   Sodium 143 135 - 145 mmol/L   Potassium 3.8 3.5 - 5.1 mmol/L   Chloride 108 101 - 111 mmol/L   CO2 21 (L) 22 - 32 mmol/L   Glucose, Bld 110 (H) 65 - 99 mg/dL   BUN 6 6 - 20 mg/dL   Creatinine, Ser 0.66 0.44 - 1.00 mg/dL   Calcium 7.6 (L) 8.9 - 10.3 mg/dL   Total Protein 5.2 (L) 6.5 - 8.1 g/dL   Albumin 2.1 (L) 3.5 - 5.0 g/dL   AST 71 (H) 15 - 41 U/L   ALT 50 14 - 54 U/L   Alkaline Phosphatase 102 38 - 126 U/L   Total Bilirubin 18.9 (HH) 0.3 - 1.2 mg/dL    Comment: DELTA CHECK NOTED REPEATED TO VERIFY CRITICAL RESULT CALLED TO, READ BACK BY AND VERIFIED WITH: A [facility] RN 0050 10/27/15 A NAVARRO    GFR calc non Af Amer >60 >60 mL/min   GFR calc Af  Amer >60 >60 mL/min    Comment: (NOTE) The eGFR has been calculated using the CKD EPI equation. This calculation has not been validated in all clinical situations. eGFR's persistently <60 mL/min signify possible Chronic Kidney Disease.    Anion gap 14 5 - 15  Procalcitonin     Status: None   Collection Time: 10/27/15 12:01 AM  Result Value Ref Range   Procalcitonin 2.42 ng/mL    Comment:        Interpretation: PCT > 2 ng/mL: Systemic infection (sepsis) is likely, unless other causes are known. (NOTE)         ICU PCT Algorithm               Non ICU PCT Algorithm    ----------------------------     ------------------------------  PCT < 0.25 ng/mL                 PCT < 0.1 ng/mL     Stopping of antibiotics            Stopping of antibiotics       strongly encouraged.               strongly encouraged.    ----------------------------     ------------------------------       PCT level decrease by               PCT < 0.25 ng/mL       >= 80% from peak PCT       OR PCT 0.25 - 0.5 ng/mL          Stopping of antibiotics                                             encouraged.     Stopping of antibiotics           encouraged.    ----------------------------     ------------------------------       PCT level decrease by              PCT >= 0.25 ng/mL       < 80% from peak PCT        AND PCT >= 0.5 ng/mL            Continuing antibiotics                                               encouraged.       Continuing antibiotics            encouraged.    ----------------------------     ------------------------------     PCT level increase compared          PCT > 0.5 ng/mL         with peak PCT AND          PCT >= 0.5 ng/mL             Escalation of antibiotics                                          strongly encouraged.      Escalation of antibiotics        strongly encouraged.   Phosphorus     Status: None   Collection Time: 10/27/15 12:01 AM  Result Value Ref Range    Phosphorus 3.2 2.5 - 4.6 mg/dL  MRSA PCR Screening     Status: Abnormal   Collection Time: 10/27/15  1:00 AM  Result Value Ref Range   MRSA by PCR POSITIVE (A) NEGATIVE    Comment:        The GeneXpert MRSA Assay (FDA approved for NASAL specimens only), is one component of a comprehensive MRSA colonization surveillance program. It is not intended to diagnose MRSA infection nor to guide or monitor treatment for MRSA infections. RESULT CALLED TO, READ BACK BY AND VERIFIED WITH: A. WELCH RN AT [place] ON 04.14.17 BY Dreama Saa  Lactic acid, plasma     Status: None   Collection Time: 10/27/15  1:51 AM  Result Value Ref Range   Lactic Acid, Venous 1.7 0.5 - 2.0 mmol/L  Protime-INR     Status: Abnormal   Collection Time: 10/27/15  1:51 AM  Result Value Ref Range   Prothrombin Time 21.2 (H) 11.6 - 15.2 seconds   INR 1.84 (H) 0.00 - 1.49  APTT     Status: Abnormal   Collection Time: 10/27/15  1:51 AM  Result Value Ref Range   aPTT 43 (H) 24 - 37 seconds    Comment:        IF BASELINE aPTT IS ELEVATED, SUGGEST PATIENT RISK ASSESSMENT BE USED TO DETERMINE APPROPRIATE ANTICOAGULANT THERAPY.   Protime-INR     Status: Abnormal   Collection Time: 10/28/15  3:10 AM  Result Value Ref Range   Prothrombin Time 22.0 (H) 11.6 - 15.2 seconds   INR 1.93 (H) 0.00 - 1.49  Comprehensive metabolic panel     Status: Abnormal   Collection Time: 10/28/15  3:11 AM  Result Value Ref Range   Sodium 148 (H) 135 - 145 mmol/L   Potassium 3.5 3.5 - 5.1 mmol/L   Chloride 115 (H) 101 - 111 mmol/L   CO2 22 22 - 32 mmol/L   Glucose, Bld 155 (H) 65 - 99 mg/dL   BUN 6 6 - 20 mg/dL   Creatinine, Ser 0.81 0.44 - 1.00 mg/dL   Calcium 7.8 (L) 8.9 - 10.3 mg/dL   Total Protein 5.3 (L) 6.5 - 8.1 g/dL   Albumin 2.1 (L) 3.5 - 5.0 g/dL   AST 88 (H) 15 - 41 U/L   ALT 58 (H) 14 - 54 U/L   Alkaline Phosphatase 102 38 - 126 U/L   Total Bilirubin 20.3 (HH) 0.3 - 1.2 mg/dL    Comment: CRITICAL VALUE NOTED.  VALUE IS  CONSISTENT WITH PREVIOUSLY REPORTED AND CALLED VALUE.   GFR calc non Af Amer >60 >60 mL/min   GFR calc Af Amer >60 >60 mL/min    Comment: (NOTE) The eGFR has been calculated using the CKD EPI equation. This calculation has not been validated in all clinical situations. eGFR's persistently <60 mL/min signify possible Chronic Kidney Disease.    Anion gap 11 5 - 15  Ammonia     Status: Abnormal   Collection Time: 10/28/15  3:11 AM  Result Value Ref Range   Ammonia 163 (H) 9 - 35 umol/L    Dg Abd 1 View  10/27/2015  CLINICAL DATA:  NG tube placement. EXAM: ABDOMEN - 1 VIEW COMPARISON:  CT 09/01/2015. FINDINGS: NG tube noted. The tube appears to be seen in the hiatal hernia. Repositioning should be considered. Surgical clips sutures in the upper abdomen. No bowel distention. Bibasilar atelectasis and/or infiltrates. Right pleural effusion. IMPRESSION: 1. NG tube noted. The tip appears to be kinked and the patient's hiatal hernia. Repositioning should be considered. Surgical clips sutures in the upper abdomen. 2. Bibasilar atelectasis and/or infiltrates. Small right pleural effusion . Electronically Signed   By: Marcello Moores  Register   On: 10/27/2015 07:12   Ct Head Wo Contrast  10/26/2015  CLINICAL DATA:  Altered mental status EXAM: CT HEAD WITHOUT CONTRAST TECHNIQUE: Contiguous axial images were obtained from the base of the skull through the vertex without intravenous contrast. COMPARISON:  CT head 03/10/2013 FINDINGS: Mild atrophy given the patient's age.  Negative for hydrocephalus Negative for acute infarct. Negative for intracranial hemorrhage. No  mass or edema Left parietal scalp soft tissue thickening has improved since the prior study most likely scarring related to prior infection or trauma. Calvarium intact. IMPRESSION: Mild atrophy.  No acute intracranial abnormality. Electronically Signed   By: Franchot Gallo M.D.   On: 10/26/2015 11:51   US Abdomen Limited Ruq  10/27/2015  CLINICAL DATA:   Abnormal LFTs EXAM: US ABDOMEN LIMITED - RIGHT UPPER QUADRANT COMPARISON:  CT and ultrasound 09/01/2015 FINDINGS: Gallbladder: Sludge noted within the gallbladder. Gallbladder is mildly distended. No wall thickening or sonographic Murphy sign. Common bile duct: Diameter: Normal caliber, 6-7 mm Liver: Increased echotexture throughout the liver. Small amount of free fluid noted around the liver. No focal hepatic abnormality. IMPRESSION: Increased echotexture throughout the liver compatible with fatty infiltration. Small amount of perihepatic ascites. Gallbladder distention with gallbladder sludge. No wall thickening or sonographic Murphy sign. Electronically Signed   By: Rolm Baptise M.D.   On: 10/27/2015 08:51    ROS negative except above Blood pressure 128/73, pulse 95, temperature 98 F (36.7 C), temperature source Oral, resp. rate 21, height _0  (1.727 m), weight 84.3 kg (185 lb 13.6 oz), SpO2 95 %. Physical Exam patient clearly encephalopathic not responsive vital signs stable lungs are clear heart regular rate and rhythm abdomen some mild ascites no obvious tenderness little firm bilateral pedal edema labs and x-rays reviewed as well as previous endoscopy  Assessment/Plan: Severe alcoholic hepatitis and probable cirrhosis Plan: Ask IR to place feeding tube and okay to use lactulose and titrate to 3-6 bowel movements a day and would resume Solu-Medrol and she was on prednisone 30 a day in [place] and please call me this weekend if I could be of any further assistance otherwise our team will check on early next week  Liliahna Cudd E 10/28/2015, 10:00 AM

## 2015-10-28 NOTE — Progress Notes (Signed)
[facility] PROGRESS NOTE  Jennifer [name] BJY:[id number] DOB: Nov 25, 1970 DOA: 10/26/2015 [facility]  Assessment/Plan: Jennifer Seenarry L Killough is a 45 y.o. female past medical history of tobacco abuse, alcohol, history of decompensated liver cirrhosis ,pancreatitis chronic lower extremity edema recently discharged from [facility] for lower extremity cellulitis that is brought in for altered mental status, per report patient has been lethargic for last 5 days, and no responsive since day of admission.   Liver failure, cirrhosis. Acute decompensation  Jennifer [name] having a history of alcoholic liver disease, admitted in the month of February for acute alcohol-induced hepatitis. US; Increased echotexture throughout the liver compatible with fatty infiltration. Small amount of perihepatic ascites. Gallbladder distention with gallbladder sludge. No wall thickening or sonographic Murphy sign. GI consulted. Labs showing a total bilirubin of 20.3 with an INR of 2.9 and PTT of 22. Her ammonia level was also elevated at 163, increased from 102 days ago. Calculated meld score at 25 Case was discussed with GI, patient overall having a very poor prognosis. GI recommended starting Solu-Medrol 40 mg IV twice a day  Acute encephalopathy; hepatic encephalopathy;  She remains minimally responsive as lab work showed upward trend in ammonia level at 168 from 100 2 days ago. Case discussed with GI on 10/28/2015 who recommended consulting interventional radiology for NG tube placement under fluoroscopy where lactulose can be administered.  Sepsis (HCC)/ Leukocytosis: PNA. UTI ?  He has a chest x-ray that shows a possible infiltrate and UA shows too many to count white blood cells positive for nitrates and bacteria. She had been on broad-spectrum IV antimicrobial therapy with vancomycin and Zosyn up to this point. Microbiology reporting gram variable coccobacillus drawing from one out of 2  blood cultures Plan continue Zosyn IV, stop IV vancomycin for now  Alcohol abuse Monitor with CIWA, but due to her sedation will try to avoid benzodiazepines.  Medical goals of care Overall her prognosis is poor. I spoke with Dr. Neale BurlyFreeman of palliative care as family meeting has been planned for later this afternoon. She is currently a full code.   Code Status: Full Code.  Family Communication:  Disposition Plan: Remain in the step down.    Consultants:  GI  Palliative care   Procedures:  US  Antibiotics:  Vancomycin   Zosyn   HPI/Subjective: Patient is lethargic, no responsive. She is not following command.   Objective: Filed Vitals:   10/28/15 1200 10/28/15 1300  BP: 151/72 145/76  Pulse: 105 97  Temp:    Resp: 22 19    Intake/Output Summary (Last 24 hours) at 10/28/15 1420 Last data filed at 10/28/15 1300  Gross per 24 hour  Intake 2593.75 ml  Output    350 ml  Net 2243.75 ml   Filed Weights   10/26/15 2300  Weight: 84.3 kg (185 lb 13.6 oz)    Exam:   General:  Lethargic, min responsive, jaundiced  Cardiovascular: S 1, S 2 RRR  Respiratory: Crackle bilateral.   Abdomen: Bs present, soft mild distended  Musculoskeletal: bilateral edema  Data Reviewed: Basic Metabolic Panel:  Recent Labs Lab 10/24/15 1211 10/26/15 1006 10/27/15 0001 10/28/15 0311  NA 141 141 143 148*  K 3.9 4.0 3.8 3.5  CL 100 104 108 115*  CO2 22 25 21* 22  GLUCOSE 93 115* 110* 155*  BUN 5* 7 6 6   CREATININE 1.20* 1.02* 0.66 0.81  CALCIUM 8.3* 8.9 7.6* 7.8*  PHOS  --   --  3.2  --    Liver Function Tests:  Recent Labs Lab 10/23/15 1207 10/26/15 1006 10/27/15 0001 10/28/15 0311  AST 114* 98* 71* 88*  ALT 61* 77* 50 58*  ALKPHOS 129* 147* 102 102  BILITOT 21.8* 26.5* 18.9* 20.3*  PROT 5.7* 7.0 5.2* 5.3*  ALBUMIN 3.1* 3.0* 2.1* 2.1*    Recent Labs Lab 10/26/15 1006  LIPASE 22    Recent Labs Lab 10/26/15 1013 10/28/15 0311  AMMONIA 100* 163*    CBC:  Recent Labs Lab 10/26/15 1006 10/26/15 2357  WBC 21.0* 16.7*  NEUTROABS 17.5* 14.1*  HGB 13.3 10.8*  HCT 37.8 31.0*  MCV 90.6 90.1  PLT 331 247   Cardiac Enzymes: No results for input(s): CKTOTAL, CKMB, CKMBINDEX, TROPONINI in the last 168 hours. BNP (last 3 results)  Recent Labs  09/18/15 1454  BNP 126.0*    ProBNP (last 3 results) No results for input(s): PROBNP in the last 8760 hours.  CBG:  Recent Labs Lab 10/26/15 0919 10/26/15 1548  GLUCAP 102* 92    Recent Results (from the past 240 hour(s))  Urine culture     Status: Abnormal   Collection Time: 10/26/15  9:29 AM  Result Value Ref Range Status   Specimen Description URINE, CATHETERIZED  Final   Special Requests NONE  Final   Culture >=100,000 COLONIES/mL PROTEUS MIRABILIS (A)  Final   Report Status 10/28/2015 FINAL  Final   Organism ID, Bacteria PROTEUS MIRABILIS (A)  Final      Susceptibility   Proteus mirabilis - MIC*    AMPICILLIN <=2 SENSITIVE Sensitive     CEFAZOLIN <=4 SENSITIVE Sensitive     CEFTRIAXONE <=1 SENSITIVE Sensitive     CIPROFLOXACIN <=0.25 SENSITIVE Sensitive     GENTAMICIN <=1 SENSITIVE Sensitive     IMIPENEM 4 SENSITIVE Sensitive     NITROFURANTOIN 128 RESISTANT Resistant     TRIMETH/SULFA <=20 SENSITIVE Sensitive     AMPICILLIN/SULBACTAM <=2 SENSITIVE Sensitive     PIP/TAZO <=4 SENSITIVE Sensitive     * >=100,000 COLONIES/mL PROTEUS MIRABILIS  Blood culture (routine x 2)     Status: None (Preliminary result)   Collection Time: 10/26/15 10:09 AM  Result Value Ref Range Status   Specimen Description BLOOD RIGHT ANTECUBITAL  Final   Special Requests BOTTLES DRAWN AEROBIC AND ANAEROBIC 5CC  Final   Culture  Setup Time   Final    GRAM VARIABLE COCCOBACILLUS ANAEROBIC BOTTLE ONLY CRITICAL RESULT CALLED TO, READ BACK BY AND VERIFIED WITH: Jimmie Molly AT 0832 ON [id number] BY Lucienne Capers    Culture   Final    TOO YOUNG TO READ Performed at [facility]     Report Status PENDING  Incomplete  Blood culture (routine x 2)     Status: None (Preliminary result)   Collection Time: 10/26/15 10:10 AM  Result Value Ref Range Status   Specimen Description BLOOD RIGHT ARM  Final   Special Requests BOTTLES DRAWN AEROBIC ONLY 6CC  Final   Culture   Final    NO GROWTH 1 DAY Performed at [facility]    Report Status PENDING  Incomplete  MRSA PCR Screening     Status: Abnormal   Collection Time: 10/27/15  1:00 AM  Result Value Ref Range Status   MRSA by PCR POSITIVE (A) NEGATIVE Final    Comment:        The GeneXpert MRSA Assay (FDA approved for NASAL specimens only), is one component of a  comprehensive MRSA colonization surveillance program. It is not intended to diagnose MRSA infection nor to guide or monitor treatment for MRSA infections. RESULT CALLED TO, READ BACK BY AND VERIFIED WITH: AWebb Silversmith RN AT [phone] ON 04.14.17 BY SHUEA      Studies: Dg Abd 1 View  10/27/2015  CLINICAL DATA:  NG tube placement. EXAM: ABDOMEN - 1 VIEW COMPARISON:  CT 09/01/2015. FINDINGS: NG tube noted. The tube appears to be seen in the hiatal hernia. Repositioning should be considered. Surgical clips sutures in the upper abdomen. No bowel distention. Bibasilar atelectasis and/or infiltrates. Right pleural effusion. IMPRESSION: 1. NG tube noted. The tip appears to be kinked and the patient's hiatal hernia. Repositioning should be considered. Surgical clips sutures in the upper abdomen. 2. Bibasilar atelectasis and/or infiltrates. Small right pleural effusion . Electronically Signed   By: Maisie Fus  Register   On: 10/27/2015 07:12   US Abdomen Limited Ruq  10/27/2015  CLINICAL DATA:  Abnormal LFTs EXAM: US ABDOMEN LIMITED - RIGHT UPPER QUADRANT COMPARISON:  CT and ultrasound 09/01/2015 FINDINGS: Gallbladder: Sludge noted within the gallbladder. Gallbladder is mildly distended. No wall thickening or sonographic Murphy sign. Common bile duct: Diameter: Normal caliber,  6-7 mm Liver: Increased echotexture throughout the liver. Small amount of free fluid noted around the liver. No focal hepatic abnormality. IMPRESSION: Increased echotexture throughout the liver compatible with fatty infiltration. Small amount of perihepatic ascites. Gallbladder distention with gallbladder sludge. No wall thickening or sonographic Murphy sign. Electronically Signed   By: Charlett Nose M.D.   On: 10/27/2015 08:51    Scheduled Meds: . antiseptic oral rinse  7 mL Mouth Rinse q12n4p  . chlorhexidine  15 mL Mouth Rinse BID  . Chlorhexidine Gluconate Cloth  6 each Topical q morning - 10a  . lactulose  300 mL Rectal TID  . methylPREDNISolone (SOLU-MEDROL) injection  40 mg Intravenous BID  . mupirocin ointment  1 application Nasal BID  . pantoprazole (PROTONIX) IV  40 mg Intravenous Q12H  . piperacillin-tazobactam (ZOSYN)  IV  3.375 g Intravenous Q8H  . sodium chloride flush  3 mL Intravenous Q12H   Continuous Infusions: . dextrose 125 mL/hr at 10/28/15 0610    Active Problems:   Alcohol abuse   Elevated bilirubin   Thrombocytopenia (HCC)   Hepatic encephalopathy (HCC)   Leukocytosis   Sepsis (HCC)   Decompensated liver disease (HCC)   Palliative care encounter   Goals of care, counseling/discussion   HCAP (healthcare-associated pneumonia)    Time spent: 35 minutes.     Jeralyn Bennett  [facility] Pager [phone]. If 7PM-7AM, please contact night-coverage at [email or web address], password [facility] 10/28/2015, 2:20 PM  LOS: 2 days

## 2015-10-28 NOTE — Progress Notes (Signed)
Consultation Note Date: 10/28/2015   Patient Name: Jennifer [name]  DOB: [birth year]  MRN: [id number]  Age / Sex: 45 y.o., female  PCP: [place] Referring Physician: Kelvin Cellar, MD  Reason for Consultation: Establishing goals of care  Clinical Assessment/Narrative: I met again today with patient's family. Her mother, father, fiance, daughter, and friend were present.  We also called her son via phone.  They report that physicians have been doing a good job explaining things to them, and they understand the severity of her condition.  We reviewed her clinical course to this point in time as well as possible pathway's forward.  I expressed to them my concern about her current clinical condition.  I expressed concern that, if her condition were to continue to worsen, she would likely never be well enough to enjoy the things they report being most important to her if increasingly aggressive care, such as intubation, CPR, or pressors were initiated.  SUMMARY OF RECOMMENDATIONS - Met with multiple family members today.  We also talked to her son, Jennifer [name] via phone. They seem to understand the extent and seriousness of her condition. - We discussed CODE STATUS and family is in agreement that she would like to pursue all aggressive interventions up until time of full cardiac arrest.  Changed to partial code with no CPR in the event of arrest. - We have set up a follow-up meeting for Monday at 9 AM to reevaluate her condition. We discussed that if she shown no improvement to continue conversation about long and short-term goals of care.   Code Status/Advance Care Planning: Limited code    Code Status Orders        Start     Ordered   10/26/15 2128  Full code   Continuous     10/26/15 2128    Code Status History    Date Active Date Inactive Code Status Order ID Comments User Context   09/28/2015   9:30 PM 10/17/2015  8:33 PM Full Code [id number]  Quintella Baton, MD Inpatient   09/01/2015  5:54 PM 09/05/2015  6:55 PM Full Code [id number]  Truett Mainland, DO Inpatient   10/15/2014  1:15 AM 10/18/2014  6:16 PM Full Code [id number]  Orvan Falconer, MD Inpatient   01/23/2014  3:37 AM 01/25/2014  9:45 PM Full Code [id number]  Oswald Hillock, MD Inpatient      Other Directives:None  Symptom Management:   Unresponsive.  No signs of distress  Palliative Prophylaxis:   Aspiration, Bowel Regimen and Frequent Pain Assessment  Psycho-social/Spiritual:  Support System: Fair   Prognosis: Unable to determine, however she remains critically ill and I expressed to family my concern that this may be a terminal admission  Discharge Planning: To be determined   Chief Complaint/ Primary Diagnoses: Present on Admission:  . Alcohol abuse . Elevated bilirubin . Sepsis ([place]) . Thrombocytopenia ([place])  I have reviewed the medical record, interviewed the patient and family, and examined the patient. The following aspects are pertinent.  Past Medical History  Diagnosis Date  . Gastric bypass status for obesity   . Pancreatitis   . Depression   . Alcohol abuse   . Shortness of breath dyspnea   . GERD (gastroesophageal reflux disease)   . Gastric erosion 10/12/2015  . Acute blood loss anemia 09/30/2015  . 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 Alcoholic hepatitis without ascites    Social History   Social History  . Marital Status: Divorced  Spouse Name: N/A  . Number of Children: N/A  . Years of Education: N/A   Social History Main Topics  . Smoking status: Current Every Day Smoker -- 0.50 packs/day    Types: Cigarettes  . Smokeless tobacco: None  . Alcohol Use: Yes     Comment: daily,  vodka  1 pint per day, last time drank alcohol  march 20th 2017  . Drug Use: No  . Sexual Activity: Yes    Birth Control/ Protection: None   Other Topics Concern  . 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   History reviewed. No pertinent family history. Scheduled Meds: . antiseptic oral rinse  7 mL Mouth Rinse q12n4p  . chlorhexidine  15 mL Mouth Rinse BID  . Chlorhexidine Gluconate Cloth  6 each Topical q morning - 10a  . lactulose  30 g Oral TID  . methylPREDNISolone (SOLU-MEDROL) injection  40 mg Intravenous BID  . mupirocin ointment  1 application Nasal BID  . pantoprazole (PROTONIX) IV  40 mg Intravenous Q12H  . piperacillin-tazobactam (ZOSYN)  IV  3.375 g Intravenous Q8H  . sodium chloride flush  3 mL Intravenous Q12H   Continuous Infusions: . dextrose 125 mL/hr at 10/28/15 0610   PRN Meds:.ondansetron **OR** ondansetron (ZOFRAN) IV Medications Prior to Admission:  Prior to Admission medications   Medication Sig Start Date End Date Taking? Authorizing Provider  camphor-[facility]) lotion Apply topically as needed for itching. 10/17/15  Yes Rexene Alberts, MD  fluconazole (DIFLUCAN) 100 MG tablet Take 1 tablet (100 mg total) by mouth daily. For yeast skin infection. 10/17/15  Yes Rexene Alberts, MD  hydrOXYzine (ATARAX/VISTARIL) 25 MG tablet Take 1 tablet (25 mg total) by mouth 3 (three) times daily as needed for itching. 10/17/15  Yes Rexene Alberts, MD  lactulose (CHRONULAC) 10 GM/15ML solution Take 15 mLs (10 g total) by mouth daily. Medication to keep your ammonia level down. It is also a laxative. 10/17/15  Yes Rexene Alberts, MD  midodrine (PROAMATINE) 5 MG tablet Take 1 tablet (5 mg total) by mouth 2 (two) times daily with a meal. Medication to keep your blood pressure from dropping too low. 10/17/15  Yes Rexene Alberts, MD  Multiple Vitamin (MULTIVITAMIN WITH MINERALS) TABS tablet Take 1 tablet by mouth daily. 10/18/14  Yes Lezlie Octave Black, NP  pantoprazole (PROTONIX) 40 MG tablet Take 1 tablet (40 mg total) by mouth 2 (two) times daily before a meal. For your stomach ulcer. 10/17/15  Yes Rexene Alberts, MD  prednisoLONE (ORAPRED) 15  MG/5ML solution Take 10 mLs (30 mg total) by mouth daily before breakfast. Medication for your liver. 10/17/15  Yes Rexene Alberts, MD  thiamine 100 MG tablet Take 1 tablet (100 mg total) by mouth daily. 10/17/15  Yes Rexene Alberts, MD  nystatin ointment (MYCOSTATIN) Apply topically 2 (two) times daily. For skin yeast infection. Patient not taking: Reported on 10/26/2015 10/17/15   Rexene Alberts, MD  sucralfate (CARAFATE) 1 GM/10ML suspension Take 10 mLs (1 g total) by mouth 4 (four) times daily -  with meals and at bedtime. For your stomach ulcer. Patient not taking: Reported on 10/26/2015 10/17/15   Rexene Alberts, MD   Allergies  Allergen Reactions  . Aspirin Other (See Comments)    Cant take due to gastric bypass     Review of Systems  Unable to obtain  Physical Exam   General: Lethargic, not responsive  to verbal or tactile stimulation  Cardiovascular: S 1, S 2 RRR  Respiratory: Crackles bilaterally.   Abdomen: Bs present, soft mild distended  Musculoskeletal: bilateral edema  Vital Signs: BP 142/67 mmHg  Pulse 95  Temp(Src) 97.7 F (36.5 C) (Oral)  Resp 19  Ht _0  (1.727 m)  Wt 84.3 kg (185 lb 13.6 oz)  BMI 28.26 kg/m2  SpO2 98%  LMP   SpO2: SpO2: 98 % O2 Device:SpO2: 98 % O2 Flow Rate: .O2 Flow Rate (L/min): 3 L/min  IO: Intake/output summary:   Intake/Output Summary (Last 24 hours) at 10/28/15 2145 Last data filed at 10/28/15 2000  Gross per 24 hour  Intake   3140 ml  Output    360 ml  Net   2780 ml    LBM: Last BM Date: 10/28/15 Baseline Weight: Weight: 84.3 kg (185 lb 13.6 oz) Most recent weight: Weight: 84.3 kg (185 lb 13.6 oz)      Palliative Assessment/Data:  Flowsheet Rows        Most Recent Value   Intake Tab    Referral Department  Hospitalist   Unit at Time of Referral  ICU   Palliative Care Primary Diagnosis  Sepsis/Infectious Disease   Date Notified  10/27/15   Palliative Care Type  New Palliative care   Date of Admission  10/26/15   Date  first seen by Palliative Care  10/27/15   # of days Palliative referral response time  0 Day(s)   # of days IP prior to Palliative referral  1   Clinical Assessment    Palliative Performance Scale Score  10%   Pain Max last 24 hours  Not able to report   Pain Min Last 24 hours  Not able to report   Psychosocial & Spiritual Assessment    Palliative Care Outcomes    Patient/Family meeting held?  Yes   Who was at the meeting?  Parents, fianc, one of her 3 adult children   Palliative Care Outcomes  Clarified goals of care, ACP counseling assistance      Additional Data Reviewed:  CBC:    Component Value Date/Time   WBC 16.7* 10/26/2015 2357   HGB 10.8* 10/26/2015 2357   HCT 31.0* 10/26/2015 2357   PLT 247 10/26/2015 2357   MCV 90.1 10/26/2015 2357   NEUTROABS 14.1* 10/26/2015 2357   LYMPHSABS 1.4 10/26/2015 2357   MONOABS 1.1* 10/26/2015 2357   EOSABS 0.0 10/26/2015 2357   BASOSABS 0.0 10/26/2015 2357   Comprehensive Metabolic Panel:    Component Value Date/Time   NA 148* 10/28/2015 0311   K 3.5 10/28/2015 0311   CL 115* 10/28/2015 0311   CO2 22 10/28/2015 0311   BUN 6 10/28/2015 0311   CREATININE 0.81 10/28/2015 0311   CREATININE 1.20* 10/24/2015 1211   GLUCOSE 155* 10/28/2015 0311   CALCIUM 7.8* 10/28/2015 0311   AST 88* 10/28/2015 0311   ALT 58* 10/28/2015 0311   ALKPHOS 102 10/28/2015 0311   BILITOT 20.3* 10/28/2015 0311   PROT 5.3* 10/28/2015 0311   ALBUMIN 2.1* 10/28/2015 0311     Time In: 1300 Time Out: 1420 Time Total: 80 Greater than 50%  of this time was spent counseling and coordinating care related to the above assessment and plan.  Signed by: Micheline Rough, MD  Micheline Rough, MD  10/28/2015, 9:45 PM  Please contact Palliative Medicine Team phone at [phone] for questions and concerns.

## 2015-10-29 ENCOUNTER — Inpatient Hospital Stay (HOSPITAL_COMMUNITY): Payer: Medicaid Other

## 2015-10-29 LAB — CBC
HCT: 32.9 % — ABNORMAL LOW (ref 36.0–46.0)
Hemoglobin: 12 g/dL (ref 12.0–15.0)
MCH: 32 pg (ref 26.0–34.0)
MCHC: 36.5 g/dL — ABNORMAL HIGH (ref 30.0–36.0)
MCV: 87.7 fL (ref 78.0–100.0)
PLATELETS: 186 10*3/uL (ref 150–400)
RBC: 3.75 MIL/uL — AB (ref 3.87–5.11)
RDW: 21.8 % — AB (ref 11.5–15.5)
WBC: 19.5 10*3/uL — AB (ref 4.0–10.5)

## 2015-10-29 LAB — COMPREHENSIVE METABOLIC PANEL
ALBUMIN: 2 g/dL — AB (ref 3.5–5.0)
ALT: 87 U/L — ABNORMAL HIGH (ref 14–54)
ANION GAP: 11 (ref 5–15)
AST: 147 U/L — AB (ref 15–41)
Alkaline Phosphatase: 107 U/L (ref 38–126)
BUN: 6 mg/dL (ref 6–20)
CHLORIDE: 108 mmol/L (ref 101–111)
CO2: 18 mmol/L — ABNORMAL LOW (ref 22–32)
Calcium: 7.8 mg/dL — ABNORMAL LOW (ref 8.9–10.3)
Creatinine, Ser: 0.48 mg/dL (ref 0.44–1.00)
GFR calc Af Amer: >60 mL/min (ref >60)
GLUCOSE: 171 mg/dL — AB (ref 65–99)
POTASSIUM: 3.8 mmol/L (ref 3.5–5.1)
Sodium: 137 mmol/L (ref 135–145)
Total Bilirubin: 21.6 mg/dL (ref 0.3–1.2)
Total Protein: 5.4 g/dL — ABNORMAL LOW (ref 6.5–8.1)

## 2015-10-29 LAB — OCCULT BLOOD X 1 CARD TO LAB, STOOL: Fecal Occult Bld: POSITIVE — AB

## 2015-10-29 LAB — AMMONIA: AMMONIA: 143 umol/L — AB (ref 9–35)

## 2015-10-29 MED ORDER — LACTULOSE ENEMA
300.0000 mL | Freq: Three times a day (TID) | ORAL | Status: DC
  Administered 2015-10-29 (×2): 300 mL via RECTAL
  Filled 2015-10-29 (×6): qty 300

## 2015-10-29 MED ORDER — LACTULOSE 10 GM/15ML PO SOLN
30.0000 g | Freq: Four times a day (QID) | ORAL | Status: DC
  Administered 2015-10-29: 30 g via ORAL
  Filled 2015-10-29: qty 45

## 2015-10-29 NOTE — Progress Notes (Signed)
[facility] PROGRESS NOTE  Jennifer [name] WUJ:[id number] DOB: 1971/02/02 DOA: 10/26/2015 PCP: [facility]  Assessment/Plan: Jennifer [name] is a 45 y.o. female past medical history of tobacco abuse, alcohol, history of decompensated liver cirrhosis ,pancreatitis chronic lower extremity edema recently discharged from [facility] for lower extremity cellulitis that is brought in for altered mental status, per report patient has been lethargic for last 5 days, and no responsive since day of admission.   Liver failure, cirrhosis. Acute decompensation  Jennifer [name] having a history of alcoholic liver disease, admitted in the month of February for acute alcohol-induced hepatitis. [place]; Increased echotexture throughout the liver compatible with fatty infiltration. Small amount of perihepatic ascites. Gallbladder distention with gallbladder sludge. No wall thickening or sonographic Murphy sign. GI consulted. Labs showing a total bilirubin of 20.3 with an INR of 2.9 and PTT of 22. Her ammonia level was also elevated at 163, increased from 102 days ago. Calculated meld score at 25 Case was discussed with GI, patient overall having a very poor prognosis. GI recommended starting Solu-Medrol 40 mg IV twice a day  Acute encephalopathy; hepatic encephalopathy;  -She remains minimally responsive as lab work showed upward trend in ammonia level at 168 from 100 2 days ago. -Case discussed with GI on 10/28/2015 who recommended consulting interventional radiology for NG tube placement under fluoroscopy where lactulose can be administered. -Interventional radiology placed NG tube on 10/29/2015. They reported unable to get past distal esophagus due to patient's distal esophageal anastomosis. Instructed nursing staff to see if she can tolerate administration of lactulose at a very slow/cautious rate. Unfortunately nursing staff reporting concerns for aspiration with administration of  lactulose. -Due to concerns for aspiration will discontinue lactulose through NG tube and resume lactulose enemas 3 times a day.  Sepsis (HCC)/ Leukocytosis: PNA. UTI ?  -Chest x-ray performed on 10/26/2015 that showed right lower lobe infiltrate suspicious for pneumonia/aspiration. UA shows too many to count white blood cells positive for nitrates and bacteria. -She had been on broad-spectrum IV antimicrobial therapy with vancomycin and Zosyn  -Microbiology reporting gram variable coccobacillus drawing from one out of 2 blood cultures -Plan continue Zosyn IV -Nursing staff reporting concerns for aspiration after administration of lactulose, follow-up on repeat chest x-ray  Alcohol abuse Monitor with CIWA, but due to her sedation will try to avoid benzodiazepines.  Medical goals of care Overall her prognosis is poor.  On 10/29/2015 family members having meeting with Dr. Neale Burly of palliative care. She remains a partial code, plan to meet Monday morning to discuss progress.  Code Status: Full Code.  Family Communication:  Disposition Plan: Remain in the step down.    Consultants:  GI  Palliative care   Procedures:  [place]  Antibiotics:  Vancomycin   Zosyn   HPI/Subjective: Patient is lethargic, no responsive. She is not following command.   Objective: Filed Vitals:   10/29/15 1000 10/29/15 1100  BP: 120/56 153/69  Pulse: 46 58  Temp:    Resp: 13 27    Intake/Output Summary (Last 24 hours) at 10/29/15 1402 Last data filed at 10/29/15 1300  Gross per 24 hour  Intake   3190 ml  Output    935 ml  Net   2255 ml   Filed Weights   10/26/15 2300  Weight: 84.3 kg (185 lb 13.6 oz)    Exam:   General:  Lethargic, min responsive, jaundiced  Cardiovascular: S 1, S 2 RRR  Respiratory: Crackle bilateral.  Abdomen: Bs present, soft mild distended  Musculoskeletal: bilateral edema  Data Reviewed: Basic Metabolic Panel:  Recent Labs Lab 10/24/15 1211  10/26/15 1006 10/27/15 0001 10/28/15 0311 10/29/15 0321  NA 141 141 143 148* 137  K 3.9 4.0 3.8 3.5 3.8  CL 100 104 108 115* 108  CO2 22 25 21* 22 18*  GLUCOSE 93 115* 110* 155* 171*  BUN 5* 7 6 6 6   CREATININE 1.20* 1.02* 0.66 0.81 0.48  CALCIUM 8.3* 8.9 7.6* 7.8* 7.8*  PHOS  --   --  3.2  --   --    Liver Function Tests:  Recent Labs Lab 10/23/15 1207 10/26/15 1006 10/27/15 0001 10/28/15 0311 10/29/15 0321  AST 114* 98* 71* 88* 147*  ALT 61* 77* 50 58* 87*  ALKPHOS 129* 147* 102 102 107  BILITOT 21.8* 26.5* 18.9* 20.3* 21.6*  PROT 5.7* 7.0 5.2* 5.3* 5.4*  ALBUMIN 3.1* 3.0* 2.1* 2.1* 2.0*    Recent Labs Lab 10/26/15 1006  LIPASE 22    Recent Labs Lab 10/26/15 1013 10/28/15 0311 10/29/15 0322  AMMONIA 100* 163* 143*   CBC:  Recent Labs Lab 10/26/15 1006 10/26/15 2357 10/29/15 0356  WBC 21.0* 16.7* 19.5*  NEUTROABS 17.5* 14.1*  --   HGB 13.3 10.8* 12.0  HCT 37.8 31.0* 32.9*  MCV 90.6 90.1 87.7  PLT 331 247 186   Cardiac Enzymes: No results for input(s): CKTOTAL, CKMB, CKMBINDEX, TROPONINI in the last 168 hours. BNP (last 3 results)  Recent Labs  09/18/15 1454  BNP 126.0*    ProBNP (last 3 results) No results for input(s): PROBNP in the last 8760 hours.  CBG:  Recent Labs Lab 10/26/15 0919 10/26/15 1548  GLUCAP 102* 92    Recent Results (from the past 240 hour(s))  Urine culture     Status: Abnormal   Collection Time: 10/26/15  9:29 AM  Result Value Ref Range Status   Specimen Description URINE, CATHETERIZED  Final   Special Requests NONE  Final   Culture >=100,000 COLONIES/mL PROTEUS MIRABILIS (A)  Final   Report Status 10/28/2015 FINAL  Final   Organism ID, Bacteria PROTEUS MIRABILIS (A)  Final      Susceptibility   Proteus mirabilis - MIC*    AMPICILLIN <=2 SENSITIVE Sensitive     CEFAZOLIN <=4 SENSITIVE Sensitive     CEFTRIAXONE <=1 SENSITIVE Sensitive     CIPROFLOXACIN <=0.25 SENSITIVE Sensitive     GENTAMICIN <=1  SENSITIVE Sensitive     IMIPENEM 4 SENSITIVE Sensitive     NITROFURANTOIN 128 RESISTANT Resistant     TRIMETH/SULFA <=20 SENSITIVE Sensitive     AMPICILLIN/SULBACTAM <=2 SENSITIVE Sensitive     PIP/TAZO <=4 SENSITIVE Sensitive     * >=100,000 COLONIES/mL PROTEUS MIRABILIS  Blood culture (routine x 2)     Status: None (Preliminary result)   Collection Time: 10/26/15 10:09 AM  Result Value Ref Range Status   Specimen Description BLOOD RIGHT ANTECUBITAL  Final   Special Requests BOTTLES DRAWN AEROBIC AND ANAEROBIC 5CC  Final   Culture  Setup Time   Final    GRAM VARIABLE COCCOBACILLUS ANAEROBIC BOTTLE ONLY CRITICAL RESULT CALLED TO, READ BACK BY AND VERIFIED WITH: Jimmie MollyS. DILLON,RN AT [id number] ON [id number] BY Lucienne CapersS. YARBROUGH    Culture   Final    CULTURE REINCUBATED FOR BETTER GROWTH [facility]    Report Status PENDING  Incomplete  Blood culture (routine x 2)     Status: None (Preliminary  result)   Collection Time: 10/26/15 10:10 AM  Result Value Ref Range Status   Specimen Description BLOOD RIGHT ARM  Final   Special Requests BOTTLES DRAWN AEROBIC ONLY 6CC  Final   Culture   Final    NO GROWTH 2 DAYS Performed at [facility]    Report Status PENDING  Incomplete  MRSA PCR Screening     Status: Abnormal   Collection Time: 10/27/15  1:00 AM  Result Value Ref Range Status   MRSA by PCR POSITIVE (A) NEGATIVE Final    Comment:        The GeneXpert MRSA Assay (FDA approved for NASAL specimens only), is one component of a comprehensive MRSA colonization surveillance program. It is not intended to diagnose MRSA infection nor to guide or monitor treatment for MRSA infections. RESULT CALLED TO, READ BACK BY AND VERIFIED WITH: AWebb Silversmith RN AT [phone] ON 04.14.17 BY SHUEA      Studies: Dg Basil Dess Tube Plc W/fl W/rad  10/28/2015  CLINICAL DATA:  Patient for NG tube placement. EXAM: NASO G TUBE PLACEMENT WITH FL AND WITH RAD FLUOROSCOPY TIME:  Fluoroscopy Time (in  minutes and seconds): 5 minutes 15 seconds. COMPARISON:  CT abdomen pelvis 09/01/2015 FINDINGS: Feeding tube was inserted under fluoroscopic guidance. The tube and side-port were not able to be passed into the stomach given the patient's distal esophageal anastomosis. The tube was left in place. IMPRESSION: Attempted NG tube placement with the tip and side-port in the distal esophagus. These results were called by telephone at the time of interpretation on 10/28/2015 at 3:00 pm to Dr. Jeralyn Bennett , who verbally acknowledged these results. Electronically Signed   By: Annia Belt M.D.   On: 10/28/2015 15:36    Scheduled Meds: . antiseptic oral rinse  7 mL Mouth Rinse q12n4p  . chlorhexidine  15 mL Mouth Rinse BID  . Chlorhexidine Gluconate Cloth  6 each Topical q morning - 10a  . lactulose  300 mL Rectal TID  . methylPREDNISolone (SOLU-MEDROL) injection  40 mg Intravenous BID  . mupirocin ointment  1 application Nasal BID  . pantoprazole (PROTONIX) IV  40 mg Intravenous Q12H  . piperacillin-tazobactam (ZOSYN)  IV  3.375 g Intravenous Q8H  . sodium chloride flush  3 mL Intravenous Q12H   Continuous Infusions: . dextrose 125 mL/hr at 10/29/15 0559    Active Problems:   Alcohol abuse   Elevated bilirubin   Thrombocytopenia (HCC)   Hepatic encephalopathy (HCC)   Leukocytosis   Sepsis (HCC)   Decompensated liver disease (HCC)   Palliative care encounter   Goals of care, counseling/discussion   HCAP (healthcare-associated pneumonia)    Time spent: 30 minutes.     Jeralyn Bennett  [facility] Pager [phone]. If 7PM-7AM, please contact night-coverage at [email or web address], password [facility] 10/29/2015, 2:02 PM  LOS: 3 days

## 2015-10-30 LAB — COMPREHENSIVE METABOLIC PANEL
ALBUMIN: 1.9 g/dL — AB (ref 3.5–5.0)
ALT: 102 U/L — AB (ref 14–54)
AST: 148 U/L — AB (ref 15–41)
Alkaline Phosphatase: 122 U/L (ref 38–126)
Anion gap: 14 (ref 5–15)
BUN: 6 mg/dL (ref 6–20)
CHLORIDE: 105 mmol/L (ref 101–111)
CO2: 18 mmol/L — ABNORMAL LOW (ref 22–32)
Calcium: 8 mg/dL — ABNORMAL LOW (ref 8.9–10.3)
GLUCOSE: 160 mg/dL — AB (ref 65–99)
Potassium: 3.5 mmol/L (ref 3.5–5.1)
Sodium: 137 mmol/L (ref 135–145)
Total Bilirubin: 21.8 mg/dL (ref 0.3–1.2)
Total Protein: 5.6 g/dL — ABNORMAL LOW (ref 6.5–8.1)

## 2015-10-30 LAB — CBC
HEMATOCRIT: 34.1 % — AB (ref 36.0–46.0)
Hemoglobin: 12.6 g/dL (ref 12.0–15.0)
MCH: 32.4 pg (ref 26.0–34.0)
MCHC: 37 g/dL — AB (ref 30.0–36.0)
MCV: 87.7 fL (ref 78.0–100.0)
PLATELETS: 194 10*3/uL (ref 150–400)
RBC: 3.89 MIL/uL (ref 3.87–5.11)
RDW: 22.2 % — AB (ref 11.5–15.5)
WBC: 42.6 10*3/uL — AB (ref 4.0–10.5)

## 2015-10-30 LAB — AMMONIA: AMMONIA: 120 umol/L — AB (ref 9–35)

## 2015-10-30 MED ORDER — GLYCOPYRROLATE 0.2 MG/ML IJ SOLN
0.2000 mg | INTRAMUSCULAR | Status: DC | PRN
Start: 2015-10-30 — End: 2015-11-01
  Filled 2015-10-30: qty 1

## 2015-10-30 MED ORDER — MORPHINE SULFATE (PF) 2 MG/ML IV SOLN
2.0000 mg | INTRAVENOUS | Status: DC | PRN
  Administered 2015-10-30: 2 mg via INTRAVENOUS
  Filled 2015-10-30: qty 1

## 2015-10-30 MED ORDER — ONDANSETRON HCL 4 MG/2ML IJ SOLN: 4.0000 mg | Freq: Four times a day (QID) | INTRAMUSCULAR | Status: DC | PRN

## 2015-10-30 MED ORDER — LORAZEPAM 1 MG PO TABS: 1.0000 mg | ORAL_TABLET | ORAL | Status: DC | PRN

## 2015-10-30 MED ORDER — HALOPERIDOL LACTATE 5 MG/ML IJ SOLN: 0.5000 mg | INTRAMUSCULAR | Status: DC | PRN

## 2015-10-30 MED ORDER — ACETAMINOPHEN 325 MG PO TABS: 650.0000 mg | ORAL_TABLET | Freq: Four times a day (QID) | ORAL | Status: DC | PRN

## 2015-10-30 MED ORDER — ACETAMINOPHEN 650 MG RE SUPP: 650.0000 mg | Freq: Four times a day (QID) | RECTAL | Status: DC | PRN

## 2015-10-30 MED ORDER — SODIUM CHLORIDE 0.9% FLUSH
3.0000 mL | Freq: Two times a day (BID) | INTRAVENOUS | Status: DC
Start: 2015-10-30 — End: 2015-11-01
  Administered 2015-10-30 – 2015-11-01 (×3): 3 mL via INTRAVENOUS

## 2015-10-30 MED ORDER — SODIUM CHLORIDE 0.9% FLUSH: 3.0000 mL | INTRAVENOUS | Status: DC | PRN

## 2015-10-30 MED ORDER — HALOPERIDOL 0.5 MG PO TABS
0.5000 mg | ORAL_TABLET | ORAL | Status: DC | PRN
  Filled 2015-10-30: qty 1

## 2015-10-30 MED ORDER — BISACODYL 10 MG RE SUPP: 10.0000 mg | Freq: Every day | RECTAL | Status: DC | PRN

## 2015-10-30 MED ORDER — ONDANSETRON 4 MG PO TBDP
4.0000 mg | ORAL_TABLET | Freq: Four times a day (QID) | ORAL | Status: DC | PRN
  Filled 2015-10-30: qty 1

## 2015-10-30 MED ORDER — GLYCOPYRROLATE 1 MG PO TABS
1.0000 mg | ORAL_TABLET | ORAL | Status: DC | PRN
  Filled 2015-10-30: qty 1

## 2015-10-30 MED ORDER — LORAZEPAM 2 MG/ML PO CONC: 1.0000 mg | ORAL | Status: DC | PRN

## 2015-10-30 MED ORDER — HALOPERIDOL LACTATE 2 MG/ML PO CONC
0.5000 mg | ORAL | Status: DC | PRN
  Filled 2015-10-30: qty 0.3

## 2015-10-30 MED ORDER — LORAZEPAM 2 MG/ML IJ SOLN: 1.0000 mg | INTRAMUSCULAR | Status: DC | PRN

## 2015-10-30 MED ORDER — SODIUM CHLORIDE 0.9 % IV SOLN: 250.0000 mL | INTRAVENOUS | Status: DC | PRN

## 2015-10-30 NOTE — Progress Notes (Signed)
Date:  October 30, 2015 Chart reviewed for concurrent status and case management needs. Will continue to follow patient for changes and needs: Rhonda Davis, BSN, RN, CCM   [phone] 

## 2015-10-30 NOTE — Progress Notes (Signed)
Quick Note:  INR is around her baseline. Cr is just a smidge above her baseline. Does she have a PCP? How is she feeling? Let's repeat HFP now. If she has any difficulty urinating, dry mouth, signs of dehydration, etc, needs to seek medical attention. The biggest thing is that she is able to tolerate eating, drinking, no fevers, and hopefully she will continue to improve as an outpatient. I'm glad to see her renal function is around her baseline as well as INR. ______

## 2015-10-30 NOTE — Progress Notes (Signed)
Nutrition Brief Note  Pt identified as having a low Braden Score.   Chart reviewed. Pt now transitioning to comfort care.  No nutrition interventions warranted at this time.  Please consult as needed.   Salam Chesterfield, MS, RD, LDN Pager: [phone] After Hours Pager: [phone]    

## 2015-10-30 NOTE — Progress Notes (Signed)
[facility] PROGRESS NOTE  Jennifer [name] ZOX:[id number] DOB: [birth year] DOA: 10/26/2015 PCP: [facility]  Assessment/Plan: Jennifer [name] is a 45 y.o. female past medical history of tobacco abuse, alcohol, history of decompensated liver cirrhosis ,pancreatitis chronic lower extremity edema recently discharged from [facility] for lower extremity cellulitis that is brought in for altered mental status, per report patient has been lethargic for last 5 days, and no responsive since day of admission.   Liver failure, cirrhosis. Acute decompensation  -Jennifer [name] having a history of alcoholic liver disease, admitted in the month of February for acute alcohol-induced hepatitis. [place]; Increased echotexture throughout the liver compatible with fatty infiltration. Small amount of perihepatic ascites. Gallbladder distention with gallbladder sludge. No wall thickening or sonographic Murphy sign. -GI consulted. -Labs showing a total bilirubin of 20.3 with an INR of 2.9 and PTT of 22. Her ammonia level was also elevated at 163, increased from 102 days ago. Calculated meld score at 25 -Case was discussed with GI, patient overall having a very poor prognosis. GI recommended starting Solu-Medrol 40 mg IV twice a day -On 10/30/2015 after reassessing Jennifer [name]'s clinical status, having minimal responsiveness, worsening transaminases, white count of 42,600, probable fulminant hepatic failure, family members expressed wishes to Dr. Neale Burly of palliative care to transition to full comfort measures.  Acute encephalopathy; hepatic encephalopathy;  -She remains minimally responsive as lab work showed upward trend in ammonia level at 168 from 100 2 days ago. -Case discussed with GI on 10/28/2015 who recommended consulting interventional radiology for NG tube placement under fluoroscopy where lactulose can be administered. -Interventional radiology placed NG tube on 10/29/2015. They reported  unable to get past distal esophagus due to patient's distal esophageal anastomosis. Instructed nursing staff to see if she can tolerate administration of lactulose at a very slow/cautious rate. Unfortunately nursing staff reporting concerns for aspiration with administration of lactulose. -On 10/29/2015 Due to concerns for aspiration will discontinue lactulose through NG tube and resume lactulose enemas 3 times a day. -Has failed to show clinical improvement  Sepsis (HCC)/ Leukocytosis: PNA. UTI ?  -Chest x-ray performed on 10/26/2015 that showed right lower lobe infiltrate suspicious for pneumonia/aspiration. UA shows too many to count white blood cells positive for nitrates and bacteria. -She had been on broad-spectrum IV antimicrobial therapy with vancomycin and Zosyn  -Microbiology reporting gram variable coccobacillus drawing from one out of 2 blood cultures -Plan continue Zosyn IV -Nursing staff reporting concerns for aspiration after administration of lactulose  Alcohol abuse Monitor with CIWA, but due to her sedation will try to avoid benzodiazepines.  Medical goals of care Unfortunately Jennifer [name] has not shown any improvement in the last 72 hours. She remains minimally responsive. Or this weekend NG tube placement fluoroscopy was attempted, could not advance beyond anastomosis site at distal esophagus. We attempted to administer lactulose however she had signs of aspiration, thus restarted lactulose enemas. Lab work on 10/30/2015 showing an upward trend in her white count now 42,600. Transaminases also trending up. Prognosis poor. Family members spoke with Dr. Neale Burly on the morning of 10/30/2015 expressing their wishes for her to be comfort care. IV antibiotics, IV fluids, lactulose enemas have all been stopped. Will transfer to MedSurg.    Code Status: Full Code.  Family Communication:  Disposition Plan: Transition to comfort care.   Consultants:  GI  Palliative care    Procedures:  [place]  Antibiotics:  Vancomycin   Zosyn   HPI/Subjective: Patient is lethargic, no  responsive. She is not following command.   Objective: Filed Vitals:   10/30/15 1200 10/30/15 1300  BP: 146/65 129/69  Pulse: 65 52  Temp: 97.5 F (36.4 C)   Resp: 17 17    Intake/Output Summary (Last 24 hours) at 10/30/15 1358 Last data filed at 10/30/15 1200  Gross per 24 hour  Intake   2850 ml  Output   2226 ml  Net    624 ml   Filed Weights   10/26/15 2300  Weight: 84.3 kg (185 lb 13.6 oz)    Exam:   General:  Lethargic, Unresponsive, significant jaundice  Cardiovascular: S 1, S 2 RRR  Respiratory: Crackle bilateral.   Abdomen: Bs present, soft mild distended  Musculoskeletal: 3+ bilateral edema  Data Reviewed: Basic Metabolic Panel:  Recent Labs Lab 10/26/15 1006 10/27/15 0001 10/28/15 0311 10/29/15 0321 10/30/15 0823  NA 141 143 148* 137 137  K 4.0 3.8 3.5 3.8 3.5  CL 104 108 115* 108 105  CO2 25 21* 22 18* 18*  GLUCOSE 115* 110* 155* 171* 160*  BUN CREATININE 1.02* 0.66 0.81 0.48 <0.30*  CALCIUM 8.9 7.6* 7.8* 7.8* 8.0*  PHOS  --  3.2  --   --   --    Liver Function Tests:  Recent Labs Lab 10/26/15 1006 10/27/15 0001 10/28/15 0311 10/29/15 0321 10/30/15 0823  AST 98* 71* 88* 147* 148*  ALT 77* 50 58* 87* 102*  ALKPHOS 147* 102 102 107 122  BILITOT 26.5* 18.9* 20.3* 21.6* 21.8*  PROT 7.0 5.2* 5.3* 5.4* 5.6*  ALBUMIN 3.0* 2.1* 2.1* 2.0* 1.9*    Recent Labs Lab 10/26/15 1006  LIPASE 22    Recent Labs Lab 10/26/15 1013 10/28/15 0311 10/29/15 0322 10/30/15 0823  AMMONIA 100* 163* 143* 120*   CBC:  Recent Labs Lab 10/26/15 1006 10/26/15 2357 10/29/15 0356 10/30/15 0823  WBC 21.0* 16.7* 19.5* 42.6*  NEUTROABS 17.5* 14.1*  --   --   HGB 13.3 10.8* 12.0 12.6  HCT 37.8 31.0* 32.9* 34.1*  MCV 90.6 90.1 87.7 87.7  PLT 331 247 186 194   Cardiac Enzymes: No results for input(s): CKTOTAL, CKMB, CKMBINDEX,  TROPONINI in the last 168 hours. BNP (last 3 results)  Recent Labs  09/18/15 1454  BNP 126.0*    ProBNP (last 3 results) No results for input(s): PROBNP in the last 8760 hours.  CBG:  Recent Labs Lab 10/26/15 0919 10/26/15 1548  GLUCAP 102* 92    Recent Results (from the past 240 hour(s))  Urine culture     Status: Abnormal   Collection Time: 10/26/15  9:29 AM  Result Value Ref Range Status   Specimen Description URINE, CATHETERIZED  Final   Special Requests NONE  Final   Culture >=100,000 COLONIES/mL PROTEUS MIRABILIS (A)  Final   Report Status 10/28/2015 FINAL  Final   Organism ID, Bacteria PROTEUS MIRABILIS (A)  Final      Susceptibility   Proteus mirabilis - MIC*    AMPICILLIN <=2 SENSITIVE Sensitive     CEFAZOLIN <=4 SENSITIVE Sensitive     CEFTRIAXONE <=1 SENSITIVE Sensitive     CIPROFLOXACIN <=0.25 SENSITIVE Sensitive     GENTAMICIN <=1 SENSITIVE Sensitive     IMIPENEM 4 SENSITIVE Sensitive     NITROFURANTOIN 128 RESISTANT Resistant     TRIMETH/SULFA <=20 SENSITIVE Sensitive     AMPICILLIN/SULBACTAM <=2 SENSITIVE Sensitive     PIP/TAZO <=4 SENSITIVE Sensitive     * >=  100,000 COLONIES/mL PROTEUS MIRABILIS  Blood culture (routine x 2)     Status: None (Preliminary result)   Collection Time: 10/26/15 10:09 AM  Result Value Ref Range Status   Specimen Description BLOOD RIGHT ANTECUBITAL  Final   Special Requests BOTTLES DRAWN AEROBIC AND ANAEROBIC 5CC  Final   Culture  Setup Time   Final    GRAM VARIABLE COCCOBACILLUS ANAEROBIC BOTTLE ONLY CRITICAL RESULT CALLED TO, READ BACK BY AND VERIFIED WITH: Jimmie Molly AT 1610 ON [id number] BY Lucienne Capers    Culture   Final    CULTURE REINCUBATED FOR BETTER GROWTH Performed at [facility]    Report Status PENDING  Incomplete  Blood culture (routine x 2)     Status: None (Preliminary result)   Collection Time: 10/26/15 10:10 AM  Result Value Ref Range Status   Specimen Description BLOOD RIGHT ARM  Final    Special Requests BOTTLES DRAWN AEROBIC ONLY 6CC  Final   Culture   Final    NO GROWTH 3 DAYS Performed at [facility]    Report Status PENDING  Incomplete  MRSA PCR Screening     Status: Abnormal   Collection Time: 10/27/15  1:00 AM  Result Value Ref Range Status   MRSA by PCR POSITIVE (A) NEGATIVE Final    Comment:        The GeneXpert MRSA Assay (FDA approved for NASAL specimens only), is one component of a comprehensive MRSA colonization surveillance program. It is not intended to diagnose MRSA infection nor to guide or monitor treatment for MRSA infections. RESULT CALLED TO, READ BACK BY AND VERIFIED WITH: AWebb Silversmith RN AT [phone] ON 04.14.17 BY SHUEA      Studies: Dg Chest 1 View  10/29/2015  CLINICAL DATA:  45 year old female with history of pneumonia. EXAM: CHEST 1 VIEW COMPARISON:  Chest x-ray 10/26/2015. FINDINGS: Interval placement of an endotracheal T8, interval placement of a nasogastric tube, the tip of which appears to be distal esophagus have or shortly above the gastroesophageal junction. Increasing moderate right-sided pleural effusion with extensive atelectasis and/or consolidation in the lung bases bilaterally (right greater than left). Trace left pleural effusion. No evidence of pulmonary edema. No pneumothorax. Heart size is within normal limits. The patient is rotated to the right on today's exam, resulting in distortion of the mediastinal contours and reduced diagnostic sensitivity and specificity for mediastinal pathology. IMPRESSION: 1. Tip of nasogastric tube is in the distal esophagus at or immediately before the gastroesophageal junction, and could be advanced approximately 10 cm for more optimal placement. 2. Low lung volumes with worsening bibasilar (right greater than left) areas of atelectasis and/or airspace consolidation, with trace left and moderate right-sided pleural effusions which appear to be increasing. Electronically Signed   By: Trudie Reed M.D.   On: 10/29/2015 15:55   Dg Basil Dess Tube Plc W/fl W/rad  10/28/2015  CLINICAL DATA:  Patient for NG tube placement. EXAM: NASO G TUBE PLACEMENT WITH FL AND WITH RAD FLUOROSCOPY TIME:  Fluoroscopy Time (in minutes and seconds): 5 minutes 15 seconds. COMPARISON:  CT abdomen pelvis 09/01/2015 FINDINGS: Feeding tube was inserted under fluoroscopic guidance. The tube and side-port were not able to be passed into the stomach given the patient's distal esophageal anastomosis. The tube was left in place. IMPRESSION: Attempted NG tube placement with the tip and side-port in the distal esophagus. These results were called by telephone at the time of interpretation on 10/28/2015 at 3:00 pm to Dr.  Erricka Falkner , who verbally acknowledged these results. Electronically Signed   By: Drew  Davis M.D.   On: 10/28/2015 15:36    SchedulAnnia Belted Meds: . antiseptic oral rinse  7 mL Mouth Rinse q12n4p  . chlorhexidine  15 mL Mouth Rinse BID  . sodium chloride flush  3 mL Intravenous Q12H  . sodium chloride flush  3 mL Intravenous Q12H   Continuous Infusions:    Active Problems:   Alcohol abuse   Elevated bilirubin   Thrombocytopenia (HCC)   Hepatic encephalopathy (HCC)   Leukocytosis   Sepsis (HCC)   Decompensated liver disease (HCC)   Palliative care encounter   Goals of care, counseling/discussion   HCAP (healthcare-associated pneumonia)    Time spent: 25 minutes.     Jeralyn BennettZAMORA, Keyaira Clapham  [facility] Pager [phone]. If 7PM-7AM, please contact night-coverage at [email or web address], password [email or web address] 10/30/2015, 1:58 PM  LOS: 4 days

## 2015-10-30 NOTE — Progress Notes (Signed)
I met with patient's family this morning.  We discussed the fact that she is not improving as well as pathways moving forward.  Plan for transition to comfort care.  Full note to follow.  Code status changed to DNR.  Orders updated via comfort care order set.   , MD [facility] [phone]  

## 2015-10-31 ENCOUNTER — Other Ambulatory Visit: Payer: Self-pay

## 2015-10-31 DIAGNOSIS — R945 Abnormal results of liver function studies: Secondary | ICD-10-CM

## 2015-10-31 DIAGNOSIS — R7989 Other specified abnormal findings of blood chemistry: Secondary | ICD-10-CM

## 2015-10-31 LAB — CULTURE, BLOOD (ROUTINE X 2): Culture: NO GROWTH

## 2015-10-31 NOTE — Progress Notes (Signed)
Consultation Note Date: 10/31/2015   Patient Name: Jennifer [name]  DOB: [birth year]  MRN: [id number]  Age / Sex: 45 y.o., female  PCP: Jennifer [name] Referring Physician: Kelvin Cellar, MD  Reason for Consultation: Establishing goals of care  Clinical Assessment/Narrative:  I met today with patient's family. Her mother Jennifer [name] # [phone]), father, were present.    -continued conversation regarding diagnosis prognosis, Jennifer [name], EOL wishes disposition and options.  Focus is full comfort, hope is for hospice facility, will make refferal  - Values and goals of care important to patient and family were attempted to be elicited.  - detailed  Hospice benefit  Natural trajectory and expectations at EOL were discussed.  Questions and concerns addressed.  Hard Choices booklet left for review. Family encouraged to call with questions or concerns.  PMT will continue to support holistically.   Her family understands that she is dying.    Code Status/Advance Care Planning: DNR    Code Status Orders        Start     Ordered   10/26/15 2128  Full code   Continuous     10/26/15 2128    Code Status History    Date Active Date Inactive Code Status Order ID Comments User Context   09/28/2015  9:30 PM 10/17/2015  8:33 PM Full Code [id number]  Quintella Baton, MD Inpatient   09/01/2015  5:54 PM 09/05/2015  6:55 PM Full Code [id number]  Truett Mainland, DO Inpatient   10/15/2014  1:15 AM 10/18/2014  6:16 PM Full Code [id number]  Orvan Falconer, MD Inpatient   01/23/2014  3:37 AM 01/25/2014  9:45 PM Full Code [id number]  Oswald Hillock, MD Inpatient      Other Directives:None  Symptom Management:   Orders updated via EOL order set.  Palliative Prophylaxis:   Aspiration, Bowel Regimen and Frequent Pain Assessment  Psycho-social/Spiritual:  Support System: Fair   Prognosis: Hours - Days  Discharge  Planning:   Hospice facility   Chief Complaint/ Primary Diagnoses: Present on Admission:  . Alcohol abuse . Elevated bilirubin . Sepsis ([place]) . Thrombocytopenia ([place])  I have reviewed the medical record, interviewed the patient and family, and examined the patient. The following aspects are pertinent.  Past Medical History  Diagnosis Date  . Gastric bypass status for obesity   . Pancreatitis   . Depression   . Alcohol abuse   . Shortness of breath dyspnea   . GERD (gastroesophageal reflux disease)   . Gastric erosion 10/12/2015  . Acute blood loss anemia 09/30/2015  . 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 Alcoholic hepatitis without ascites    Social History   Social History  . Marital Status: Divorced    Spouse Name: N/A  . Number of Children: N/A  . Years of Education: N/A   Social History Main Topics  . Smoking status: Current Every Day Smoker -- 0.50 packs/day    Types: Cigarettes  . Smokeless tobacco: None  . Alcohol Use: Yes     Comment: daily,  vodka  1 pint per day, last time drank alcohol  march 20th 2017  . Drug Use: No  . Sexual Activity: Yes    Birth Control/ Protection: None   Other Topics Concern  . 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   History reviewed. No pertinent family history. Scheduled Meds: . antiseptic oral rinse  7 mL  Mouth Rinse q12n4p  . chlorhexidine  15 mL Mouth Rinse BID  . sodium chloride flush  3 mL Intravenous Q12H  . sodium chloride flush  3 mL Intravenous Q12H   Continuous Infusions:   PRN Meds:.sodium chloride, acetaminophen **OR** acetaminophen, bisacodyl, glycopyrrolate **OR** glycopyrrolate **OR** glycopyrrolate, haloperidol **OR** haloperidol **OR** haloperidol lactate, LORazepam **OR** LORazepam **OR** LORazepam, morphine injection, ondansetron **OR** ondansetron (ZOFRAN) IV, sodium chloride flush Medications Prior to Admission:  Prior to Admission  medications   Medication Sig Start Date End Date Taking? Authorizing Provider  camphor-menthol [facility]) lotion Apply topically as needed for itching. 10/17/15  Yes Rexene Alberts, MD  fluconazole (DIFLUCAN) 100 MG tablet Take 1 tablet (100 mg total) by mouth daily. For yeast skin infection. 10/17/15  Yes Rexene Alberts, MD  hydrOXYzine (ATARAX/VISTARIL) 25 MG tablet Take 1 tablet (25 mg total) by mouth 3 (three) times daily as needed for itching. 10/17/15  Yes Rexene Alberts, MD  lactulose (CHRONULAC) 10 GM/15ML solution Take 15 mLs (10 g total) by mouth daily. Medication to keep your ammonia level down. It is also a laxative. 10/17/15  Yes Rexene Alberts, MD  midodrine (PROAMATINE) 5 MG tablet Take 1 tablet (5 mg total) by mouth 2 (two) times daily with a meal. Medication to keep your blood pressure from dropping too low. 10/17/15  Yes Rexene Alberts, MD  Multiple Vitamin (MULTIVITAMIN WITH MINERALS) TABS tablet Take 1 tablet by mouth daily. 10/18/14  Yes Lezlie Octave Black, NP  pantoprazole (PROTONIX) 40 MG tablet Take 1 tablet (40 mg total) by mouth 2 (two) times daily before a meal. For your stomach ulcer. 10/17/15  Yes Rexene Alberts, MD  prednisoLONE (ORAPRED) 15 MG/5ML solution Take 10 mLs (30 mg total) by mouth daily before breakfast. Medication for your liver. 10/17/15  Yes Rexene Alberts, MD  thiamine 100 MG tablet Take 1 tablet (100 mg total) by mouth daily. 10/17/15  Yes Rexene Alberts, MD  nystatin ointment (MYCOSTATIN) Apply topically 2 (two) times daily. For skin yeast infection. Patient not taking: Reported on 10/26/2015 10/17/15   Rexene Alberts, MD  sucralfate (CARAFATE) 1 GM/10ML suspension Take 10 mLs (1 g total) by mouth 4 (four) times daily -  with meals and at bedtime. For your stomach ulcer. Patient not taking: Reported on 10/26/2015 10/17/15   Rexene Alberts, MD   Allergies  Allergen Reactions  . Aspirin Other (See Comments)    Cant take due to gastric bypass     Review of Systems  Unable to  obtain  Physical Exam   General: Severely jaundiced, not responsive to verbal or tactile stimulation  Cardiovascular: S 1, S 2 RRR  Respiratory: Crackles bilaterally.   Abdomen: Bs present, soft mild distended  Musculoskeletal: bilateral edema  Vital Signs: BP 105/67 mmHg  Pulse 65  Temp(Src) 95.9 F (35.5 C) (Axillary)  Resp 17  Ht 5' 8"  (1.727 m)  Wt 84.3 kg (185 lb 13.6 oz)  BMI 28.26 kg/m2  SpO2 95%  LMP   SpO2: SpO2: 95 % O2 Device:SpO2: 95 % O2 Flow Rate: .O2 Flow Rate (L/min): 3 L/min  IO: Intake/output summary:   Intake/Output Summary (Last 24 hours) at 10/31/15 1042 Last data filed at 10/31/15 0759  Gross per 24 hour  Intake      0 ml  Output    100 ml  Net   -100 ml    LBM: Last BM Date: 10/30/15 Baseline Weight: Weight: 84.3 kg (185 lb 13.6 oz) Most recent weight: Weight: 84.3 kg (185  lb 13.6 oz)      Palliative Assessment/Data:  Flowsheet Rows        Most Recent Value   Intake Tab    Referral Department  Hospitalist   Unit at Time of Referral  ICU   Palliative Care Primary Diagnosis  Sepsis/Infectious Disease   Date Notified  10/27/15   Palliative Care Type  New Palliative care   Date of Admission  10/26/15   Date first seen by Palliative Care  10/27/15   # of days Palliative referral response time  0 Day(s)   # of days IP prior to Palliative referral  1   Clinical Assessment    Palliative Performance Scale Score  10%   Pain Max last 24 hours  Not able to report   Pain Min Last 24 hours  Not able to report   Psychosocial & Spiritual Assessment    Palliative Care Outcomes    Patient/Family meeting held?  Yes   Who was at the meeting?  Parents, fianc, one of her 3 adult children   Palliative Care Outcomes  Clarified goals of care, ACP counseling assistance      Additional Data Reviewed:  CBC:    Component Value Date/Time   WBC 42.6* 10/30/2015 0823   HGB 12.6 10/30/2015 0823   HCT 34.1* 10/30/2015 0823   PLT 194 10/30/2015 0823    MCV 87.7 10/30/2015 0823   NEUTROABS 14.1* 10/26/2015 2357   LYMPHSABS 1.4 10/26/2015 2357   MONOABS 1.1* 10/26/2015 2357   EOSABS 0.0 10/26/2015 2357   BASOSABS 0.0 10/26/2015 2357   Comprehensive Metabolic Panel:    Component Value Date/Time   NA 137 10/30/2015 0823   K 3.5 10/30/2015 0823   CL 105 10/30/2015 0823   CO2 18* 10/30/2015 0823   BUN 6 10/30/2015 0823   CREATININE <0.30* 10/30/2015 0823   CREATININE 1.20* 10/24/2015 1211   GLUCOSE 160* 10/30/2015 0823   CALCIUM 8.0* 10/30/2015 0823   AST 148* 10/30/2015 0823   ALT 102* 10/30/2015 0823   ALKPHOS 122 10/30/2015 0823   BILITOT 21.8* 10/30/2015 0823   PROT 5.6* 10/30/2015 0823   ALBUMIN 1.9* 10/30/2015 0823     Time In: 1100 Time Out: 1125  Time Total: 25 min Greater than 50%  of this time was spent counseling and coordinating care related to the above assessment and plan.  Signed by: Wadie Lessen, NP  Knox Royalty, NP  10/31/2015, 10:42 AM  Please contact Palliative Medicine Team phone at [phone] for questions and concerns.

## 2015-10-31 NOTE — Consult Note (Addendum)
CSW consult for hospice placement. [facility] family[facility]. [facility] to [place] [facility]. Dellie BurnsJosie Tonnya Garbett, MSW, LCSW

## 2015-10-31 NOTE — Progress Notes (Signed)
Consultation Note Date: 10/31/2015   Patient Name: Jennifer [name]  DOB: 06/15/1971  MRN: [id number]  Age / Sex: 45 y.o., female  PCP: [place] Referring Physician: Kelvin Cellar, MD  Reason for Consultation: Establishing goals of care  Clinical Assessment/Narrative: I met again today with patient's family. Her mother, father, fiance, daughter, and friend were present.  We also called her son via phone.  We reviewed her clinical situation as well as the fact that she has made no meaningful recovery over the weekend.  We talked at length about pathways moving forward, including focus on comfort.  SUMMARY OF RECOMMENDATIONS - Met with multiple family members today.  We also talked to her son, [name] via phone. They understand the extent and seriousness of her condition. - Updated code status to DNR. - Her family understands that she is dying.  Plan moving forward is for comfort measures only. - Orders updated via end of life order set.  Code Status/Advance Care Planning: DNR    Code Status Orders        Start     Ordered   10/26/15 2128  Full code   Continuous     10/26/15 2128    Code Status History    Date Active Date Inactive Code Status Order ID Comments User Context   09/28/2015  9:30 PM 10/17/2015  8:33 PM Full Code [id number]  Quintella Baton, MD Inpatient   09/01/2015  5:54 PM 09/05/2015  6:55 PM Full Code [id number]  Truett Mainland, DO Inpatient   10/15/2014  1:15 AM 10/18/2014  6:16 PM Full Code [id number]  Orvan Falconer, MD Inpatient   01/23/2014  3:37 AM 01/25/2014  9:45 PM Full Code [id number]  Oswald Hillock, MD Inpatient      Other Directives:None  Symptom Management:   Orders updated via EOL order set.  Palliative Prophylaxis:   Aspiration, Bowel Regimen and Frequent Pain Assessment  Psycho-social/Spiritual:  Support System: Fair   Prognosis: Hours - Days  Discharge Planning:  To be determined.  This will likely be a terminal admission.  If her condition were to stabilize, may be able to consider transition to residential hospice if placement can be arranged as charity care.  Residential hospice would be the only appropriate disposition from the hospital.   Chief Complaint/ Primary Diagnoses: Present on Admission:  . Alcohol abuse . Elevated bilirubin . Sepsis ([place]) . Thrombocytopenia ([place])  I have reviewed the medical record, interviewed the patient and family, and examined the patient. The following aspects are pertinent.  Past Medical History  Diagnosis Date  . Gastric bypass status for obesity   . Pancreatitis   . Depression   . Alcohol abuse   . Shortness of breath dyspnea   . GERD (gastroesophageal reflux disease)   . Gastric erosion 10/12/2015  . Acute blood loss anemia 09/30/2015  . 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 Alcoholic hepatitis without ascites    Social History   Social History  . Marital Status: Divorced    Spouse Name: N/A  . Number of Children: N/A  . Years of Education: N/A   Social History Main Topics  . Smoking status: Current Every Day Smoker -- 0.50 packs/day    Types: Cigarettes  . Smokeless tobacco: None  . Alcohol Use: Yes     Comment: daily,  vodka  1 pint per day, last time drank alcohol  march 20th 2017  . Drug Use: No  . Sexual Activity: Yes  Birth Control/ Protection: None   Other Topics Concern  . 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   History reviewed. No pertinent family history. Scheduled Meds: . antiseptic oral rinse  7 mL Mouth Rinse q12n4p  . chlorhexidine  15 mL Mouth Rinse BID  . sodium chloride flush  3 mL Intravenous Q12H  . sodium chloride flush  3 mL Intravenous Q12H   Continuous Infusions:   PRN Meds:.sodium chloride, acetaminophen **OR** acetaminophen, bisacodyl, glycopyrrolate **OR** glycopyrrolate **OR**  glycopyrrolate, haloperidol **OR** haloperidol **OR** haloperidol lactate, LORazepam **OR** LORazepam **OR** LORazepam, morphine injection, ondansetron **OR** ondansetron (ZOFRAN) IV, sodium chloride flush Medications Prior to Admission:  Prior to Admission medications   Medication Sig Start Date End Date Taking? Authorizing Provider  camphor-menthol [facility]) lotion Apply topically as needed for itching. 10/17/15  Yes Rexene Alberts, MD  fluconazole (DIFLUCAN) 100 MG tablet Take 1 tablet (100 mg total) by mouth daily. For yeast skin infection. 10/17/15  Yes Rexene Alberts, MD  hydrOXYzine (ATARAX/VISTARIL) 25 MG tablet Take 1 tablet (25 mg total) by mouth 3 (three) times daily as needed for itching. 10/17/15  Yes Rexene Alberts, MD  lactulose (CHRONULAC) 10 GM/15ML solution Take 15 mLs (10 g total) by mouth daily. Medication to keep your ammonia level down. It is also a laxative. 10/17/15  Yes Rexene Alberts, MD  midodrine (PROAMATINE) 5 MG tablet Take 1 tablet (5 mg total) by mouth 2 (two) times daily with a meal. Medication to keep your blood pressure from dropping too low. 10/17/15  Yes Rexene Alberts, MD  Multiple Vitamin (MULTIVITAMIN WITH MINERALS) TABS tablet Take 1 tablet by mouth daily. 10/18/14  Yes Lezlie Octave Black, NP  pantoprazole (PROTONIX) 40 MG tablet Take 1 tablet (40 mg total) by mouth 2 (two) times daily before a meal. For your stomach ulcer. 10/17/15  Yes Rexene Alberts, MD  prednisoLONE (ORAPRED) 15 MG/5ML solution Take 10 mLs (30 mg total) by mouth daily before breakfast. Medication for your liver. 10/17/15  Yes Rexene Alberts, MD  thiamine 100 MG tablet Take 1 tablet (100 mg total) by mouth daily. 10/17/15  Yes Rexene Alberts, MD  nystatin ointment (MYCOSTATIN) Apply topically 2 (two) times daily. For skin yeast infection. Patient not taking: Reported on 10/26/2015 10/17/15   Rexene Alberts, MD  sucralfate (CARAFATE) 1 GM/10ML suspension Take 10 mLs (1 g total) by mouth 4 (four) times daily -  with meals and at  bedtime. For your stomach ulcer. Patient not taking: Reported on 10/26/2015 10/17/15   Rexene Alberts, MD   Allergies  Allergen Reactions  . Aspirin Other (See Comments)    Cant take due to gastric bypass     Review of Systems  Unable to obtain  Physical Exam   General: Severely jaundiced, not responsive to verbal or tactile stimulation  Cardiovascular: S 1, S 2 RRR  Respiratory: Crackles bilaterally.   Abdomen: Bs present, soft mild distended  Musculoskeletal: bilateral edema  Vital Signs: BP 105/67 mmHg  Pulse 65  Temp(Src) 95.9 F (35.5 C) (Axillary)  Resp 17  Ht 5' 8"  (1.727 m)  Wt 84.3 kg (185 lb 13.6 oz)  BMI 28.26 kg/m2  SpO2 95%  LMP   SpO2: SpO2: 95 % O2 Device:SpO2: 95 % O2 Flow Rate: .O2 Flow Rate (L/min): 3 L/min  IO: Intake/output summary:   Intake/Output Summary (Last 24 hours) at 10/31/15 9030 Last data filed at 10/30/15 1200  Gross  per 24 hour  Intake    675 ml  Output    301 ml  Net    374 ml    LBM: Last BM Date: 10/30/15 Baseline Weight: Weight: 84.3 kg (185 lb 13.6 oz) Most recent weight: Weight: 84.3 kg (185 lb 13.6 oz)      Palliative Assessment/Data:  Flowsheet Rows        Most Recent Value   Intake Tab    Referral Department  Hospitalist   Unit at Time of Referral  ICU   Palliative Care Primary Diagnosis  Sepsis/Infectious Disease   Date Notified  10/27/15   Palliative Care Type  New Palliative care   Date of Admission  10/26/15   Date first seen by Palliative Care  10/27/15   # of days Palliative referral response time  0 Day(s)   # of days IP prior to Palliative referral  1   Clinical Assessment    Palliative Performance Scale Score  10%   Pain Max last 24 hours  Not able to report   Pain Min Last 24 hours  Not able to report   Psychosocial & Spiritual Assessment    Palliative Care Outcomes    Patient/Family meeting held?  Yes   Who was at the meeting?  Parents, fianc, one of her 3 adult children   Palliative Care  Outcomes  Clarified goals of care, ACP counseling assistance      Additional Data Reviewed:  CBC:    Component Value Date/Time   WBC 42.6* 10/30/2015 0823   HGB 12.6 10/30/2015 0823   HCT 34.1* 10/30/2015 0823   PLT 194 10/30/2015 0823   MCV 87.7 10/30/2015 0823   NEUTROABS 14.1* 10/26/2015 2357   LYMPHSABS 1.4 10/26/2015 2357   MONOABS 1.1* 10/26/2015 2357   EOSABS 0.0 10/26/2015 2357   BASOSABS 0.0 10/26/2015 2357   Comprehensive Metabolic Panel:    Component Value Date/Time   NA 137 10/30/2015 0823   K 3.5 10/30/2015 0823   CL 105 10/30/2015 0823   CO2 18* 10/30/2015 0823   BUN 6 10/30/2015 0823   CREATININE <0.30* 10/30/2015 0823   CREATININE 1.20* 10/24/2015 1211   GLUCOSE 160* 10/30/2015 0823   CALCIUM 8.0* 10/30/2015 0823   AST 148* 10/30/2015 0823   ALT 102* 10/30/2015 0823   ALKPHOS 122 10/30/2015 0823   BILITOT 21.8* 10/30/2015 0823   PROT 5.6* 10/30/2015 0823   ALBUMIN 1.9* 10/30/2015 0823     Time In: 0900 Time Out: 0950 Time Total: 50 Greater than 50%  of this time was spent counseling and coordinating care related to the above assessment and plan.  Signed by: Micheline Rough, MD  Micheline Rough, MD  10/31/2015, 6:23 AM  Please contact Palliative Medicine Team phone at [phone] for questions and concerns.

## 2015-10-31 NOTE — Progress Notes (Signed)
Quick Note:  Tried to call pt. Many rings and no answer. Lab order entered and released and [facility]. ______

## 2015-10-31 NOTE — Progress Notes (Signed)
[facility] PROGRESS NOTE  Jennifer [name] ZOX:[id number] DOB: 1971/06/19 DOA: 10/26/2015 PCP: [facility]  Assessment/Plan: Jennifer [name] is a 45 y.o. female past medical history of tobacco abuse, alcohol, history of decompensated liver cirrhosis ,pancreatitis chronic lower extremity edema recently discharged from [facility] for lower extremity cellulitis that is brought in for altered mental status, per report patient has been lethargic for last 5 days, and no responsive since day of admission. On 10/30/2015 she was transitioned to comfort care. Her parents and 2 adult children involved in her care. Currently plan is to transition to inpatient hospice, awaiting bed availability.   Liver failure, cirrhosis. Acute decompensation  -Jennifer [name] having a history of alcoholic liver disease, admitted in the month of February for acute alcohol-induced hepatitis. [place]; Increased echotexture throughout the liver compatible with fatty infiltration. Small amount of perihepatic ascites. Gallbladder distention with gallbladder sludge. No wall thickening or sonographic Murphy sign. -GI consulted. -Labs showing a total bilirubin of 20.3 with an INR of 2.9 and PTT of 22. Her ammonia level was also elevated at 163, increased from 102 days ago. Calculated meld score at 25 -Case was discussed with GI, patient overall having a very poor prognosis. GI recommended starting Solu-Medrol 40 mg IV twice a day -On 10/30/2015 after reassessing Jennifer [name]'s clinical status, having minimal responsiveness, worsening transaminases, white count of 42,600, probable fulminant hepatic failure, family members expressed wishes to Dr. Neale Burly of palliative care to transition to full comfort measures. -Plan to transition to inpatient hospice, referrals sent, awaiting bed availability.  Acute encephalopathy; hepatic encephalopathy;  -She remains minimally responsive as lab work showed upward trend in ammonia  level at 168 from 100 2 days ago. -Case discussed with GI on 10/28/2015 who recommended consulting interventional radiology for NG tube placement under fluoroscopy where lactulose can be administered. -Interventional radiology placed NG tube on 10/29/2015. They reported unable to get past distal esophagus due to patient's distal esophageal anastomosis. Instructed nursing staff to see if she can tolerate administration of lactulose at a very slow/cautious rate. Unfortunately nursing staff reporting concerns for aspiration with administration of lactulose. -On 10/29/2015 Due to concerns for aspiration will discontinue lactulose through NG tube and resume lactulose enemas 3 times a day. -Has failed to show clinical improvement  Sepsis (HCC)/ Leukocytosis: PNA. UTI ?  -Chest x-ray performed on 10/26/2015 that showed right lower lobe infiltrate suspicious for pneumonia/aspiration. UA shows too many to count white blood cells positive for nitrates and bacteria. -She had been on broad-spectrum IV antimicrobial therapy with vancomycin and Zosyn  -Microbiology reporting gram variable coccobacillus drawing from one out of 2 blood cultures -Plan continue Zosyn IV -Nursing staff reporting concerns for aspiration after administration of lactulose  Alcohol abuse Monitor with [facility], but due to her sedation will try to avoid benzodiazepines.  Medical goals of care Unfortunately Jennifer [name] has not shown any improvement in the last 72 hours. She remains minimally responsive. Or this weekend NG tube placement fluoroscopy was attempted, could not advance beyond anastomosis site at distal esophagus. We attempted to administer lactulose however she had signs of aspiration, thus restarted lactulose enemas. Lab work on 10/30/2015 showing an upward trend in her white count now 42,600. Transaminases also trending up. Prognosis poor. Family members spoke with Dr. Neale Burly on the morning of 10/30/2015 expressing their wishes  for her to be comfort care. IV antibiotics, IV fluids, lactulose enemas have all been stopped. Transfered to MedSurg. Plan is for Jennifer [name]  to transition to inpatient hospice, referral sent, awaiting bed availability.   Code Status: Full Code.  Family Communication: I spoke to her parents at bedside on 10/31/2015 Disposition Plan:  Awaiting transfer to inpatient hospice   Consultants:  GI  Palliative care   Procedures:  [place]  Antibiotics:  Vancomycin stopped on 10/30/2015  Zosyn stopped on 10/30/2015  HPI/Subjective: Patient is lethargic, no responsive. She is not following command.   Objective: Filed Vitals:   10/30/15 2153 10/31/15 0612  BP: 121/72 105/67  Pulse: 72 65  Temp: 97.7 F (36.5 C) 95.9 F (35.5 C)  Resp: 18 17    Intake/Output Summary (Last 24 hours) at 10/31/15 1621 Last data filed at 10/31/15 0759  Gross per 24 hour  Intake      0 ml  Output      0 ml  Net      0 ml   Filed Weights   10/26/15 2300  Weight: 84.3 kg (185 lb 13.6 oz)    Exam:   General:  Lethargic, Unresponsive, significant jaundice  Cardiovascular: S 1, S 2 RRR  Respiratory: Crackle bilateral.   Abdomen: Bs present, soft mild distended  Musculoskeletal: 3+ bilateral edema  Data Reviewed: Basic Metabolic Panel:  Recent Labs Lab 10/26/15 1006 10/27/15 0001 10/28/15 0311 10/29/15 0321 10/30/15 0823  NA 141 143 148* 137 137  K 4.0 3.8 3.5 3.8 3.5  CL 104 108 115* 108 105  CO2 25 21* 22 18* 18*  GLUCOSE 115* 110* 155* 171* 160*  BUN CREATININE 1.02* 0.66 0.81 0.48 <0.30*  CALCIUM 8.9 7.6* 7.8* 7.8* 8.0*  PHOS  --  3.2  --   --   --    Liver Function Tests:  Recent Labs Lab 10/26/15 1006 10/27/15 0001 10/28/15 0311 10/29/15 0321 10/30/15 0823  AST 98* 71* 88* 147* 148*  ALT 77* 50 58* 87* 102*  ALKPHOS 147* 102 102 107 122  BILITOT 26.5* 18.9* 20.3* 21.6* 21.8*  PROT 7.0 5.2* 5.3* 5.4* 5.6*  ALBUMIN 3.0* 2.1* 2.1* 2.0* 1.9*     Recent Labs Lab 10/26/15 1006  LIPASE 22    Recent Labs Lab 10/26/15 1013 10/28/15 0311 10/29/15 0322 10/30/15 0823  AMMONIA 100* 163* 143* 120*   CBC:  Recent Labs Lab 10/26/15 1006 10/26/15 2357 10/29/15 0356 10/30/15 0823  WBC 21.0* 16.7* 19.5* 42.6*  NEUTROABS 17.5* 14.1*  --   --   HGB 13.3 10.8* 12.0 12.6  HCT 37.8 31.0* 32.9* 34.1*  MCV 90.6 90.1 87.7 87.7  PLT 331 247 186 194   Cardiac Enzymes: No results for input(s): CKTOTAL, CKMB, CKMBINDEX, TROPONINI in the last 168 hours. BNP (last 3 results)  Recent Labs  09/18/15 1454  BNP 126.0*    ProBNP (last 3 results) No results for input(s): PROBNP in the last 8760 hours.  CBG:  Recent Labs Lab 10/26/15 0919 10/26/15 1548  GLUCAP 102* 92    Recent Results (from the past 240 hour(s))  Urine culture     Status: Abnormal   Collection Time: 10/26/15  9:29 AM  Result Value Ref Range Status   Specimen Description URINE, CATHETERIZED  Final   Special Requests NONE  Final   Culture >=100,000 COLONIES/mL PROTEUS MIRABILIS (A)  Final   Report Status 10/28/2015 FINAL  Final   Organism ID, Bacteria PROTEUS MIRABILIS (A)  Final      Susceptibility   Proteus mirabilis - MIC*    AMPICILLIN <=2  SENSITIVE Sensitive     CEFAZOLIN <=4 SENSITIVE Sensitive     CEFTRIAXONE <=1 SENSITIVE Sensitive     CIPROFLOXACIN <=0.25 SENSITIVE Sensitive     GENTAMICIN <=1 SENSITIVE Sensitive     IMIPENEM 4 SENSITIVE Sensitive     NITROFURANTOIN 128 RESISTANT Resistant     TRIMETH/SULFA <=20 SENSITIVE Sensitive     AMPICILLIN/SULBACTAM <=2 SENSITIVE Sensitive     PIP/TAZO <=4 SENSITIVE Sensitive     * >=100,000 COLONIES/mL PROTEUS MIRABILIS  Blood culture (routine x 2)     Status: None (Preliminary result)   Collection Time: 10/26/15 10:09 AM  Result Value Ref Range Status   Specimen Description BLOOD RIGHT ANTECUBITAL  Final   Special Requests BOTTLES DRAWN AEROBIC AND ANAEROBIC 5CC  Final   Culture  Setup Time    Final    GRAM VARIABLE COCCOBACILLUS ANAEROBIC BOTTLE ONLY CRITICAL RESULT CALLED TO, READ BACK BY AND VERIFIED WITH: Jimmie MollyS. DILLON,RN AT [id number] ON [id number] BY Lucienne CapersS. YARBROUGH    Culture   Final    ANAEROBIC GRAM VARIABLE COCCOBACILLI CULTURE REINCUBATED FOR BETTER GROWTH [facility]    Report Status PENDING  Incomplete  Blood culture (routine x 2)     Status: None   Collection Time: 10/26/15 10:10 AM  Result Value Ref Range Status   Specimen Description BLOOD RIGHT ARM  Final   Special Requests BOTTLES DRAWN AEROBIC ONLY 6CC  Final   Culture   Final    NO GROWTH 5 DAYS [facility]    Report Status 10/31/2015 FINAL  Final  MRSA PCR Screening     Status: Abnormal   Collection Time: 10/27/15  1:00 AM  Result Value Ref Range Status   MRSA by PCR POSITIVE (A) NEGATIVE Final    Comment:        The GeneXpert MRSA Assay (FDA approved for NASAL specimens only), is one component of a comprehensive MRSA colonization surveillance program. It is not intended to diagnose MRSA infection nor to guide or monitor treatment for MRSA infections. RESULT CALLED TO, READ BACK BY AND VERIFIED WITH: A. WELCH RN AT 0535 ON 04.14.17 BY SHUEA      Studies: No results found.  Scheduled Meds: . antiseptic oral rinse  7 mL Mouth Rinse q12n4p  . chlorhexidine  15 mL Mouth Rinse BID  . sodium chloride flush  3 mL Intravenous Q12H  . sodium chloride flush  3 mL Intravenous Q12H   Continuous Infusions:    Active Problems:   Alcohol abuse   Elevated bilirubin   Thrombocytopenia (HCC)   Hepatic encephalopathy (HCC)   Leukocytosis   Sepsis (HCC)   Decompensated liver disease (HCC)   Palliative care encounter   Goals of care, counseling/discussion   HCAP (healthcare-associated pneumonia)    Time spent: 15 minutes.     Jeralyn BennettZAMORA, Jaydence Vanyo  [facility] Pager [phone]. If 7PM-7AM, please contact night-coverage at [email or web address], password  [email or web address] 10/31/2015, 4:21 PM  LOS: 5 days

## 2015-11-01 DIAGNOSIS — Z66 Do not resuscitate: Secondary | ICD-10-CM

## 2015-11-01 DIAGNOSIS — F1010 Alcohol abuse, uncomplicated: Secondary | ICD-10-CM

## 2015-11-01 DIAGNOSIS — K7469 Other cirrhosis of liver: Secondary | ICD-10-CM

## 2015-11-01 DIAGNOSIS — K7290 Hepatic failure, unspecified without coma: Principal | ICD-10-CM

## 2015-11-01 DIAGNOSIS — D696 Thrombocytopenia, unspecified: Secondary | ICD-10-CM

## 2015-11-01 DIAGNOSIS — J189 Pneumonia, unspecified organism: Secondary | ICD-10-CM

## 2015-11-01 NOTE — Progress Notes (Signed)
[facility] mother has completed all paperwork for admission and they are able to accept pt. DC summary faxed to facility and RN to call report. Will arrange [facility] for transport. CSW signing off at d/c.   Dellie BurnsJosie Antwoin Lackey, MSW, LCSW

## 2015-11-01 NOTE — Discharge Summary (Signed)
Physician Discharge Summary  Jennifer [name] OZH:[id number] DOB: 1970/11/19 DOA: 10/26/2015  PCP: [facility]  Admit date: 10/26/2015 Discharge date: 11/01/2015  Time spent: 45 minutes  Recommendations for Outpatient Follow-up:  1. Will go to hospice home  Discharge Condition: gaurded    Discharge Diagnoses:  Principal Problem:   Decompensated liver disease (HCC) Active Problems:   Alcohol abuse   Elevated bilirubin   Thrombocytopenia (HCC)   Hepatic encephalopathy (HCC)   Leukocytosis   Sepsis (HCC)   Palliative care encounter   Goals of care, counseling/discussion   HCAP (healthcare-associated pneumonia)   History of present illness:  Jennifer [name] is a 45 y.o. female past medical history of tobacco abuse, alcohol, history of decompensated liver cirrhosis ,pancreatitis chronic lower extremity edema recently discharged from [facility] for lower extremity cellulitis that is brought in for altered mental status, per report patient has been lethargic for last 5 days, and no responsive since day of admission. On 10/30/2015 she was transitioned to comfort care. Her parents and 2 adult children involved in her care. Currently plan is to transition to inpatient hospice, awaiting bed availability.  Hospital Course:  Liver failure, cirrhosis- Alcohol abuse -Jennifer [name] having a history of alcoholic liver disease, admitted in the month of February for acute alcohol-induced hepatitis. [place]; Increased echotexture throughout the liver compatible with fatty infiltration. Small amount of perihepatic ascites. Gallbladder distention with gallbladder sludge. No wall thickening or sonographic Murphy sign. -GI consulted. -Labs showing a total bilirubin of 20.3 with an INR of 2.9 and PTT of 22. Her ammonia level was also elevated at 163, increased from 102 days ago. Calculated meld score at 25 -Case was discussed with GI, patient overall having a very poor prognosis. GI recommended  starting Solu-Medrol 40 mg IV twice a day -On 10/30/2015 after reassessing Jennifer [name]'s clinical status, having minimal responsiveness, worsening transaminases, white count of 42,600, probable fulminant hepatic failure, family members expressed wishes to Dr. Neale Burly of palliative care to transition to full comfort measures.  Acute encephalopathy; hepatic encephalopathy;  -She remains minimally responsive as lab work showed upward trend in ammonia level at 168 from 100 2 days ago. -Case discussed with GI on 10/28/2015 who recommended consulting interventional radiology for NG tube placement under fluoroscopy where lactulose can be administered. -Interventional radiology placed NG tube on 10/29/2015. They reported unable to get past distal esophagus due to patient's distal esophageal anastomosis. Instructed nursing staff to see if she can tolerate administration of lactulose at a very slow/cautious rate. Unfortunately nursing staff reporting concerns for aspiration with administration of lactulose. -On 10/29/2015 Due to concerns for aspiration will discontinue lactulose through NG tube and resume lactulose enemas 3 times a day. -Has failed to show clinical improvement  Sepsis (HCC)/ Leukocytosis: PNA. UTI ?  -Chest x-ray performed on 10/26/2015 that showed right lower lobe infiltrate suspicious for pneumonia/aspiration. UA shows too many to count white blood cells positive for nitrates and bacteria. -She had been on broad-spectrum IV antimicrobial therapy with vancomycin and Zosyn  -Microbiology reporting gram variable coccobacillus drawing from one out of 2 blood cultures -Plan continue Zosyn IV -Nursing staff reporting concerns for aspiration after administration of lactulose  Medical goals of care Unfortunately Jennifer [name] has not shown any improvement in the last 72 hours. She remains minimally responsive. Or this weekend NG tube placement fluoroscopy was attempted, could not advance beyond  anastomosis site at distal esophagus. We attempted to administer lactulose however she had signs of  aspiration, thus restarted lactulose enemas. Lab work on 10/30/2015 showing an upward trend in her white count now 42,600. Transaminases also trending up. Prognosis poor. Family members spoke with Dr. Neale Burly on the morning of 10/30/2015 expressing their wishes for her to be comfort care. IV antibiotics, IV fluids, lactulose enemas have all been stopped.   Consultations: Consultants:  GI  Palliative care  Discharge Exam: Filed Weights   10/26/15 2300  Weight: 84.3 kg (185 lb 13.6 oz)   Filed Vitals:   10/31/15 0612 10/31/15 1312  BP: 105/67 124/73  Pulse: 65 70  Temp: 95.9 F (35.5 C) 97.8 F (36.6 C)  Resp: 17 16    General: Lethargic, severe jaundice Cardiovascular: RRR, no murmurs  Respiratory: clear to auscultation bilaterally GI: soft, non-tender, non-distended, bowel sound positive  Discharge Instructions You were cared for by a hospitalist during your hospital stay. If you have any questions about your discharge medications or the care you received while you were in the hospital after you are discharged, you can call the unit and asked to speak with the hospitalist on call if the hospitalist that took care of you is not available. Once you are discharged, your primary care physician will handle any further medical issues. Please note that NO REFILLS for any discharge medications will be authorized once you are discharged, as it is imperative that you return to your primary care physician (or establish a relationship with a primary care physician if you do not have one) for your aftercare needs so that they can reassess your need for medications and monitor your lab values.     Medication List    STOP taking these medications        fluconazole 100 MG tablet  Commonly known as:  DIFLUCAN     hydrOXYzine 25 MG tablet  Commonly known as:  ATARAX/VISTARIL     lactulose 10  GM/15ML solution  Commonly known as:  CHRONULAC     midodrine 5 MG tablet  Commonly known as:  PROAMATINE     multivitamin with minerals Tabs tablet     nystatin ointment  Commonly known as:  MYCOSTATIN     pantoprazole 40 MG tablet  Commonly known as:  PROTONIX     prednisoLONE 15 MG/5ML solution  Commonly known as:  ORAPRED     sucralfate 1 GM/10ML suspension  Commonly known as:  CARAFATE     thiamine 100 MG tablet      TAKE these medications        camphor-menthol lotion  Commonly known as:  SARNA  Apply topically as needed for itching.       Allergies  Allergen Reactions  . 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    Significant Diagnostic Studies: Dg Chest 1 View  10/29/2015  CLINICAL DATA:  45 year old female with history of pneumonia. EXAM: CHEST 1 VIEW COMPARISON:  Chest x-ray 10/26/2015. FINDINGS: Interval placement of an endotracheal T8, interval placement of a nasogastric tube, the tip of which appears to be distal esophagus have or shortly above the gastroesophageal junction. Increasing moderate right-sided pleural effusion with extensive atelectasis and/or consolidation in the lung bases bilaterally (right greater than left). Trace left pleural effusion. No evidence of pulmonary edema. No pneumothorax. Heart size is within normal limits. The patient is rotated to  the right on today's exam, resulting in distortion of the mediastinal contours and reduced diagnostic sensitivity and specificity for mediastinal pathology. IMPRESSION: 1. Tip of nasogastric tube is in the distal esophagus at or immediately before the gastroesophageal junction, and could be advanced approximately 10 cm for more optimal placement. 2. Low lung volumes with worsening bibasilar (right greater than left)  areas of atelectasis and/or airspace consolidation, with trace left and moderate right-sided pleural effusions which appear to be increasing. Electronically Signed   By: Trudie Reed M.D.   On: 10/29/2015 15:55   Dg Abd 1 View  10/27/2015  CLINICAL DATA:  NG tube placement. EXAM: ABDOMEN - 1 VIEW COMPARISON:  CT 09/01/2015. FINDINGS: NG tube noted. The tube appears to be seen in the hiatal hernia. Repositioning should be considered. Surgical clips sutures in the upper abdomen. No bowel distention. Bibasilar atelectasis and/or infiltrates. Right pleural effusion. IMPRESSION: 1. NG tube noted. The tip appears to be kinked and the patient's hiatal hernia. Repositioning should be considered. Surgical clips sutures in the upper abdomen. 2. Bibasilar atelectasis and/or infiltrates. Small right pleural effusion . Electronically Signed   By: Maisie Fus  Register   On: 10/27/2015 07:12   Ct Head Wo Contrast  10/26/2015  CLINICAL DATA:  Altered mental status EXAM: CT HEAD WITHOUT CONTRAST TECHNIQUE: Contiguous axial images were obtained from the base of the skull through the vertex without intravenous contrast. COMPARISON:  CT head 03/10/2013 FINDINGS: Mild atrophy given the patient's age.  Negative for hydrocephalus Negative for acute infarct. Negative for intracranial hemorrhage. No mass or edema Left parietal scalp soft tissue thickening has improved since the prior study most likely scarring related to prior infection or trauma. Calvarium intact. IMPRESSION: Mild atrophy.  No acute intracranial abnormality. Electronically Signed   By: Marlan Palau M.D.   On: 10/26/2015 11:51   Dg Chest Portable 1 View  10/26/2015  CLINICAL DATA:  Altered mental status. EXAM: PORTABLE CHEST 1 VIEW COMPARISON:  09/28/2015 FINDINGS: Hypoventilation with decreased lung volume. Elevation right hemidiaphragm again noted. Right lower lobe airspace disease has progressed and may represent pneumonia or atelectasis. Small right pleural  effusion. Mild left lower lobe atelectasis/ infiltrate.  Hiatal hernia. IMPRESSION: Hypoventilation with decreased lung volume Right lower lobe infiltrate suspicious for pneumonia. Small right pleural effusion. Mild left lower lobe atelectasis/ infiltrate also present Hiatal hernia. Electronically Signed   By: Marlan Palau M.D.   On: 10/26/2015 09:50   Dg Basil Dess Tube Plc W/fl W/rad  10/28/2015  CLINICAL DATA:  Patient for NG tube placement. EXAM: NASO G TUBE PLACEMENT WITH FL AND WITH RAD FLUOROSCOPY TIME:  Fluoroscopy Time (in minutes and seconds): 5 minutes 15 seconds. COMPARISON:  CT abdomen pelvis 09/01/2015 FINDINGS: Feeding tube was inserted under fluoroscopic guidance. The tube and side-port were not able to be passed into the stomach given the patient's distal esophageal anastomosis. The tube was left in place. IMPRESSION: Attempted NG tube placement with the tip and side-port in the distal esophagus. These results were called by telephone at the time of interpretation on 10/28/2015 at 3:00 pm to Dr. Jeralyn Bennett , who verbally acknowledged these results. Electronically Signed   By: Annia Belt M.D.   On: 10/28/2015 15:36   US Abdomen Limited Ruq  10/27/2015  CLINICAL DATA:  Abnormal LFTs EXAM: US ABDOMEN LIMITED - RIGHT UPPER QUADRANT COMPARISON:  CT and ultrasound 09/01/2015 FINDINGS: Gallbladder: Sludge noted within the gallbladder. Gallbladder is mildly distended. No wall thickening or sonographic Murphy sign.  Common bile duct: Diameter: Normal caliber, 6-7 mm Liver: Increased echotexture throughout the liver. Small amount of free fluid noted around the liver. No focal hepatic abnormality. IMPRESSION: Increased echotexture throughout the liver compatible with fatty infiltration. Small amount of perihepatic ascites. Gallbladder distention with gallbladder sludge. No wall thickening or sonographic Murphy sign. Electronically Signed   By: Charlett NoseKevin  Dover M.D.   On: 10/27/2015 08:51     Microbiology: Recent Results (from the past 240 hour(s))  Urine culture     Status: Abnormal   Collection Time: 10/26/15  9:29 AM  Result Value Ref Range Status   Specimen Description URINE, CATHETERIZED  Final   Special Requests NONE  Final   Culture >=100,000 COLONIES/mL PROTEUS MIRABILIS (A)  Final   Report Status 10/28/2015 FINAL  Final   Organism ID, Bacteria PROTEUS MIRABILIS (A)  Final      Susceptibility   Proteus mirabilis - MIC*    AMPICILLIN <=2 SENSITIVE Sensitive     CEFAZOLIN <=4 SENSITIVE Sensitive     CEFTRIAXONE <=1 SENSITIVE Sensitive     CIPROFLOXACIN <=0.25 SENSITIVE Sensitive     GENTAMICIN <=1 SENSITIVE Sensitive     IMIPENEM 4 SENSITIVE Sensitive     NITROFURANTOIN 128 RESISTANT Resistant     TRIMETH/SULFA <=20 SENSITIVE Sensitive     AMPICILLIN/SULBACTAM <=2 SENSITIVE Sensitive     PIP/TAZO <=4 SENSITIVE Sensitive     * >=100,000 COLONIES/mL PROTEUS MIRABILIS  Blood culture (routine x 2)     Status: None (Preliminary result)   Collection Time: 10/26/15 10:09 AM  Result Value Ref Range Status   Specimen Description BLOOD RIGHT ANTECUBITAL  Final   Special Requests BOTTLES DRAWN AEROBIC AND ANAEROBIC 5CC  Final   Culture  Setup Time   Final    GRAM VARIABLE COCCOBACILLUS ANAEROBIC BOTTLE ONLY CRITICAL RESULT CALLED TO, READ BACK BY AND VERIFIED WITH: Jimmie MollyS. DILLON,RN AT [id number] ON [id number] BY Lucienne CapersS. YARBROUGH    Culture   Final    ANAEROBIC GRAM VARIABLE COCCOBACILLI IDENTIFICATION TO FOLLOW Performed at [facility]    Report Status PENDING  Incomplete  Blood culture (routine x 2)     Status: None   Collection Time: 10/26/15 10:10 AM  Result Value Ref Range Status   Specimen Description BLOOD RIGHT ARM  Final   Special Requests BOTTLES DRAWN AEROBIC ONLY 6CC  Final   Culture   Final    NO GROWTH 5 DAYS Performed at [facility]     Report Status 10/31/2015 FINAL  Final  MRSA PCR Screening     Status: Abnormal   Collection Time: 10/27/15   1:00 AM  Result Value Ref Range Status   MRSA by PCR POSITIVE (A) NEGATIVE Final    Comment:        The GeneXpert MRSA Assay (FDA approved for NASAL specimens only), is one component of a comprehensive MRSA colonization surveillance program. It is not intended to diagnose MRSA infection nor to guide or monitor treatment for MRSA infections. RESULT CALLED TO, READ BACK BY AND VERIFIED WITH: AWebb Silversmith. WELCH RN AT 0535 ON 04.14.17 BY SHUEA      Labs: Basic Metabolic Panel:  Recent Labs Lab 10/26/15 1006 10/27/15 0001 10/28/15 0311 10/29/15 0321 10/30/15 0823  NA 141 143 148* 137 137  K 4.0 3.8 3.5 3.8 3.5  CL 104 108 115* 108 105  CO2 25 21* 22 18* 18*  GLUCOSE 115* 110* 155* 171* 160*  BUN 7 6 6 6 6   CREATININE  1.02* 0.66 0.81 0.48 <0.30*  CALCIUM 8.9 7.6* 7.8* 7.8* 8.0*  PHOS  --  3.2  --   --   --    Liver Function Tests:  Recent Labs Lab 10/26/15 1006 10/27/15 0001 10/28/15 0311 10/29/15 0321 10/30/15 0823  AST 98* 71* 88* 147* 148*  ALT 77* 50 58* 87* 102*  ALKPHOS 147* 102 102 107 122  BILITOT 26.5* 18.9* 20.3* 21.6* 21.8*  PROT 7.0 5.2* 5.3* 5.4* 5.6*  ALBUMIN 3.0* 2.1* 2.1* 2.0* 1.9*    Recent Labs Lab 10/26/15 1006  LIPASE 22    Recent Labs Lab 10/26/15 1013 10/28/15 0311 10/29/15 0322 10/30/15 0823  AMMONIA 100* 163* 143* 120*   CBC:  Recent Labs Lab 10/26/15 1006 10/26/15 2357 10/29/15 0356 10/30/15 0823  WBC 21.0* 16.7* 19.5* 42.6*  NEUTROABS 17.5* 14.1*  --   --   HGB 13.3 10.8* 12.0 12.6  HCT 37.8 31.0* 32.9* 34.1*  MCV 90.6 90.1 87.7 87.7  PLT 331 247 186 194   Cardiac Enzymes: No results for input(s): CKTOTAL, CKMB, CKMBINDEX, TROPONINI in the last 168 hours. BNP: BNP (last 3 results)  Recent Labs  09/18/15 1454  BNP 126.0*    ProBNP (last 3 results) No results for input(s): PROBNP in the last 8760 hours.  CBG:  Recent Labs Lab 10/26/15 0919 10/26/15 1548  GLUCAP 102* 92       Signed:  Calvert Cantor,  MD [facility] 11/01/2015, 10:08 AM

## 2015-11-01 NOTE — Progress Notes (Signed)
CSW spoke with [facility] at [facility] who reports they are able to offer pt a bed today. [name] to touch base with pt's family re paperwork. Will update MD.  Jennifer BurnsJosie Tylerjames [name], MSW, LCSW [phone] (weekend coverage)

## 2015-11-01 NOTE — Progress Notes (Signed)
Quick Note:  Called again, many rings and no answer. Mailing a letter to call to discuss and also enclosed the lab order to recheck LFT's now. ______

## 2015-11-02 LAB — CULTURE, BLOOD (ROUTINE X 2)

## 2015-11-08 DIAGNOSIS — Z66 Do not resuscitate: Secondary | ICD-10-CM | POA: Insufficient documentation

## 2015-11-13 DEATH — deceased

## 2015-12-04 ENCOUNTER — Ambulatory Visit: Payer: Self-pay | Admitting: Gastroenterology

## 2017-09-20 IMAGING — CT CT ABD-PELV W/ CM
2 of 5 series · 16 of 46 positions shown, 18 images · IV contrast (omnipaque)
Comparison: 10/14/2014 CT abdomen/ pelvis. Abdominal sonogram from
earlier today.

CLINICAL DATA: Epigastric abdominal pain. History of gastric bypass
surgery.

EXAM:
CT ABDOMEN AND PELVIS WITH CONTRAST
TECHNIQUE: Multidetector CT imaging of the abdomen and pelvis was performed
using the standard protocol following bolus administration of
intravenous contrast.
CONTRAST:  100mL OMNIPAQUE IOHEXOL 300 MG/ML  SOLN

[Series 2: abd_pel_with 5.0 b40f · axial · 0.82mm/px · z∈[-447,-27]mm · 13 of 96 slices shown, 15 images]
[im 6/96  soft-tissue]
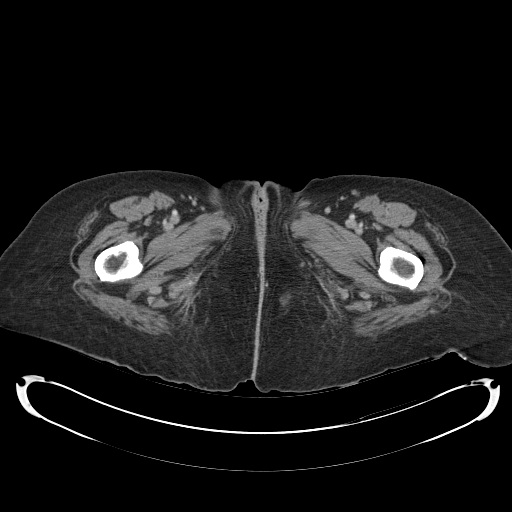
[im 6/96  bone]
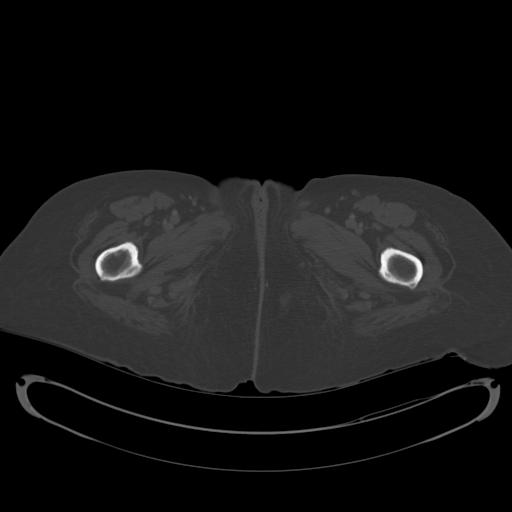
[im 11/96  soft-tissue]
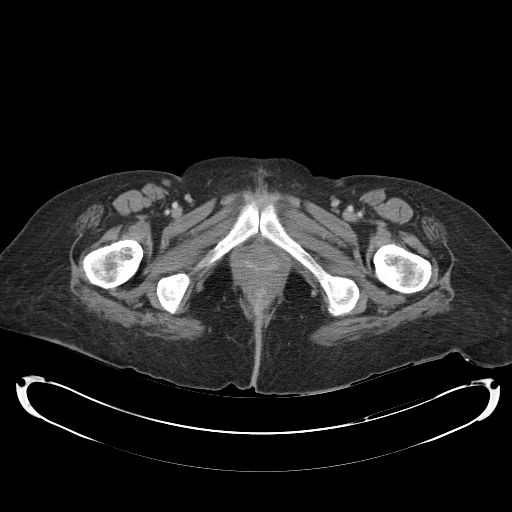
[im 22/96  soft-tissue]
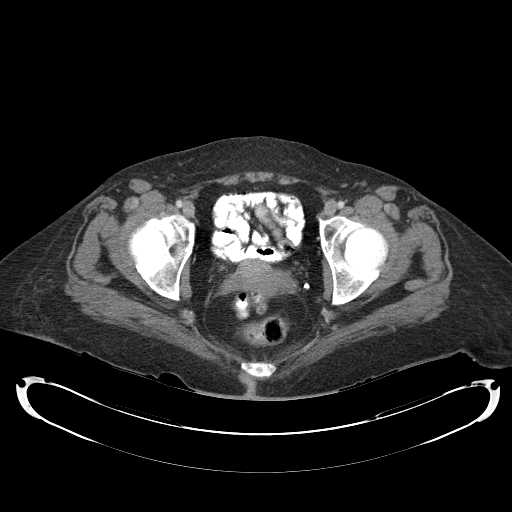
[im 27/96  soft-tissue]
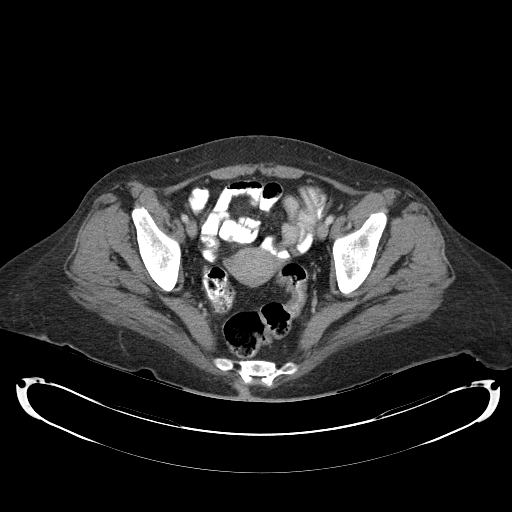
[im 32/96  soft-tissue]
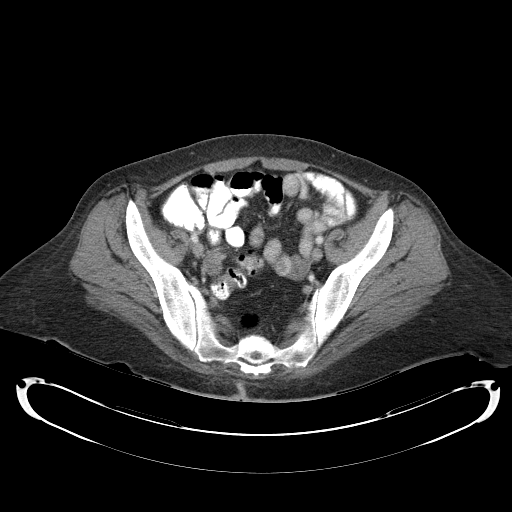
[im 43/96  soft-tissue]
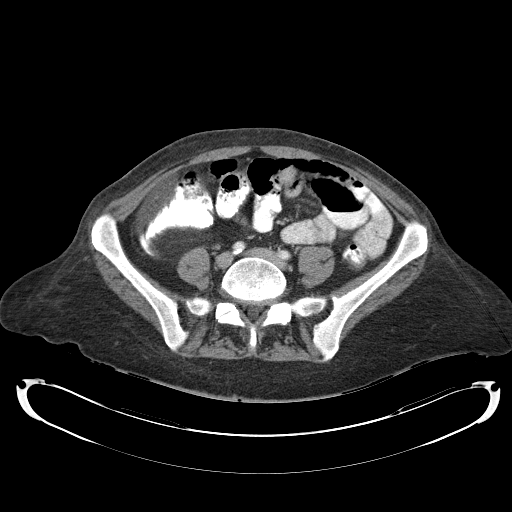
[im 48/96  soft-tissue]
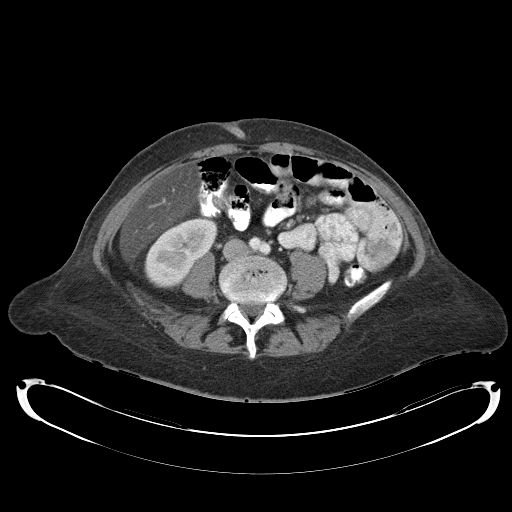
[im 53/96  soft-tissue]
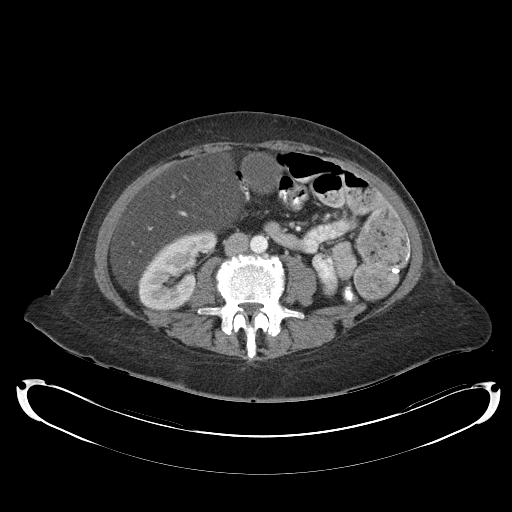
[im 64/96  soft-tissue]
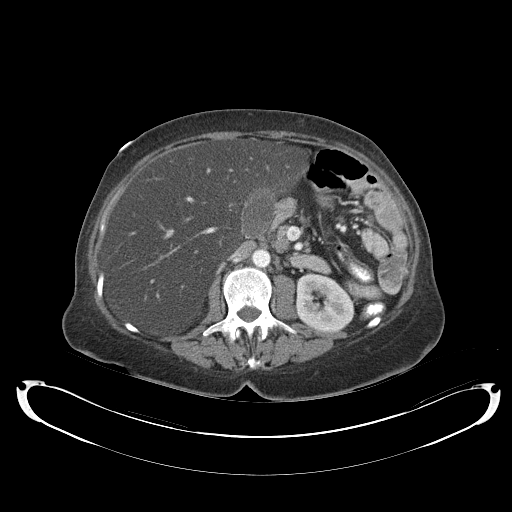
[im 64/96  bone]
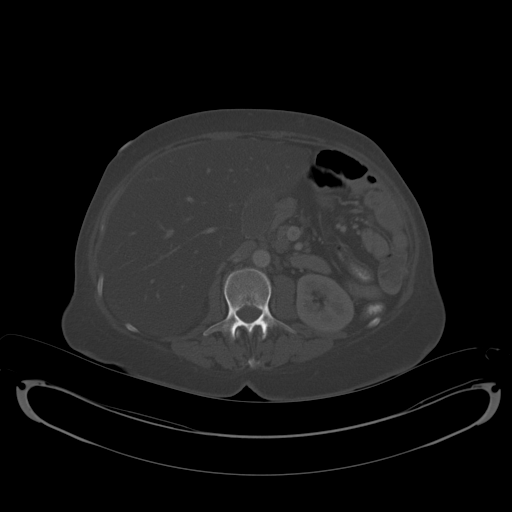
[im 69/96  soft-tissue]
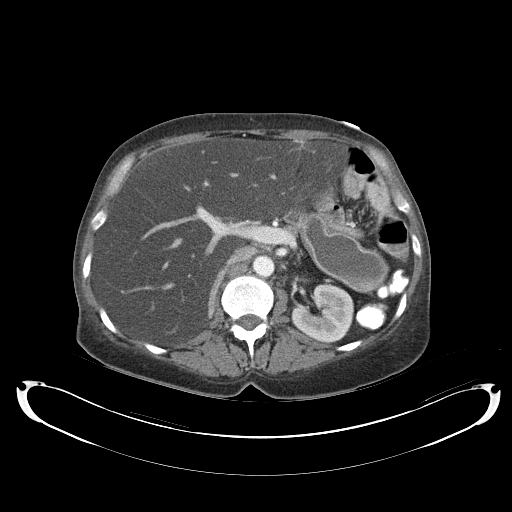
[im 74/96  soft-tissue]
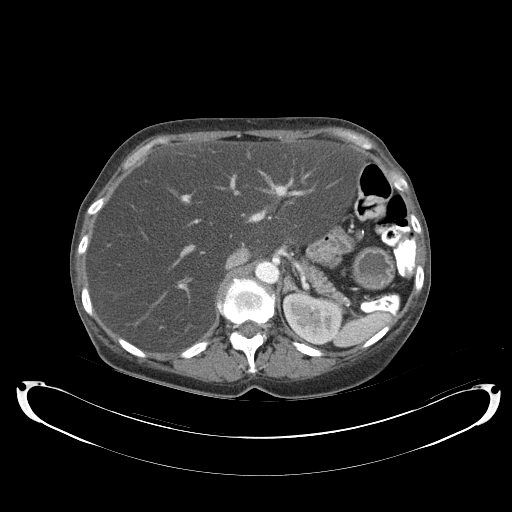
[im 85/96  soft-tissue]
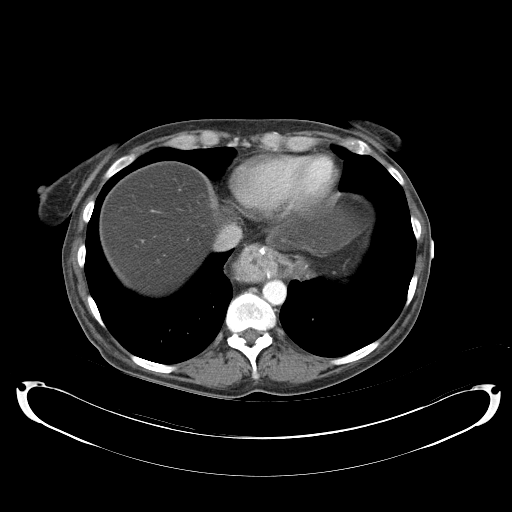
[im 90/96  soft-tissue]
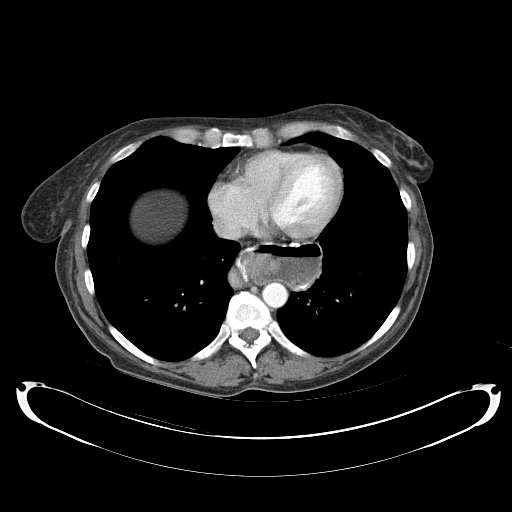

[Series 3: abd_pel_with 3.0 spo cor · coronal · 0.86mm/px · 3 of 75 slices shown]
[im 25/75  soft-tissue]
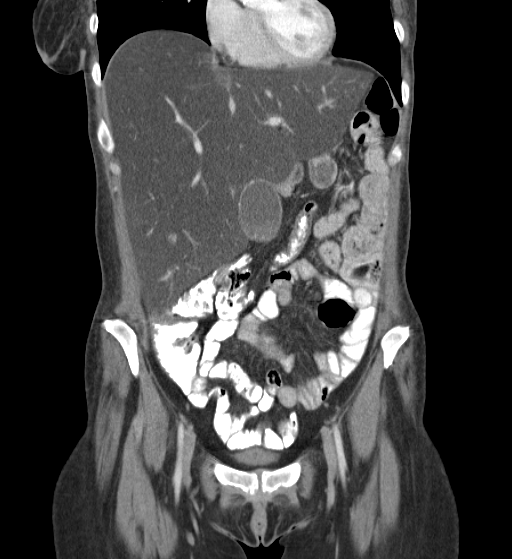
[im 33/75  soft-tissue]
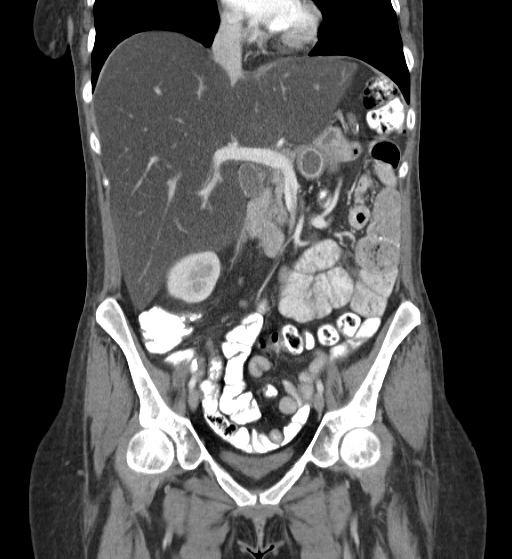
[im 42/75  soft-tissue]
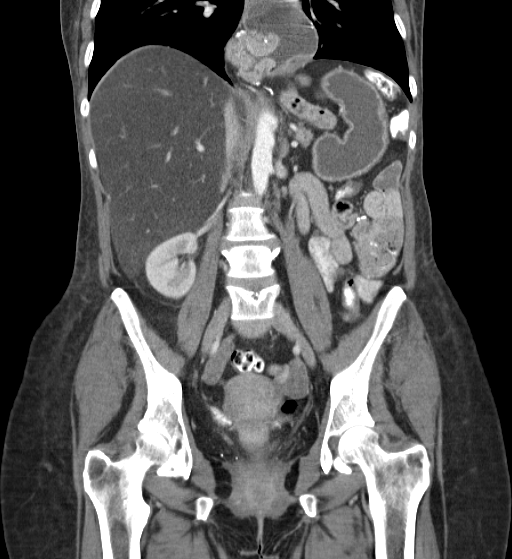

[16 of 46 positions shown; findings below may reference images not displayed]

FINDINGS: Lower chest: No significant pulmonary nodules or acute consolidative
airspace disease.

Hepatobiliary: Hepatomegaly. Severe diffuse hepatic steatosis. No
liver mass. No definite liver surface irregularity. Layering sludge
in the gallbladder with no radiopaque cholelithiasis and no definite
gallbladder wall thickening. No pericholecystic fluid. No biliary
ductal dilatation.

Pancreas: Normal, with no mass or duct dilation.

Spleen: Normal size. No mass.

Adrenals/Urinary Tract: Normal adrenals. Normal kidneys with no
hydronephrosis and no renal mass. Normal bladder.

Stomach/Bowel: The patient is status post gastric bypass surgery.
There is a moderate hiatal hernia, which contains a proximal gastric
pouch, intact appearing gastrojejunostomy and proximal jejunal limb
of the gastrojejunostomy, which is mildly distended with fluid, with
no wall thickening. The excluded distal stomach is relatively
collapsed and fluid-filled with no gastric wall thickening.
Otherwise no small bowel wall thickening or small bowel dilatation.
Normal appendix. Oral contrast progresses to the distal rectum.
Normal large bowel with no diverticulosis, large bowel wall
thickening or pericolonic fat stranding.

Vascular/Lymphatic: Normal caliber abdominal aorta. Patent portal,
splenic, hepatic and renal veins. No pathologically enlarged lymph
nodes in the abdomen or pelvis.

Reproductive: Grossly normal uterus.  No adnexal mass.

Other: No pneumoperitoneum, ascites or focal fluid collection.

Musculoskeletal: No aggressive appearing focal osseous lesions.
Moderate degenerative changes in the visualized thoracolumbar spine.
IMPRESSION: 1. Moderate hiatal hernia containing the gastric pouch,
gastrojejunostomy and proximal jejunal limb of the
gastrojejunostomy. Proximal jejunal limb within the hiatal hernia is
mildly distended with fluid, with no wall thickening.
2. No evidence of bowel obstruction or acute bowel inflammation.
Oral contrast progresses to the distal colon.
3. Hepatomegaly with severe diffuse hepatic steatosis. Recommend
correlation with clinical history to screen for alcohol abuse.

## 2017-10-10 IMAGING — US US ABDOMEN COMPLETE
1 series · 13 of 25 positions shown · non-contrast
Comparison: Prior CT scan abdomen/ pelvis 10/14/2014

CLINICAL DATA: 44-year-old female with a 2 week history of
abdominal pain

EXAM:
ABDOMEN ULTRASOUND COMPLETE

[Series 1: us abdomen complete · 0.19mm/px · 13 of 106 slices shown]
[im 1/106]
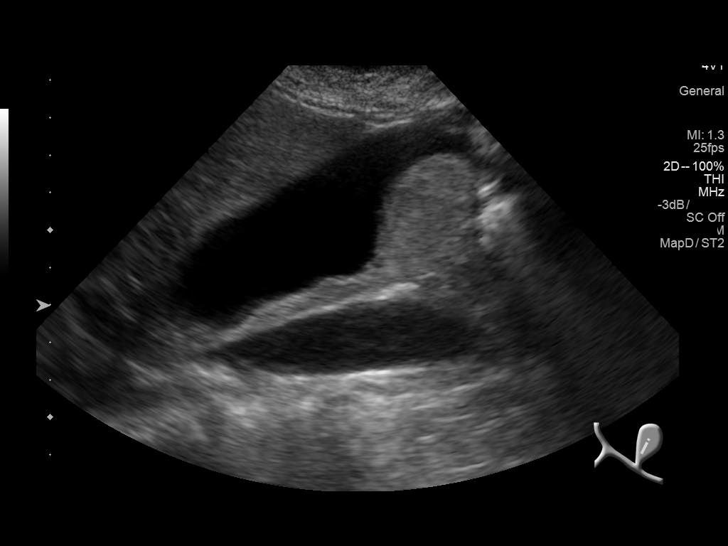
[im 9/106]
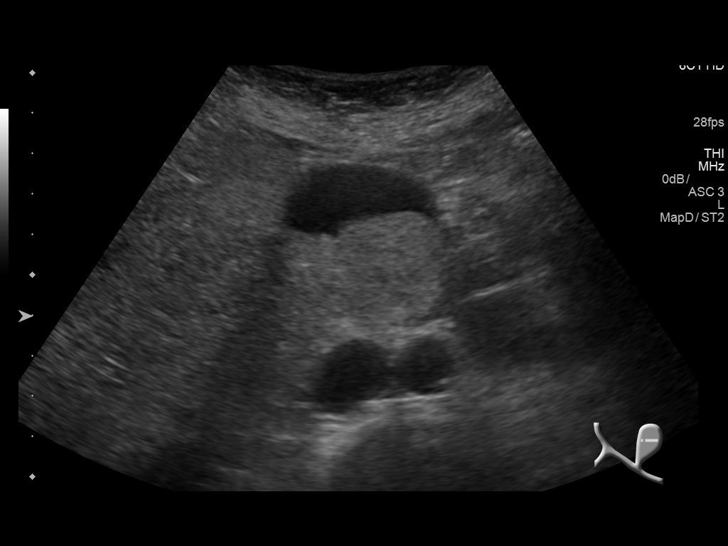
[im 18/106]
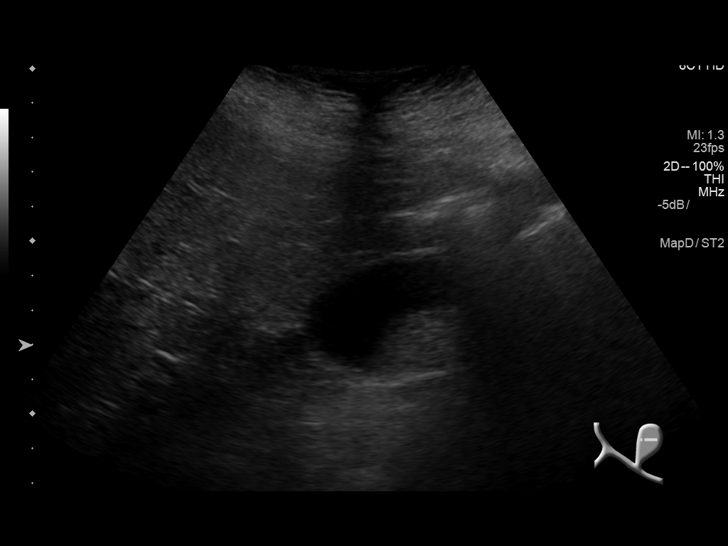
[im 27/106]
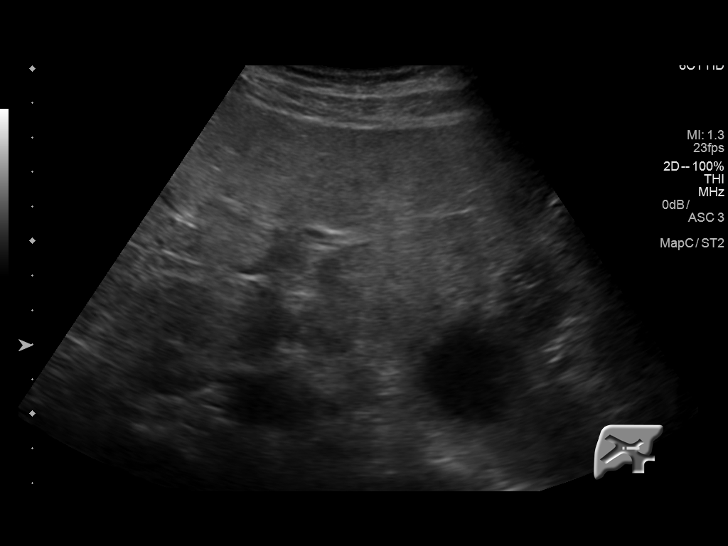
[im 36/106]
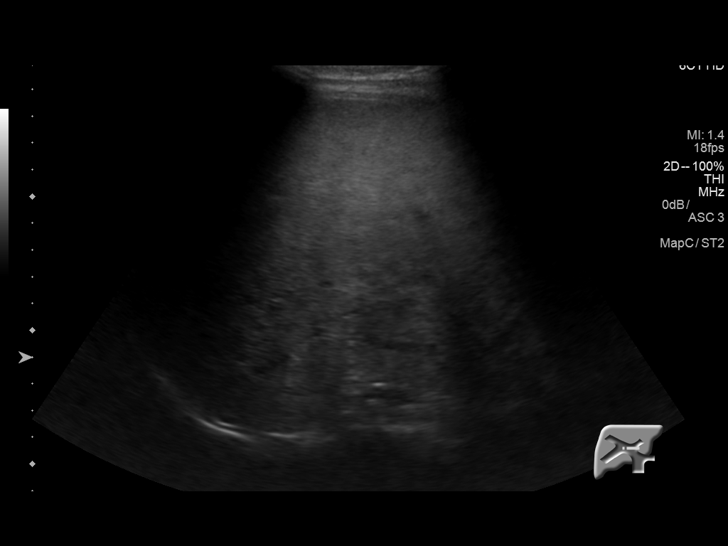
[im 44/106]
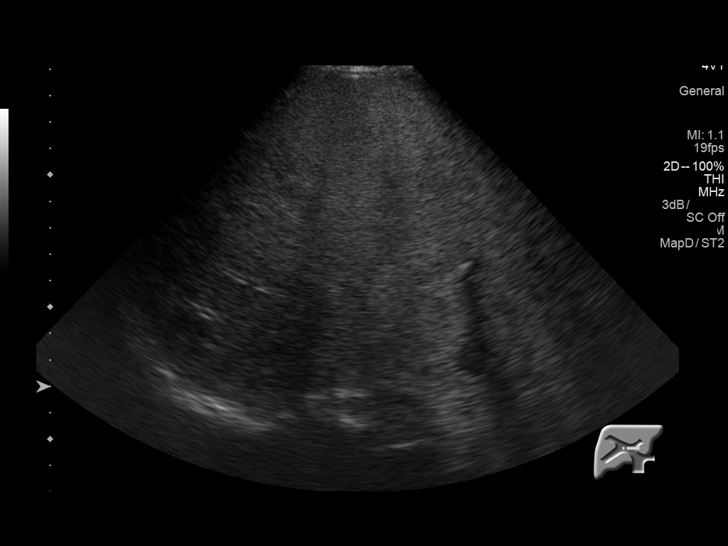
[im 53/106]
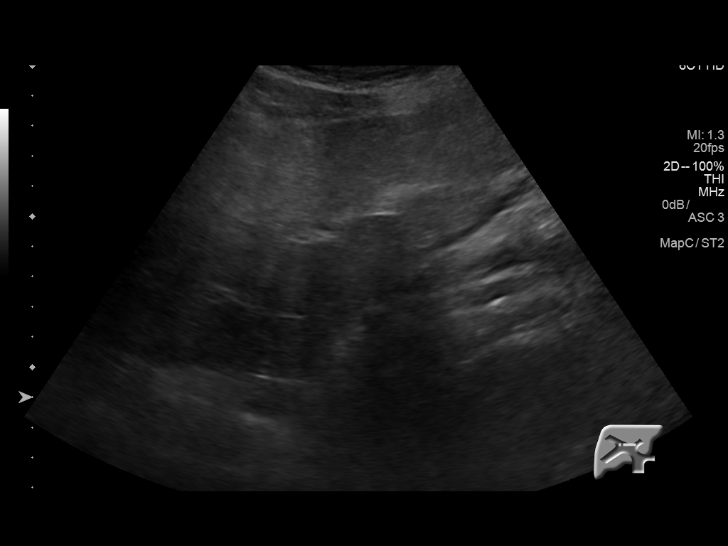
[im 62/106]
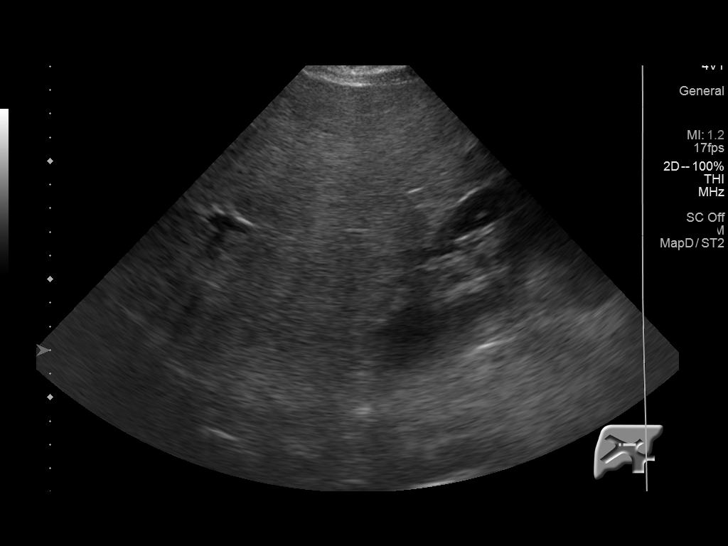
[im 71/106]
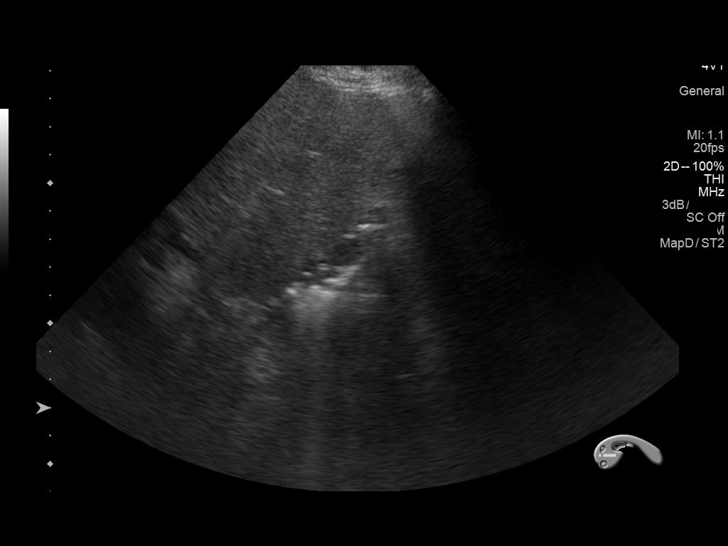
[im 79/106]
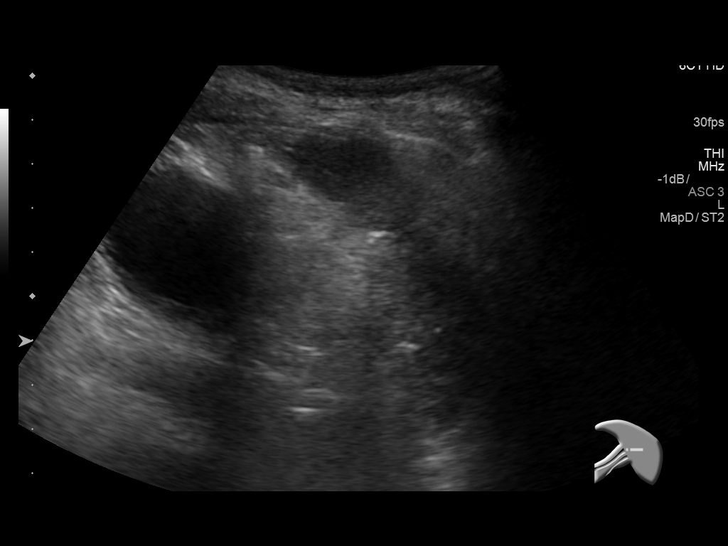
[im 88/106]
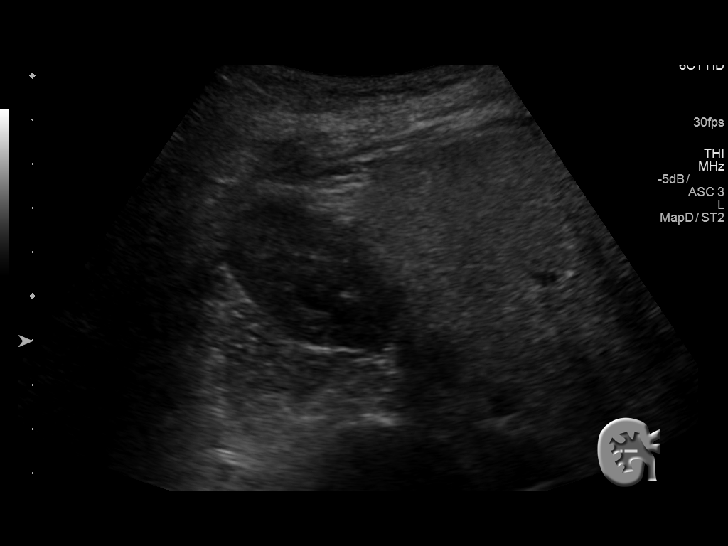
[im 97/106]
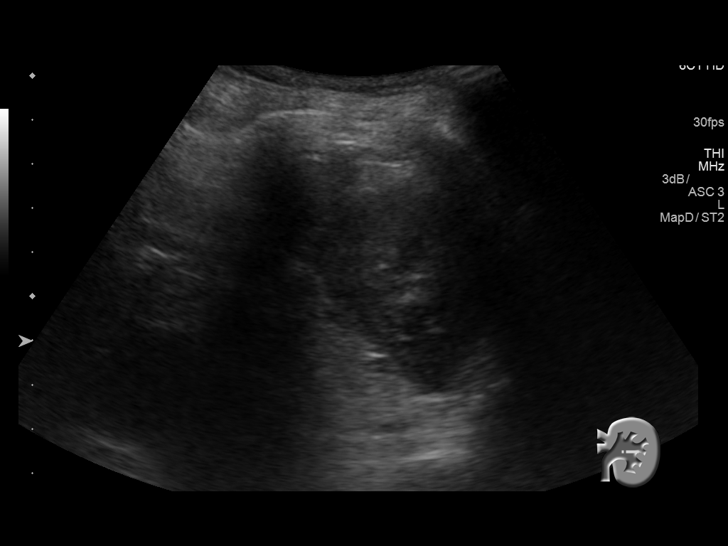
[im 106/106]
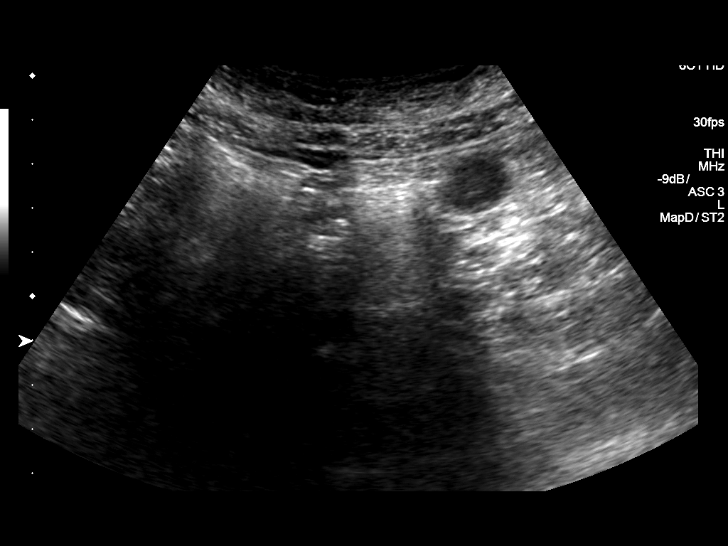

[13 of 25 positions shown; findings below may reference images not displayed]

FINDINGS: Echogenic material layers within the gallbladder lumen consistent
with gallbladder sludge. Per the sonographer, the sonographic Murphy
sign is negative. There is no gallbladder wall thickening or
evidence of pericholecystic fluid.

Common bile duct:

Diameter: Within normal limits at 4-6 mm.

Liver:

No focal lesion identified. Increased echogenicity of the hepatic
parenchyma with coarsening of the echotexture. The adjacent kidney
is hypoechoic in comparison. Findings are consistent with steatosis.
Main portal vein is patent.

IVC: No abnormality visualized.

Pancreas: Not well seen.

Spleen: Size and appearance within normal limits.

Right Kidney: Length: 10.4 cm. Echogenicity within normal limits. No
mass or hydronephrosis visualized.

Left Kidney: Length: 10.4 cm. Echogenicity within normal limits. No
mass or hydronephrosis visualized.

Abdominal aorta: No aneurysm visualized.

Other findings: None.
IMPRESSION: 1. Gallbladder sludge. No secondary sonographic findings of acute
cholecystitis.
2. Hepatic steatosis.
